# Patient Record
Sex: Female | Born: 1975
Health system: Southern US, Community
[De-identification: ages and names within clinical notes are randomized; demographics above are authoritative.]

## PROBLEM LIST (undated history)

## (undated) DIAGNOSIS — K648 Other hemorrhoids: Secondary | ICD-10-CM

## (undated) DIAGNOSIS — E669 Obesity, unspecified: Secondary | ICD-10-CM

## (undated) DIAGNOSIS — D219 Benign neoplasm of connective and other soft tissue, unspecified: Secondary | ICD-10-CM

## (undated) DIAGNOSIS — E119 Type 2 diabetes mellitus without complications: Secondary | ICD-10-CM

## (undated) DIAGNOSIS — R519 Headache, unspecified: Secondary | ICD-10-CM

## (undated) DIAGNOSIS — I10 Essential (primary) hypertension: Secondary | ICD-10-CM

## (undated) DIAGNOSIS — D649 Anemia, unspecified: Secondary | ICD-10-CM

## (undated) DIAGNOSIS — M1990 Unspecified osteoarthritis, unspecified site: Secondary | ICD-10-CM

## (undated) DIAGNOSIS — E785 Hyperlipidemia, unspecified: Secondary | ICD-10-CM

## (undated) DIAGNOSIS — G4730 Sleep apnea, unspecified: Secondary | ICD-10-CM

## (undated) DIAGNOSIS — E539 Vitamin B deficiency, unspecified: Secondary | ICD-10-CM

## (undated) DIAGNOSIS — L509 Urticaria, unspecified: Secondary | ICD-10-CM

## (undated) DIAGNOSIS — K219 Gastro-esophageal reflux disease without esophagitis: Secondary | ICD-10-CM

## (undated) HISTORY — DX: Hyperlipidemia, unspecified: E78.5

## (undated) HISTORY — DX: Essential (primary) hypertension: I10

## (undated) HISTORY — DX: Obesity, unspecified: E66.9

## (undated) HISTORY — DX: Urticaria, unspecified: L50.9

## (undated) HISTORY — DX: Benign neoplasm of connective and other soft tissue, unspecified: D21.9

## (undated) HISTORY — DX: Anemia, unspecified: D64.9

## (undated) HISTORY — PX: OTHER SURGICAL HISTORY: SHX169

## (undated) HISTORY — PX: TUBAL LIGATION: SHX77

## (undated) HISTORY — DX: Sleep apnea, unspecified: G47.30

---

## 1998-12-20 HISTORY — PX: OTHER SURGICAL HISTORY: SHX169

## 2000-11-18 ENCOUNTER — Other Ambulatory Visit: Admission: RE | Admit: 2000-11-18 | Discharge: 2000-11-18 | Payer: Self-pay | Admitting: Obstetrics & Gynecology

## 2000-11-19 HISTORY — PX: DILATION AND CURETTAGE OF UTERUS: SHX78

## 2000-11-26 ENCOUNTER — Ambulatory Visit (HOSPITAL_COMMUNITY): Admission: RE | Admit: 2000-11-26 | Discharge: 2000-11-26 | Payer: Self-pay | Admitting: Obstetrics and Gynecology

## 2001-07-12 ENCOUNTER — Other Ambulatory Visit: Admission: RE | Admit: 2001-07-12 | Discharge: 2001-07-12 | Payer: Self-pay | Admitting: Obstetrics and Gynecology

## 2001-09-27 ENCOUNTER — Inpatient Hospital Stay (HOSPITAL_COMMUNITY): Admission: AD | Admit: 2001-09-27 | Discharge: 2001-09-27 | Payer: Self-pay | Admitting: Obstetrics and Gynecology

## 2002-02-03 ENCOUNTER — Inpatient Hospital Stay (HOSPITAL_COMMUNITY): Admission: AD | Admit: 2002-02-03 | Discharge: 2002-02-03 | Payer: Self-pay | Admitting: Obstetrics and Gynecology

## 2002-03-10 ENCOUNTER — Inpatient Hospital Stay (HOSPITAL_COMMUNITY): Admission: AD | Admit: 2002-03-10 | Discharge: 2002-03-12 | Payer: Self-pay | Admitting: Obstetrics and Gynecology

## 2002-12-20 HISTORY — PX: TUBAL LIGATION: SHX77

## 2003-05-30 ENCOUNTER — Other Ambulatory Visit: Admission: RE | Admit: 2003-05-30 | Discharge: 2003-05-30 | Payer: Self-pay | Admitting: Obstetrics and Gynecology

## 2003-12-07 ENCOUNTER — Inpatient Hospital Stay (HOSPITAL_COMMUNITY): Admission: AD | Admit: 2003-12-07 | Discharge: 2003-12-09 | Payer: Self-pay | Admitting: Obstetrics and Gynecology

## 2003-12-11 ENCOUNTER — Inpatient Hospital Stay (HOSPITAL_COMMUNITY): Admission: AD | Admit: 2003-12-11 | Discharge: 2003-12-11 | Payer: Self-pay | Admitting: Obstetrics and Gynecology

## 2004-01-18 ENCOUNTER — Ambulatory Visit (HOSPITAL_COMMUNITY): Admission: RE | Admit: 2004-01-18 | Discharge: 2004-01-18 | Payer: Self-pay | Admitting: Obstetrics and Gynecology

## 2006-02-16 ENCOUNTER — Other Ambulatory Visit: Admission: RE | Admit: 2006-02-16 | Discharge: 2006-02-16 | Payer: Self-pay | Admitting: Obstetrics and Gynecology

## 2012-03-29 ENCOUNTER — Encounter (INDEPENDENT_AMBULATORY_CARE_PROVIDER_SITE_OTHER): Payer: Self-pay | Admitting: Surgery

## 2012-04-19 ENCOUNTER — Encounter (INDEPENDENT_AMBULATORY_CARE_PROVIDER_SITE_OTHER): Payer: Self-pay | Admitting: Surgery

## 2012-04-20 ENCOUNTER — Ambulatory Visit (INDEPENDENT_AMBULATORY_CARE_PROVIDER_SITE_OTHER): Payer: 59 | Admitting: Surgery

## 2012-04-20 ENCOUNTER — Encounter (INDEPENDENT_AMBULATORY_CARE_PROVIDER_SITE_OTHER): Payer: Self-pay | Admitting: Surgery

## 2012-04-20 VITALS — BP 123/82 | HR 74 | Temp 98.6°F | Resp 18 | Ht 64.0 in | Wt 187.2 lb

## 2012-04-20 DIAGNOSIS — K429 Umbilical hernia without obstruction or gangrene: Secondary | ICD-10-CM

## 2012-04-20 NOTE — Progress Notes (Signed)
Patient ID: Samantha Ball, female   DOB: Nov 02, 1976, 37 y.o.   MRN: [id number]  Chief Complaint  Patient presents with  . Umbilical Hernia    HPI Samantha Ball is a 36 y.o. female.   HPIThis is a very pleasant female referred by Dr. Parke Simmers for evaluation of a symptomatic umbilical hernia. She has had a hernia for many years. It is now getting larger and causing her to have increasing discomfort as well as nausea. She may have had an episode of vomiting as well. She does report that easily reduces. The pain is moderate in intensity. She has no complaints  Past Medical History  Diagnosis Date  . Hypertension   . Hyperlipidemia     Past Surgical History  Procedure Date  . Tubal ligation   . Wisdom teeth 2000    History reviewed. No pertinent family history.  Social History History  Substance Use Topics  . Smoking status: Never Smoker   . Smokeless tobacco: Not on file  . Alcohol Use: No    No Known Allergies  Current Outpatient Prescriptions  Medication Sig Dispense Refill  . Acetaminophen (TYLENOL 8 HOUR PO) Take by mouth.      Marland Kitchen aspirin-acetaminophen-caffeine (EXCEDRIN MIGRAINE) 250-250-65 MG per tablet Take 1 tablet by mouth every 6 (six) hours as needed.      . cholecalciferol (VITAMIN D) 400 UNITS TABS Take 5,000 Units by mouth.      . EXFORGE HCT 5-160-12.5 MG TABS       . Ibuprofen (MOTRIN PO) Take by mouth.      . valsartan-hydrochlorothiazide (DIOVAN-HCT) 80-12.5 MG per tablet Take 1 tablet by mouth daily. 1/2 daily      . Vitamin D, Ergocalciferol, (DRISDOL) 50000 UNITS CAPS         Review of Systems Review of Systems  Constitutional: Negative for fever, chills and unexpected weight change.  HENT: Negative for hearing loss, congestion, sore throat, trouble swallowing and voice change.   Eyes: Negative for visual disturbance.  Respiratory: Negative for cough and wheezing.   Cardiovascular: Negative for chest pain, palpitations and leg swelling.    Gastrointestinal: Positive for nausea, abdominal pain and constipation. Negative for vomiting, diarrhea, blood in stool, abdominal distention and anal bleeding.  Genitourinary: Negative for hematuria, vaginal bleeding and difficulty urinating.  Musculoskeletal: Negative for arthralgias.  Skin: Negative for rash and wound.  Neurological: Negative for seizures, syncope and headaches.  Hematological: Negative for adenopathy. Does not bruise/bleed easily.  Psychiatric/Behavioral: Negative for confusion.    Blood pressure 123/82, pulse 74, temperature 98.6 F (37 C), temperature source Temporal, resp. rate 18, height 5\' 4"  (1.626 m), weight 187 lb 3.2 oz (84.913 kg).  Physical Exam Physical Exam  Constitutional: She is oriented to person, place, and time. She appears well-developed and well-nourished. No distress.  HENT:  Head: Normocephalic and atraumatic.  Right Ear: External ear normal.  Left Ear: External ear normal.  Nose: Nose normal.  Mouth/Throat: Oropharynx is clear and moist. No oropharyngeal exudate.  Eyes: Conjunctivae are normal. Pupils are equal, round, and reactive to light. Right eye exhibits no discharge. Left eye exhibits no discharge. No scleral icterus.  Neck: Normal range of motion. Neck supple. No tracheal deviation present. No thyromegaly present.  Cardiovascular: Normal rate, regular rhythm, normal heart sounds and intact distal pulses.   No murmur heard. Pulmonary/Chest: Effort normal and breath sounds normal. No respiratory distress. She has no wheezes.  Abdominal: Soft. Bowel sounds are normal. She exhibits no  distension. There is no tenderness. There is no rebound.       There is a well-healed incision just below the umbilicus. There is a reducible hernia at the umbilicus.  Musculoskeletal: Normal range of motion. She exhibits no edema and no tenderness.  Lymphadenopathy:    She has no cervical adenopathy.  Neurological: She is alert and oriented to person,  place, and time.  Skin: Skin is warm and dry. No rash noted. She is not diaphoretic. No erythema.  Psychiatric: Her behavior is normal. Judgment normal.    Data Reviewed   Assessment    Umbilical hernia    Plan    Repair with mesh was recommended. I discussed this with her in detail. I discussed the risk of surgery which includes but is not limited to bleeding, infection, recurrence, need for further surgery, chronic pain, et Karie Soda. She understands and wishes to proceed. Likelihood of success is good.       Consuelo Thayne A 04/20/2012, 1:41 PM

## 2012-04-26 ENCOUNTER — Encounter (INDEPENDENT_AMBULATORY_CARE_PROVIDER_SITE_OTHER): Payer: Self-pay

## 2012-06-16 ENCOUNTER — Encounter (HOSPITAL_COMMUNITY): Admission: RE | Payer: Self-pay | Source: Ambulatory Visit

## 2012-06-16 ENCOUNTER — Ambulatory Visit (HOSPITAL_COMMUNITY): Admission: RE | Admit: 2012-06-16 | Payer: 59 | Source: Ambulatory Visit | Admitting: Surgery

## 2012-06-16 SURGERY — REPAIR, HERNIA, UMBILICAL, ADULT
Anesthesia: General

## 2012-07-11 ENCOUNTER — Encounter (INDEPENDENT_AMBULATORY_CARE_PROVIDER_SITE_OTHER): Payer: 59 | Admitting: Surgery

## 2012-07-25 ENCOUNTER — Encounter (INDEPENDENT_AMBULATORY_CARE_PROVIDER_SITE_OTHER): Payer: 59 | Admitting: Surgery

## 2012-11-02 LAB — HM PAP SMEAR: HM Pap smear: NORMAL

## 2012-11-20 ENCOUNTER — Other Ambulatory Visit: Payer: Self-pay | Admitting: Family Medicine

## 2012-11-20 ENCOUNTER — Ambulatory Visit
Admission: RE | Admit: 2012-11-20 | Discharge: 2012-11-20 | Disposition: A | Discharge status: Home / Self Care | Payer: 59 | Source: Ambulatory Visit | Attending: Family Medicine | Admitting: Family Medicine

## 2012-11-20 DIAGNOSIS — M1990 Unspecified osteoarthritis, unspecified site: Secondary | ICD-10-CM

## 2012-12-04 ENCOUNTER — Ambulatory Visit: Payer: Self-pay | Admitting: Obstetrics and Gynecology

## 2013-01-23 ENCOUNTER — Encounter: Payer: Self-pay | Admitting: Obstetrics and Gynecology

## 2013-01-23 ENCOUNTER — Ambulatory Visit: Payer: 59 | Admitting: Obstetrics and Gynecology

## 2013-01-23 VITALS — BP 110/58 | Ht 63.75 in | Wt 190.0 lb

## 2013-01-23 DIAGNOSIS — Z124 Encounter for screening for malignant neoplasm of cervix: Secondary | ICD-10-CM

## 2013-01-23 DIAGNOSIS — R351 Nocturia: Secondary | ICD-10-CM

## 2013-01-23 DIAGNOSIS — Z01419 Encounter for gynecological examination (general) (routine) without abnormal findings: Secondary | ICD-10-CM

## 2013-01-23 LAB — POCT URINALYSIS DIPSTICK
Bilirubin, UA: NEGATIVE
Blood, UA: NEGATIVE
Glucose, UA: NEGATIVE
Ketones, UA: NEGATIVE
Leukocytes, UA: NEGATIVE
Nitrite, UA: NEGATIVE
Protein, UA: NEGATIVE
Spec Grav, UA: <=1.005
Urobilinogen, UA: NEGATIVE
pH, UA: 8

## 2013-01-23 MED ORDER — SULFAMETHOXAZOLE-TRIMETHOPRIM 800-160 MG PO TABS: 1.0000 | ORAL_TABLET | Freq: Two times a day (BID) | ORAL | Status: AC

## 2013-01-23 NOTE — Progress Notes (Signed)
ANNUAL GYNECOLOGIC EXAMINATION   Samantha Ball is a 37 y.o. female, G3P2, who presents for an annual exam. The patient has not been seen since 2010 and therefore this was considered a new patient exam.  She is status post tubal ligation.  She says she has nocturia.    History   Social History  . Marital Status: Married    Spouse Name: N/A    Number of Children: N/A  . Years of Education: N/A   Social History Main Topics  . Smoking status: Never Smoker   . Smokeless tobacco: Never Used  . Alcohol Use: No  . Drug Use: No  . Sexually Active: Yes    Birth Control/ Protection: Surgical     Comment: BTL   Other Topics Concern  . None   Social History Narrative  . None    Menstrual cycle:   LMP: Patient's last menstrual period was 01/04/2013.             The following portions of the patient's history were reviewed and updated as appropriate: allergies, current medications, past family history, past medical history, past social history, past surgical history and problem list.  Review of Systems Pertinent items are noted in HPI. Breast:Negative for breast lump,nipple discharge or nipple retraction Gastrointestinal: Negative for abdominal pain, change in bowel habits or rectal bleeding Urinary: nocturia   Objective:    BP 110/58  Ht 5' 3.75" (1.619 m)  Wt 190 lb (86.183 kg)  BMI 32.87 kg/m2  LMP 01/04/2013    Weight:  Wt Readings from Last 1 Encounters:  01/23/13 190 lb (86.183 kg)          BMI: Body mass index is 32.87 kg/(m^2).  General Appearance: Alert, appropriate appearance for age. No acute distress HEENT: Grossly normal Neck / Thyroid: Supple, no masses, nodes or enlargement Lungs: clear to auscultation bilaterally Back: No CVA tenderness Breast Exam: No masses or nodes.No dimpling, nipple retraction or discharge. Cardiovascular: Regular rate and rhythm. S1, S2, no murmur Gastrointestinal: Soft, non-tender, no masses or  organomegaly  ++++++++++++++++++++++++++++++++++++++++++++++++++++++++  Pelvic Exam: External genitalia: normal general appearance Vaginal: normal without tenderness, induration or masses. Relaxation: Yes Cervix: normal appearance Adnexa: normal bimanual exam Uterus: normal size, shape, and consistency Rectovaginal: normal rectal, no masses  ++++++++++++++++++++++++++++++++++++++++++++++++++++++++  Lymphatic Exam: Non-palpable nodes in neck, clavicular, axillary, or inguinal regions Neurologic: Normal speech, no tremor  Psychiatric: Alert and oriented, appropriate affect.   Urinalysis:  Assessment:    Normal gyn exam   Overweight or obese: Yes   Pelvic relaxation: Yes  nocturia   Plan:    pap smear return annually or prn Contraception:bilateral tubal ligation    Medications prescribed: Septra DS 1 tablet twice a day for 3 days  Urine culture sent  STD screen request: No   The updated Pap smear screening guidelines were discussed with the patient. The patient requested that I obtain a Pap smear: Yes.  Kegel exercises discussed: Yes.  Proper diet and regular exercise were reviewed.  Annual mammograms recommended starting at age 88. Proper breast care was discussed.  Screening colonoscopy is recommended beginning at age 12.  Regular health maintenance was reviewed.  Sleep hygiene was discussed.  Adequate calcium and vitamin D intake was emphasized.  Leonard Schwartz M.D.    Regular Periods: yes Mammogram: no  Monthly Breast Ex.: no Exercise: yes  Tetanus < 10 years: no Seatbelts: yes  NI. Bladder Functn.: yes Abuse at home: no  Daily BM's: yes Stressful  Work: no  Healthy Diet: no Sigmoid-Colonoscopy: per pt 2008 Normal  Calcium: no Medical problems this year: none    LAST PAP:10/15/2009 Contraception: BTL   Mammogram:  n/a  PCP: Dr Renaye Rakers   PMH: none   FMH: none   Last Bone Scan: none

## 2013-01-24 LAB — PAP IG W/ RFLX HPV ASCU

## 2013-01-25 LAB — URINE CULTURE
Colony Count: NO GROWTH
Organism ID, Bacteria: NO GROWTH

## 2013-07-11 ENCOUNTER — Ambulatory Visit (INDEPENDENT_AMBULATORY_CARE_PROVIDER_SITE_OTHER): Payer: 59

## 2013-07-11 ENCOUNTER — Encounter: Payer: Self-pay | Admitting: Internal Medicine

## 2013-07-11 ENCOUNTER — Ambulatory Visit (INDEPENDENT_AMBULATORY_CARE_PROVIDER_SITE_OTHER): Payer: 59 | Admitting: Internal Medicine

## 2013-07-11 VITALS — BP 140/96 | HR 73 | Temp 98.2°F | Resp 16 | Ht 64.0 in | Wt 193.0 lb

## 2013-07-11 DIAGNOSIS — M2550 Pain in unspecified joint: Secondary | ICD-10-CM

## 2013-07-11 DIAGNOSIS — E669 Obesity, unspecified: Secondary | ICD-10-CM | POA: Insufficient documentation

## 2013-07-11 DIAGNOSIS — Z0000 Encounter for general adult medical examination without abnormal findings: Secondary | ICD-10-CM

## 2013-07-11 DIAGNOSIS — IMO0001 Reserved for inherently not codable concepts without codable children: Secondary | ICD-10-CM | POA: Insufficient documentation

## 2013-07-11 DIAGNOSIS — I10 Essential (primary) hypertension: Secondary | ICD-10-CM | POA: Insufficient documentation

## 2013-07-11 DIAGNOSIS — Z1231 Encounter for screening mammogram for malignant neoplasm of breast: Secondary | ICD-10-CM | POA: Insufficient documentation

## 2013-07-11 LAB — COMPREHENSIVE METABOLIC PANEL
ALT: 18 U/L (ref 0–35)
AST: 18 U/L (ref 0–37)
Albumin: 4.1 g/dL (ref 3.5–5.2)
Alkaline Phosphatase: 41 U/L (ref 39–117)
BUN: 9 mg/dL (ref 6–23)
CO2: 24 mEq/L (ref 19–32)
Calcium: 9.9 mg/dL (ref 8.4–10.5)
Chloride: 106 mEq/L (ref 96–112)
Creatinine, Ser: 0.8 mg/dL (ref 0.4–1.2)
GFR: 103.45 mL/min (ref >60.00)
Glucose, Bld: 85 mg/dL (ref 70–99)
Potassium: 4 mEq/L (ref 3.5–5.1)
Sodium: 137 mEq/L (ref 135–145)
Total Bilirubin: 0.6 mg/dL (ref 0.3–1.2)
Total Protein: 7.9 g/dL (ref 6.0–8.3)

## 2013-07-11 LAB — LIPID PANEL
Cholesterol: 180 mg/dL (ref 0–200)
HDL: 51.8 mg/dL (ref >39.00)
LDL Cholesterol: 115 mg/dL — ABNORMAL HIGH (ref 0–99)
Total CHOL/HDL Ratio: 3
Triglycerides: 67 mg/dL (ref 0.0–149.0)
VLDL: 13.4 mg/dL (ref 0.0–40.0)

## 2013-07-11 LAB — CBC WITH DIFFERENTIAL/PLATELET
Basophils Absolute: 0 10*3/uL (ref 0.0–0.1)
Basophils Relative: 0.5 % (ref 0.0–3.0)
Eosinophils Absolute: 0.1 10*3/uL (ref 0.0–0.7)
Eosinophils Relative: 1.5 % (ref 0.0–5.0)
HCT: 36 % (ref 36.0–46.0)
Hemoglobin: 12 g/dL (ref 12.0–15.0)
Lymphocytes Relative: 38.5 % (ref 12.0–46.0)
Lymphs Abs: 2.2 10*3/uL (ref 0.7–4.0)
MCHC: 33.4 g/dL (ref 30.0–36.0)
MCV: 82 fl (ref 78.0–100.0)
Monocytes Absolute: 0.4 10*3/uL (ref 0.1–1.0)
Monocytes Relative: 7.4 % (ref 3.0–12.0)
Neutro Abs: 3 10*3/uL (ref 1.4–7.7)
Neutrophils Relative %: 52.1 % (ref 43.0–77.0)
Platelets: 302 10*3/uL (ref 150.0–400.0)
RBC: 4.39 Mil/uL (ref 3.87–5.11)
RDW: 17 % — ABNORMAL HIGH (ref 11.5–14.6)
WBC: 5.8 10*3/uL (ref 4.5–10.5)

## 2013-07-11 LAB — C-REACTIVE PROTEIN: CRP: <0.5 mg/dL (ref 0.5–20.0)

## 2013-07-11 LAB — TSH: TSH: 1.01 u[IU]/mL (ref 0.35–5.50)

## 2013-07-11 LAB — RHEUMATOID FACTOR: Rhuematoid fact SerPl-aCnc: <10 IU/mL (ref <=14)

## 2013-07-11 LAB — SEDIMENTATION RATE: Sed Rate: 16 mm/hr (ref 0–22)

## 2013-07-11 MED ORDER — HYDROCHLOROTHIAZIDE 12.5 MG PO CAPS: 12.5000 mg | ORAL_CAPSULE | Freq: Every day | ORAL | Status: DC

## 2013-07-11 NOTE — Assessment & Plan Note (Signed)
Exam done Vaccines were reviewed Labs ordered Pt ed material was given 

## 2013-07-11 NOTE — Assessment & Plan Note (Signed)
She is working on her lifestyle modifications to lose weight. 

## 2013-07-11 NOTE — Progress Notes (Signed)
  Subjective:    Patient ID: Samantha Ball, female    DOB: [birth year], 37 y.o.   MRN: [id number]  HPI Comments: New to me, transfer [facility] - no records are available today.  Arthritis Presents for follow-up visit. She complains of pain. She reports no stiffness, joint swelling or joint warmth. The symptoms have been stable. Affected locations include the right wrist, left wrist, left MCP, right MCP, right knee, left knee, left ankle and right ankle. Her pain is at a severity of 2/10. Pertinent negatives include no diarrhea, dry eyes, dry mouth, dysuria, fatigue, fever, pain at night, pain while resting, rash, Raynaud's syndrome, uveitis or weight loss.      Review of Systems  Constitutional: Negative.  Negative for fever, chills, weight loss, diaphoresis, activity change, appetite change, fatigue and unexpected weight change.  HENT: Negative.   Eyes: Negative.   Respiratory: Negative.  Negative for cough, chest tightness, shortness of breath, wheezing and stridor.   Cardiovascular: Negative.  Negative for chest pain, palpitations and leg swelling.  Gastrointestinal: Positive for constipation. Negative for nausea, vomiting, abdominal pain and diarrhea.  Endocrine: Negative.   Genitourinary: Negative.  Negative for dysuria.  Musculoskeletal: Positive for arthritis. Negative for myalgias, back pain, joint swelling, gait problem and stiffness.  Skin: Negative.  Negative for rash.  Allergic/Immunologic: Negative.   Neurological: Negative.   Hematological: Negative.  Negative for adenopathy. Does not bruise/bleed easily.  Psychiatric/Behavioral: Negative.        Objective:   Physical Exam  Vitals reviewed. Constitutional: She is oriented to person, place, and time. She appears well-developed and well-nourished. No distress.  HENT:  Head: Normocephalic and atraumatic.  Mouth/Throat: Oropharynx is clear and moist. No oropharyngeal exudate.  Eyes: Conjunctivae are normal. Right  eye exhibits no discharge. Left eye exhibits no discharge. No scleral icterus.  Neck: Normal range of motion. Neck supple. No JVD present. No tracheal deviation present. No thyromegaly present.  Cardiovascular: Normal rate, regular rhythm, normal heart sounds and intact distal pulses.  Exam reveals no gallop and no friction rub.   No murmur heard. Pulmonary/Chest: Effort normal and breath sounds normal. No stridor. No respiratory distress. She has no wheezes. She has no rales. She exhibits no tenderness.  Abdominal: Soft. Bowel sounds are normal. She exhibits no distension and no mass. There is no tenderness. There is no rebound and no guarding.  Musculoskeletal: Normal range of motion. She exhibits no edema and no tenderness.  There is mild warmth over both wrists but no effusions or swelling. All other joints are cool with FROM and no swelling, crepitance, warmth.  Lymphadenopathy:    She has no cervical adenopathy.  Neurological: She is oriented to person, place, and time.  Skin: Skin is warm and dry. No rash noted. She is not diaphoretic. No erythema. No pallor.  Psychiatric: She has a normal mood and affect. Her behavior is normal. Judgment and thought content normal.    No results found for this basename: WBC, HGB, HCT, PLT, GLUCOSE, CHOL, TRIG, HDL, LDLDIRECT, LDLCALC, ALT, AST, NA, K, CL, CREATININE, BUN, CO2, TSH, PSA, INR, GLUF, HGBA1C, MICROALBUR        Assessment & Plan:

## 2013-07-11 NOTE — Patient Instructions (Signed)
Preventive Care for Adults, Female A healthy lifestyle and preventive care can promote health and wellness. Preventive health guidelines for women include the following key practices.  A routine yearly physical is a good way to check with your caregiver about your health and preventive screening. It is a chance to share any concerns and updates on your health, and to receive a thorough exam.  Visit your dentist for a routine exam and preventive care every 6 months. Brush your teeth twice a day and floss once a day. Good oral hygiene prevents tooth decay and gum disease.  The frequency of eye exams is based on your age, health, family medical history, use of contact lenses, and other factors. Follow your caregiver's recommendations for frequency of eye exams.  Eat a healthy diet. Foods like vegetables, fruits, whole grains, low-fat dairy products, and lean protein foods contain the nutrients you need without too many calories. Decrease your intake of foods high in solid fats, added sugars, and salt. Eat the right amount of calories for you.Get information about a proper diet from your caregiver, if necessary.  Regular physical exercise is one of the most important things you can do for your health. Most adults should get at least 150 minutes of moderate-intensity exercise (any activity that increases your heart rate and causes you to sweat) each week. In addition, most adults need muscle-strengthening exercises on 2 or more days a week.  Maintain a healthy weight. The body mass index (BMI) is a screening tool to identify possible weight problems. It provides an estimate of body fat based on height and weight. Your caregiver can help determine your BMI, and can help you achieve or maintain a healthy weight.For adults 20 years and older:  A BMI below 18.5 is considered underweight.  A BMI of 18.5 to 24.9 is normal.  A BMI of 25 to 29.9 is considered overweight.  A BMI of 30 and above is  considered obese.  Maintain normal blood lipids and cholesterol levels by exercising and minimizing your intake of saturated fat. Eat a balanced diet with plenty of fruit and vegetables. Blood tests for lipids and cholesterol should begin at age 20 and be repeated every 5 years. If your lipid or cholesterol levels are high, you are over 50, or you are at high risk for heart disease, you may need your cholesterol levels checked more frequently.Ongoing high lipid and cholesterol levels should be treated with medicines if diet and exercise are not effective.  If you smoke, find out from your caregiver how to quit. If you do not use tobacco, do not start.  If you are pregnant, do not drink alcohol. If you are breastfeeding, be very cautious about drinking alcohol. If you are not pregnant and choose to drink alcohol, do not exceed 1 drink per day. One drink is considered to be 12 ounces (355 mL) of beer, 5 ounces (148 mL) of wine, or 1.5 ounces (44 mL) of liquor.  Avoid use of street drugs. Do not share needles with anyone. Ask for help if you need support or instructions about stopping the use of drugs.  High blood pressure causes heart disease and increases the risk of stroke. Your blood pressure should be checked at least every 1 to 2 years. Ongoing high blood pressure should be treated with medicines if weight loss and exercise are not effective.  If you are 55 to 38 years old, ask your caregiver if you should take aspirin to prevent strokes.  Diabetes   screening involves taking a blood sample to check your fasting blood sugar level. This should be done once every 3 years, after age 45, if you are within normal weight and without risk factors for diabetes. Testing should be considered at a younger age or be carried out more frequently if you are overweight and have at least 1 risk factor for diabetes.  Breast cancer screening is essential preventive care for women. You should practice "breast  self-awareness." This means understanding the normal appearance and feel of your breasts and may include breast self-examination. Any changes detected, no matter how small, should be reported to a caregiver. Women in their 20s and 30s should have a clinical breast exam (CBE) by a caregiver as part of a regular health exam every 1 to 3 years. After age 40, women should have a CBE every year. Starting at age 40, women should consider having a mammography (breast X-ray test) every year. Women who have a family history of breast cancer should talk to their caregiver about genetic screening. Women at a high risk of breast cancer should talk to their caregivers about having magnetic resonance imaging (MRI) and a mammography every year.  The Pap test is a screening test for cervical cancer. A Pap test can show cell changes on the cervix that might become cervical cancer if left untreated. A Pap test is a procedure in which cells are obtained and examined from the lower end of the uterus (cervix).  Women should have a Pap test starting at age 21.  Between ages 21 and 29, Pap tests should be repeated every 2 years.  Beginning at age 30, you should have a Pap test every 3 years as long as the past 3 Pap tests have been normal.  Some women have medical problems that increase the chance of getting cervical cancer. Talk to your caregiver about these problems. It is especially important to talk to your caregiver if a new problem develops soon after your last Pap test. In these cases, your caregiver may recommend more frequent screening and Pap tests.  The above recommendations are the same for women who have or have not gotten the vaccine for human papillomavirus (HPV).  If you had a hysterectomy for a problem that was not cancer or a condition that could lead to cancer, then you no longer need Pap tests. Even if you no longer need a Pap test, a regular exam is a good idea to make sure no other problems are  starting.  If you are between ages 65 and 70, and you have had normal Pap tests going back 10 years, you no longer need Pap tests. Even if you no longer need a Pap test, a regular exam is a good idea to make sure no other problems are starting.  If you have had past treatment for cervical cancer or a condition that could lead to cancer, you need Pap tests and screening for cancer for at least 20 years after your treatment.  If Pap tests have been discontinued, risk factors (such as a new sexual partner) need to be reassessed to determine if screening should be resumed.  The HPV test is an additional test that may be used for cervical cancer screening. The HPV test looks for the virus that can cause the cell changes on the cervix. The cells collected during the Pap test can be tested for HPV. The HPV test could be used to screen women aged 30 years and older, and should   be used in women of any age who have unclear Pap test results. After the age of 30, women should have HPV testing at the same frequency as a Pap test.  Colorectal cancer can be detected and often prevented. Most routine colorectal cancer screening begins at the age of 50 and continues through age 75. However, your caregiver may recommend screening at an earlier age if you have risk factors for colon cancer. On a yearly basis, your caregiver may provide home test kits to check for hidden blood in the stool. Use of a small camera at the end of a tube, to directly examine the colon (sigmoidoscopy or colonoscopy), can detect the earliest forms of colorectal cancer. Talk to your caregiver about this at age 50, when routine screening begins. Direct examination of the colon should be repeated every 5 to 10 years through age 75, unless early forms of pre-cancerous polyps or small growths are found.  Hepatitis C blood testing is recommended for all people born from 1945 through 1965 and any individual with known risks for hepatitis C.  Practice  safe sex. Use condoms and avoid high-risk sexual practices to reduce the spread of sexually transmitted infections (STIs). STIs include gonorrhea, chlamydia, syphilis, trichomonas, herpes, HPV, and human immunodeficiency virus (HIV). Herpes, HIV, and HPV are viral illnesses that have no cure. They can result in disability, cancer, and death. Sexually active women aged 25 and younger should be checked for chlamydia. Older women with new or multiple partners should also be tested for chlamydia. Testing for other STIs is recommended if you are sexually active and at increased risk.  Osteoporosis is a disease in which the bones lose minerals and strength with aging. This can result in serious bone fractures. The risk of osteoporosis can be identified using a bone density scan. Women ages 65 and over and women at risk for fractures or osteoporosis should discuss screening with their caregivers. Ask your caregiver whether you should take a calcium supplement or vitamin D to reduce the rate of osteoporosis.  Menopause can be associated with physical symptoms and risks. Hormone replacement therapy is available to decrease symptoms and risks. You should talk to your caregiver about whether hormone replacement therapy is right for you.  Use sunscreen with sun protection factor (SPF) of 30 or more. Apply sunscreen liberally and repeatedly throughout the day. You should seek shade when your shadow is shorter than you. Protect yourself by wearing long sleeves, pants, a wide-brimmed hat, and sunglasses year round, whenever you are outdoors.  Once a month, do a whole body skin exam, using a mirror to look at the skin on your back. Notify your caregiver of new moles, moles that have irregular borders, moles that are larger than a pencil eraser, or moles that have changed in shape or color.  Stay current with required immunizations.  Influenza. You need a dose every fall (or winter). The composition of the flu vaccine  changes each year, so being vaccinated once is not enough.  Pneumococcal polysaccharide. You need 1 to 2 doses if you smoke cigarettes or if you have certain chronic medical conditions. You need 1 dose at age 65 (or older) if you have never been vaccinated.  Tetanus, diphtheria, pertussis (Tdap, Td). Get 1 dose of Tdap vaccine if you are younger than age 65, are over 65 and have contact with an infant, are a healthcare worker, are pregnant, or simply want to be protected from whooping cough. After that, you need a Td   booster dose every 10 years. Consult your caregiver if you have not had at least 3 tetanus and diphtheria-containing shots sometime in your life or have a deep or dirty wound.  HPV. You need this vaccine if you are a woman age 26 or younger. The vaccine is given in 3 doses over 6 months.  Measles, mumps, rubella (MMR). You need at least 1 dose of MMR if you were born in 1957 or later. You may also need a second dose.  Meningococcal. If you are age 19 to 21 and a first-year college student living in a residence hall, or have one of several medical conditions, you need to get vaccinated against meningococcal disease. You may also need additional booster doses.  Zoster (shingles). If you are age 60 or older, you should get this vaccine.  Varicella (chickenpox). If you have never had chickenpox or you were vaccinated but received only 1 dose, talk to your caregiver to find out if you need this vaccine.  Hepatitis A. You need this vaccine if you have a specific risk factor for hepatitis A virus infection or you simply wish to be protected from this disease. The vaccine is usually given as 2 doses, 6 to 18 months apart.  Hepatitis B. You need this vaccine if you have a specific risk factor for hepatitis B virus infection or you simply wish to be protected from this disease. The vaccine is given in 3 doses, usually over 6 months. Preventive Services / Frequency Ages 19 to 39  Blood  pressure check.** / Every 1 to 2 years.  Lipid and cholesterol check.** / Every 5 years beginning at age 20.  Clinical breast exam.** / Every 3 years for women in their 20s and 30s.  Pap test.** / Every 2 years from ages 21 through 29. Every 3 years starting at age 30 through age 65 or 70 with a history of 3 consecutive normal Pap tests.  HPV screening.** / Every 3 years from ages 30 through ages 65 to 70 with a history of 3 consecutive normal Pap tests.  Hepatitis C blood test.** / For any individual with known risks for hepatitis C.  Skin self-exam. / Monthly.  Influenza immunization.** / Every year.  Pneumococcal polysaccharide immunization.** / 1 to 2 doses if you smoke cigarettes or if you have certain chronic medical conditions.  Tetanus, diphtheria, pertussis (Tdap, Td) immunization. / A one-time dose of Tdap vaccine. After that, you need a Td booster dose every 10 years.  HPV immunization. / 3 doses over 6 months, if you are 26 and younger.  Measles, mumps, rubella (MMR) immunization. / You need at least 1 dose of MMR if you were born in 1957 or later. You may also need a second dose.  Meningococcal immunization. / 1 dose if you are age 19 to 21 and a first-year college student living in a residence hall, or have one of several medical conditions, you need to get vaccinated against meningococcal disease. You may also need additional booster doses.  Varicella immunization.** / Consult your caregiver.  Hepatitis A immunization.** / Consult your caregiver. 2 doses, 6 to 18 months apart.  Hepatitis B immunization.** / Consult your caregiver. 3 doses usually over 6 months. Ages 40 to 64  Blood pressure check.** / Every 1 to 2 years.  Lipid and cholesterol check.** / Every 5 years beginning at age 20.  Clinical breast exam.** / Every year after age 40.  Mammogram.** / Every year beginning at age 40   and continuing for as long as you are in good health. Consult with your  caregiver.  Pap test.** / Every 3 years starting at age 30 through age 65 or 70 with a history of 3 consecutive normal Pap tests.  HPV screening.** / Every 3 years from ages 30 through ages 65 to 70 with a history of 3 consecutive normal Pap tests.  Fecal occult blood test (FOBT) of stool. / Every year beginning at age 50 and continuing until age 75. You may not need to do this test if you get a colonoscopy every 10 years.  Flexible sigmoidoscopy or colonoscopy.** / Every 5 years for a flexible sigmoidoscopy or every 10 years for a colonoscopy beginning at age 50 and continuing until age 75.  Hepatitis C blood test.** / For all people born from 1945 through 1965 and any individual with known risks for hepatitis C.  Skin self-exam. / Monthly.  Influenza immunization.** / Every year.  Pneumococcal polysaccharide immunization.** / 1 to 2 doses if you smoke cigarettes or if you have certain chronic medical conditions.  Tetanus, diphtheria, pertussis (Tdap, Td) immunization.** / A one-time dose of Tdap vaccine. After that, you need a Td booster dose every 10 years.  Measles, mumps, rubella (MMR) immunization. / You need at least 1 dose of MMR if you were born in 1957 or later. You may also need a second dose.  Varicella immunization.** / Consult your caregiver.  Meningococcal immunization.** / Consult your caregiver.  Hepatitis A immunization.** / Consult your caregiver. 2 doses, 6 to 18 months apart.  Hepatitis B immunization.** / Consult your caregiver. 3 doses, usually over 6 months. Ages 65 and over  Blood pressure check.** / Every 1 to 2 years.  Lipid and cholesterol check.** / Every 5 years beginning at age 20.  Clinical breast exam.** / Every year after age 40.  Mammogram.** / Every year beginning at age 40 and continuing for as long as you are in good health. Consult with your caregiver.  Pap test.** / Every 3 years starting at age 30 through age 65 or 70 with a 3  consecutive normal Pap tests. Testing can be stopped between 65 and 70 with 3 consecutive normal Pap tests and no abnormal Pap or HPV tests in the past 10 years.  HPV screening.** / Every 3 years from ages 30 through ages 65 or 70 with a history of 3 consecutive normal Pap tests. Testing can be stopped between 65 and 70 with 3 consecutive normal Pap tests and no abnormal Pap or HPV tests in the past 10 years.  Fecal occult blood test (FOBT) of stool. / Every year beginning at age 50 and continuing until age 75. You may not need to do this test if you get a colonoscopy every 10 years.  Flexible sigmoidoscopy or colonoscopy.** / Every 5 years for a flexible sigmoidoscopy or every 10 years for a colonoscopy beginning at age 50 and continuing until age 75.  Hepatitis C blood test.** / For all people born from 1945 through 1965 and any individual with known risks for hepatitis C.  Osteoporosis screening.** / A one-time screening for women ages 65 and over and women at risk for fractures or osteoporosis.  Skin self-exam. / Monthly.  Influenza immunization.** / Every year.  Pneumococcal polysaccharide immunization.** / 1 dose at age 65 (or older) if you have never been vaccinated.  Tetanus, diphtheria, pertussis (Tdap, Td) immunization. / A one-time dose of Tdap vaccine if you are over   65 and have contact with an infant, are a healthcare worker, or simply want to be protected from whooping cough. After that, you need a Td booster dose every 10 years.  Varicella immunization.** / Consult your caregiver.  Meningococcal immunization.** / Consult your caregiver.  Hepatitis A immunization.** / Consult your caregiver. 2 doses, 6 to 18 months apart.  Hepatitis B immunization.** / Check with your caregiver. 3 doses, usually over 6 months. ** Family history and personal history of risk and conditions may change your caregiver's recommendations. Document Released: 02/01/2002 Document Revised: 02/28/2012  Document Reviewed: 05/03/2011 [employer] Patient Information 2014 [employer]. Hypertension As your heart beats, it forces blood through your arteries. This force is your blood pressure. If the pressure is too high, it is called hypertension (HTN) or high blood pressure. HTN is dangerous because you may have it and not know it. High blood pressure may mean that your heart has to work harder to pump blood. Your arteries may be narrow or stiff. The extra work puts you at risk for heart disease, stroke, and other problems.  Blood pressure consists of two numbers, a higher number over a lower, 110/72, for example. It is stated as "110 over 72." The ideal is below 120 for the top number (systolic) and under 80 for the bottom (diastolic). Write down your blood pressure today. You should pay close attention to your blood pressure if you have certain conditions such as:  Heart failure.  Prior heart attack.  Diabetes  Chronic kidney disease.  Prior stroke.  Multiple risk factors for heart disease. To see if you have HTN, your blood pressure should be measured while you are seated with your arm held at the level of the heart. It should be measured at least twice. A one-time elevated blood pressure reading (especially in the Emergency Department) does not mean that you need treatment. There may be conditions in which the blood pressure is different between your right and left arms. It is important to see your caregiver soon for a recheck. Most people have essential hypertension which means that there is not a specific cause. This type of high blood pressure may be lowered by changing lifestyle factors such as:  Stress.  Smoking.  Lack of exercise.  Excessive weight.  Drug/tobacco/alcohol use.  Eating less salt. Most people do not have symptoms from high blood pressure until it has caused damage to the body. Effective treatment can often prevent, delay or reduce that damage. TREATMENT  When a  cause has been identified, treatment for high blood pressure is directed at the cause. There are a large number of medications to treat HTN. These fall into several categories, and your caregiver will help you select the medicines that are best for you. Medications may have side effects. You should review side effects with your caregiver. If your blood pressure stays high after you have made lifestyle changes or started on medicines,   Your medication(s) may need to be changed.  Other problems may need to be addressed.  Be certain you understand your prescriptions, and know how and when to take your medicine.  Be sure to follow up with your caregiver within the time frame advised (usually within two weeks) to have your blood pressure rechecked and to review your medications.  If you are taking more than one medicine to lower your blood pressure, make sure you know how and at what times they should be taken. Taking two medicines at the same time can result   in blood pressure that is too low. SEEK IMMEDIATE MEDICAL CARE IF:  You develop a severe headache, blurred or changing vision, or confusion.  You have unusual weakness or numbness, or a faint feeling.  You have severe chest or abdominal pain, vomiting, or breathing problems. MAKE SURE YOU:   Understand these instructions.  Will watch your condition.  Will get help right away if you are not doing well or get worse. Document Released: 12/06/2005 Document Revised: 02/28/2012 Document Reviewed: 07/26/2008 [employer] Patient Information 2014 [employer].  

## 2013-07-11 NOTE — Assessment & Plan Note (Signed)
She had been on [id number] but she had constipation and felt like her BP was too low so she stopped that 2 weeks ago I will check her labs today to screen her for end organ damage and secondary causes of HTN Will treat the BP with HCTZ

## 2013-07-11 NOTE — Assessment & Plan Note (Signed)
She tells me that Dr. Parke Simmers did xrays and that they were normal, she does not think she has had an evaluation for connective tissue disease so today I will check her ANA, RF, ESR, and CRP

## 2013-07-12 ENCOUNTER — Encounter: Payer: Self-pay | Admitting: Internal Medicine

## 2013-07-12 LAB — ANTI-NUCLEAR AB-TITER (ANA TITER): ANA Titer 1: 1:80 {titer} — ABNORMAL HIGH

## 2013-07-12 LAB — ANA: Anti Nuclear Antibody(ANA): POSITIVE — AB

## 2013-07-16 ENCOUNTER — Telehealth: Payer: Self-pay | Admitting: *Deleted

## 2013-07-16 DIAGNOSIS — R768 Other specified abnormal immunological findings in serum: Secondary | ICD-10-CM

## 2013-07-16 DIAGNOSIS — M2550 Pain in unspecified joint: Secondary | ICD-10-CM

## 2013-07-16 NOTE — Telephone Encounter (Signed)
Pt called requesting lab results.  Results given to pt as per result note.  Pt states she would like referral to specialist.

## 2013-09-17 ENCOUNTER — Telehealth: Payer: Self-pay | Admitting: *Deleted

## 2013-09-17 NOTE — Telephone Encounter (Signed)
Left detailed message on pts VM advising of providers message.

## 2013-09-17 NOTE — Telephone Encounter (Signed)
Zantac, nexium or prilosec. They are all over the counter

## 2013-09-17 NOTE — Telephone Encounter (Signed)
Pt called requesting medication for heartburn that can e taken with BP medication.  Please advise

## 2013-10-25 ENCOUNTER — Other Ambulatory Visit: Payer: Self-pay

## 2014-01-09 ENCOUNTER — Encounter: Payer: Self-pay | Admitting: Internal Medicine

## 2014-01-09 ENCOUNTER — Ambulatory Visit (INDEPENDENT_AMBULATORY_CARE_PROVIDER_SITE_OTHER): Payer: 59 | Admitting: Internal Medicine

## 2014-01-09 ENCOUNTER — Ambulatory Visit (INDEPENDENT_AMBULATORY_CARE_PROVIDER_SITE_OTHER): Payer: 59

## 2014-01-09 VITALS — BP 137/80 | HR 76 | Temp 98.0°F | Resp 16 | Ht 64.0 in | Wt 199.5 lb

## 2014-01-09 DIAGNOSIS — G4700 Insomnia, unspecified: Secondary | ICD-10-CM

## 2014-01-09 DIAGNOSIS — R768 Other specified abnormal immunological findings in serum: Secondary | ICD-10-CM

## 2014-01-09 DIAGNOSIS — E876 Hypokalemia: Secondary | ICD-10-CM | POA: Insufficient documentation

## 2014-01-09 DIAGNOSIS — IMO0001 Reserved for inherently not codable concepts without codable children: Secondary | ICD-10-CM

## 2014-01-09 DIAGNOSIS — K5900 Constipation, unspecified: Secondary | ICD-10-CM

## 2014-01-09 DIAGNOSIS — T502X5A Adverse effect of carbonic-anhydrase inhibitors, benzothiadiazides and other diuretics, initial encounter: Secondary | ICD-10-CM

## 2014-01-09 DIAGNOSIS — R894 Abnormal immunological findings in specimens from other organs, systems and tissues: Secondary | ICD-10-CM

## 2014-01-09 DIAGNOSIS — K219 Gastro-esophageal reflux disease without esophagitis: Secondary | ICD-10-CM

## 2014-01-09 DIAGNOSIS — I10 Essential (primary) hypertension: Secondary | ICD-10-CM

## 2014-01-09 LAB — COMPREHENSIVE METABOLIC PANEL
ALT: 22 U/L (ref 0–35)
AST: 21 U/L (ref 0–37)
Albumin: 4 g/dL (ref 3.5–5.2)
Alkaline Phosphatase: 46 U/L (ref 39–117)
BUN: 8 mg/dL (ref 6–23)
CO2: 27 mEq/L (ref 19–32)
Calcium: 9.7 mg/dL (ref 8.4–10.5)
Chloride: 103 mEq/L (ref 96–112)
Creatinine, Ser: 0.9 mg/dL (ref 0.4–1.2)
GFR: 96.2 mL/min (ref >60.00)
Glucose, Bld: 98 mg/dL (ref 70–99)
Potassium: 3.3 mEq/L — ABNORMAL LOW (ref 3.5–5.1)
Sodium: 136 mEq/L (ref 135–145)
Total Bilirubin: 0.6 mg/dL (ref 0.3–1.2)
Total Protein: 8 g/dL (ref 6.0–8.3)

## 2014-01-09 LAB — TSH: TSH: 1.25 u[IU]/mL (ref 0.35–5.50)

## 2014-01-09 LAB — CBC WITH DIFFERENTIAL/PLATELET
Basophils Absolute: 0 10*3/uL (ref 0.0–0.1)
Basophils Relative: 0.7 % (ref 0.0–3.0)
Eosinophils Absolute: 0 10*3/uL (ref 0.0–0.7)
Eosinophils Relative: 0.9 % (ref 0.0–5.0)
HCT: 36.6 % (ref 36.0–46.0)
Hemoglobin: 12.3 g/dL (ref 12.0–15.0)
Lymphocytes Relative: 35.9 % (ref 12.0–46.0)
Lymphs Abs: 1.9 10*3/uL (ref 0.7–4.0)
MCHC: 33.7 g/dL (ref 30.0–36.0)
MCV: 77.7 fl — ABNORMAL LOW (ref 78.0–100.0)
Monocytes Absolute: 0.4 10*3/uL (ref 0.1–1.0)
Monocytes Relative: 7.7 % (ref 3.0–12.0)
Neutro Abs: 2.9 10*3/uL (ref 1.4–7.7)
Neutrophils Relative %: 54.8 % (ref 43.0–77.0)
Platelets: 293 10*3/uL (ref 150.0–400.0)
RBC: 4.71 Mil/uL (ref 3.87–5.11)
RDW: 18 % — ABNORMAL HIGH (ref 11.5–14.6)
WBC: 5.3 10*3/uL (ref 4.5–10.5)

## 2014-01-09 LAB — SEDIMENTATION RATE: Sed Rate: 16 mm/hr (ref 0–22)

## 2014-01-09 LAB — CK: Total CK: 117 U/L (ref 7–177)

## 2014-01-09 LAB — C-REACTIVE PROTEIN: CRP: <0.5 mg/dL (ref 0.5–20.0)

## 2014-01-09 MED ORDER — POTASSIUM CHLORIDE CRYS ER 20 MEQ PO TBCR: 20.0000 meq | EXTENDED_RELEASE_TABLET | Freq: Two times a day (BID) | ORAL | Status: DC

## 2014-01-09 MED ORDER — LINACLOTIDE 145 MCG PO CAPS: 145.0000 ug | ORAL_CAPSULE | Freq: Every day | ORAL | Status: DC

## 2014-01-09 MED ORDER — DOXEPIN HCL 10 MG PO CAPS: 10.0000 mg | ORAL_CAPSULE | Freq: Every day | ORAL | Status: DC

## 2014-01-09 MED ORDER — ESOMEPRAZOLE MAGNESIUM 40 MG PO CPDR: 40.0000 mg | DELAYED_RELEASE_CAPSULE | Freq: Every day | ORAL | Status: DC

## 2014-01-09 NOTE — Assessment & Plan Note (Signed)
Will treat this with nexium

## 2014-01-09 NOTE — Progress Notes (Signed)
Subjective:    Patient ID: Samantha Ball, female    DOB: [birth year], 38 y.o.   MRN: [id number]  Gastrophageal Reflux She complains of heartburn. She reports no abdominal pain, no belching, no chest pain, no choking, no coughing, no dysphagia, no early satiety, no globus sensation, no hoarse voice, no nausea, no sore throat, no stridor, no tooth decay, no water brash or no wheezing. This is a new problem. The current episode started more than 1 month ago. The problem occurs frequently. The problem has been unchanged. The heartburn duration is several minutes. The heartburn is located in the substernum. The heartburn is of mild intensity. The heartburn does not wake her from sleep. The heartburn does not limit her activity. The heartburn doesn't change with position. Nothing aggravates the symptoms. Associated symptoms include fatigue. Pertinent negatives include no anemia, melena, muscle weakness, orthopnea or weight loss. Risk factors include obesity. She has tried nothing for the symptoms. The treatment provided no relief.      Review of Systems  Constitutional: Positive for fatigue and unexpected weight change (wt gain). Negative for fever, chills, weight loss, diaphoresis and appetite change.  HENT: Negative.  Negative for hoarse voice and sore throat.   Eyes: Negative.   Respiratory: Negative.  Negative for apnea, cough, choking, chest tightness, shortness of breath, wheezing and stridor.   Cardiovascular: Negative.  Negative for chest pain, palpitations and leg swelling.  Gastrointestinal: Positive for heartburn and constipation. Negative for dysphagia, nausea, vomiting, abdominal pain, diarrhea, blood in stool, melena, abdominal distention, anal bleeding and rectal pain.  Endocrine: Negative.   Genitourinary: Negative.   Musculoskeletal: Positive for arthralgias and myalgias. Negative for back pain, gait problem, joint swelling, muscle weakness, neck pain and neck stiffness.  Skin:  Negative.   Allergic/Immunologic: Negative.   Neurological: Negative.   Hematological: Negative.  Negative for adenopathy. Does not bruise/bleed easily.  Psychiatric/Behavioral: Positive for sleep disturbance (DFA, FA, EMA). Negative for suicidal ideas, hallucinations, behavioral problems, confusion, self-injury, dysphoric mood, decreased concentration and agitation. The patient is not nervous/anxious and is not hyperactive.        Objective:   Physical Exam  Vitals reviewed. Constitutional: She is oriented to person, place, and time. She appears well-developed and well-nourished. No distress.  HENT:  Head: Normocephalic and atraumatic.  Mouth/Throat: Oropharynx is clear and moist. No oropharyngeal exudate.  Eyes: Conjunctivae are normal. Right eye exhibits no discharge. Left eye exhibits no discharge. No scleral icterus.  Neck: Normal range of motion. Neck supple. No JVD present. No tracheal deviation present. No thyromegaly present.  Cardiovascular: Normal rate, regular rhythm, normal heart sounds and intact distal pulses.  Exam reveals no gallop and no friction rub.   No murmur heard. Pulmonary/Chest: Effort normal and breath sounds normal. No stridor. No respiratory distress. She has no wheezes. She has no rales. She exhibits no tenderness.  Abdominal: Soft. Bowel sounds are normal. She exhibits no distension and no mass. There is no tenderness. There is no rebound and no guarding.  Musculoskeletal: Normal range of motion. She exhibits no edema and no tenderness.  Lymphadenopathy:    She has no cervical adenopathy.  Neurological: She is oriented to person, place, and time.  Skin: Skin is warm and dry. No rash noted. She is not diaphoretic. No erythema. No pallor.  Psychiatric: She has a normal mood and affect. Her speech is normal and behavior is normal. Judgment and thought content normal. Cognition and memory are normal. She expresses no homicidal and  no suicidal ideation. She  expresses no suicidal plans and no homicidal plans.     Lab Results  Component Value Date   WBC 5.8 07/11/2013   HGB 12.0 07/11/2013   HCT 36.0 07/11/2013   PLT 302.0 07/11/2013   GLUCOSE 85 07/11/2013   CHOL 180 07/11/2013   TRIG 67.0 07/11/2013   HDL 51.80 07/11/2013   LDLCALC 115* 07/11/2013   ALT 18 07/11/2013   AST 18 07/11/2013   NA 137 07/11/2013   K 4.0 07/11/2013   CL 106 07/11/2013   CREATININE 0.8 07/11/2013   BUN 9 07/11/2013   CO2 24 07/11/2013   TSH 1.01 07/11/2013       Assessment & Plan:

## 2014-01-09 NOTE — Progress Notes (Signed)
Pre visit review using our clinic review tool, if applicable. No additional management support is needed unless otherwise documented below in the visit note. 

## 2014-01-09 NOTE — Assessment & Plan Note (Signed)
She will start doxepin at [facility]

## 2014-01-09 NOTE — Patient Instructions (Signed)

## 2014-01-09 NOTE — Assessment & Plan Note (Signed)
He exam and ROS is benign I will check her labs today to look for secondary/metabolic causes of constipation She will try linzess for symptom relief

## 2014-01-09 NOTE — Assessment & Plan Note (Signed)
Today I will recheck her ANA and will look at other labs to screen for inflammation/connective tissue disease

## 2014-01-09 NOTE — Assessment & Plan Note (Signed)
Her BP is well controlled Today I will check her lytes and renal function 

## 2014-01-09 NOTE — Addendum Note (Signed)
Addended by: [name] on: 01/09/2014 02:29 PM   Modules accepted: Orders

## 2014-01-09 NOTE — Assessment & Plan Note (Signed)
I will recheck her labs to look for myopathy and inflammatory conditions I am concerned that she may have FMG so I gave her pt info about that I will treat her insomnia as well

## 2014-01-10 ENCOUNTER — Encounter: Payer: Self-pay | Admitting: Internal Medicine

## 2014-01-10 LAB — ANTI-NUCLEAR AB-TITER (ANA TITER): ANA Titer 1: 1:40 {titer} — ABNORMAL HIGH

## 2014-01-10 LAB — VITAMIN D 25 HYDROXY (VIT D DEFICIENCY, FRACTURES): Vit D, 25-Hydroxy: 40 ng/mL (ref 30–89)

## 2014-01-10 LAB — ANA: Anti Nuclear Antibody(ANA): POSITIVE — AB

## 2014-01-24 ENCOUNTER — Encounter: Payer: Self-pay | Admitting: Internal Medicine

## 2014-01-28 ENCOUNTER — Ambulatory Visit: Payer: 59 | Admitting: Internal Medicine

## 2014-01-30 ENCOUNTER — Encounter: Payer: Self-pay | Admitting: Internal Medicine

## 2014-01-30 ENCOUNTER — Ambulatory Visit (INDEPENDENT_AMBULATORY_CARE_PROVIDER_SITE_OTHER): Payer: 59 | Admitting: Internal Medicine

## 2014-01-30 VITALS — BP 122/86 | HR 88 | Temp 98.6°F | Resp 16 | Ht 64.0 in | Wt 207.0 lb

## 2014-01-30 DIAGNOSIS — E669 Obesity, unspecified: Secondary | ICD-10-CM

## 2014-01-30 MED ORDER — LORCASERIN HCL 10 MG PO TABS: 1.0000 | ORAL_TABLET | Freq: Two times a day (BID) | ORAL | Status: DC

## 2014-01-30 NOTE — Patient Instructions (Signed)
Obesity Obesity is defined as having too much total body fat and a body mass index (BMI) of 30 or more. BMI is an estimate of body fat and is calculated from your height and weight. Obesity happens when you consume more calories than you can burn by exercising or performing daily physical tasks. Prolonged obesity can cause major illnesses or emergencies, such as:   A stroke.  Heart disease.  Diabetes.  Cancer.  Arthritis.  High blood pressure (hypertension).  High cholesterol.  Sleep apnea.  Erectile dysfunction.  Infertility problems. CAUSES   Regularly eating unhealthy foods.  Physical inactivity.  Certain disorders, such as an underactive thyroid (hypothyroidism), Cushing's syndrome, and polycystic ovarian syndrome.  Certain medicines, such as steroids, some depression medicines, and antipsychotics.  Genetics.  Lack of sleep. DIAGNOSIS  A caregiver can diagnose obesity after calculating your BMI. Obesity will be diagnosed if your BMI is 30 or higher.  There are other methods of measuring obesity levels. Some other methods include measuring your skin fold thickness, your waist circumference, and comparing your hip circumference to your waist circumference. TREATMENT  A healthy treatment program includes some or all of the following:  Long-term dietary changes.  Exercise and physical activity.  Behavioral and lifestyle changes.  Medicine only under the supervision of your caregiver. Medicines may help, but only if they are used with diet and exercise programs. An unhealthy treatment program includes:  Fasting.  Fad diets.  Supplements and drugs. These choices do not succeed in long-term weight control.  HOME CARE INSTRUCTIONS   Exercise and perform physical activity as directed by your caregiver. To increase physical activity, try the following:  Use stairs instead of elevators.  Park farther away from store entrances.  Garden, bike, or walk instead of  watching television or using the computer.  Eat healthy, low-calorie foods and drinks on a regular basis. Eat more fruits and vegetables. Use low-calorie cookbooks or take healthy cooking classes.  Limit fast food, sweets, and processed snack foods.  Eat smaller portions.  Keep a daily journal of everything you eat. There are many free websites to help you with this. It may be helpful to measure your foods so you can determine if you are eating the correct portion sizes.  Avoid drinking alcohol. Drink more water and drinks without calories.  Take vitamins and supplements only as recommended by your caregiver.  Weight-loss support groups, Registered Dieticians, counselors, and stress reduction education can also be very helpful. SEEK IMMEDIATE MEDICAL CARE IF:  You have chest pain or tightness.  You have trouble breathing or feel short of breath.  You have weakness or leg numbness.  You feel confused or have trouble talking.  You have sudden changes in your vision. MAKE SURE YOU:  Understand these instructions.  Will watch your condition.  Will get help right away if you are not doing well or get worse. Document Released: 01/13/2005 Document Revised: 06/06/2012 Document Reviewed: 01/12/2012 [employer] Patient Information 2014 [employer].  

## 2014-01-30 NOTE — Progress Notes (Signed)
   Subjective:    Patient ID: Samantha Ball, female    DOB: March 16, 1976, 38 y.o.   MRN: [id number]  HPI Comments: She wants to try belviq to help her lose weight     Review of Systems  All other systems reviewed and are negative.       Objective:   Physical Exam  Vitals reviewed. Constitutional: She is oriented to person, place, and time. She appears well-developed and well-nourished. No distress.  HENT:  Head: Normocephalic and atraumatic.  Mouth/Throat: Oropharynx is clear and moist. No oropharyngeal exudate.  Eyes: Conjunctivae are normal. Right eye exhibits no discharge. Left eye exhibits no discharge. No scleral icterus.  Neck: Normal range of motion. Neck supple. No JVD present. No tracheal deviation present. No thyromegaly present.  Cardiovascular: Normal rate, regular rhythm, normal heart sounds and intact distal pulses.  Exam reveals no gallop and no friction rub.   No murmur heard. Pulmonary/Chest: Effort normal and breath sounds normal. No stridor. No respiratory distress. She has no wheezes. She has no rales. She exhibits no tenderness.  Abdominal: Soft. Bowel sounds are normal. She exhibits no distension and no mass. There is no tenderness. There is no rebound and no guarding.  Musculoskeletal: Normal range of motion. She exhibits no edema and no tenderness.  Lymphadenopathy:    She has no cervical adenopathy.  Neurological: She is oriented to person, place, and time.  Skin: Skin is warm and dry. No rash noted. She is not diaphoretic. No erythema. No pallor.     Lab Results  Component Value Date   WBC 5.3 01/09/2014   HGB 12.3 01/09/2014   HCT 36.6 01/09/2014   PLT 293.0 Repeated and verified X2. 01/09/2014   GLUCOSE 98 01/09/2014   CHOL 180 07/11/2013   TRIG 67.0 07/11/2013   HDL 51.80 07/11/2013   LDLCALC 115* 07/11/2013   ALT 22 01/09/2014   AST 21 01/09/2014   NA 136 01/09/2014   K 3.3* 01/09/2014   CL 103 01/09/2014   CREATININE 0.9 01/09/2014   BUN 8 01/09/2014    CO2 27 01/09/2014   TSH 1.25 01/09/2014       Assessment & Plan:

## 2014-01-30 NOTE — Assessment & Plan Note (Signed)
She will cont her diet and exercise regimen and will start belviq

## 2014-01-30 NOTE — Progress Notes (Signed)
Pre visit review using our clinic review tool, if applicable. No additional management support is needed unless otherwise documented below in the visit note. 

## 2014-03-09 ENCOUNTER — Other Ambulatory Visit: Payer: Self-pay | Admitting: Internal Medicine

## 2014-03-18 ENCOUNTER — Encounter: Payer: Self-pay | Admitting: Internal Medicine

## 2014-03-19 MED ORDER — LORCASERIN HCL 10 MG PO TABS: ORAL_TABLET | ORAL | Status: DC

## 2014-03-19 NOTE — Telephone Encounter (Signed)
Corrected rx pending signature

## 2014-04-29 ENCOUNTER — Other Ambulatory Visit (INDEPENDENT_AMBULATORY_CARE_PROVIDER_SITE_OTHER): Payer: 59

## 2014-04-29 ENCOUNTER — Ambulatory Visit (INDEPENDENT_AMBULATORY_CARE_PROVIDER_SITE_OTHER): Payer: 59 | Admitting: Internal Medicine

## 2014-04-29 ENCOUNTER — Encounter: Payer: Self-pay | Admitting: Internal Medicine

## 2014-04-29 VITALS — BP 138/80 | HR 64 | Temp 97.8°F | Resp 16 | Ht 64.0 in | Wt 200.5 lb

## 2014-04-29 DIAGNOSIS — I10 Essential (primary) hypertension: Secondary | ICD-10-CM

## 2014-04-29 DIAGNOSIS — E876 Hypokalemia: Secondary | ICD-10-CM

## 2014-04-29 LAB — BASIC METABOLIC PANEL
BUN: 10 mg/dL (ref 6–23)
CO2: 27 mEq/L (ref 19–32)
Calcium: 10.1 mg/dL (ref 8.4–10.5)
Chloride: 101 mEq/L (ref 96–112)
Creatinine, Ser: 0.9 mg/dL (ref 0.4–1.2)
GFR: 91.08 mL/min (ref >60.00)
Glucose, Bld: 90 mg/dL (ref 70–99)
Potassium: 3.8 mEq/L (ref 3.5–5.1)
Sodium: 135 mEq/L (ref 135–145)

## 2014-04-29 LAB — MAGNESIUM: Magnesium: 1.8 mg/dL (ref 1.5–2.5)

## 2014-04-29 NOTE — Progress Notes (Signed)
Pre visit review using our clinic review tool, if applicable. No additional management support is needed unless otherwise documented below in the visit note. 

## 2014-04-29 NOTE — Progress Notes (Signed)
   Subjective:    Patient ID: Samantha Ball, female    DOB: March 19, 1976, 38 y.o.   MRN: [id number]  Hypertension This is a chronic problem. The current episode started more than 1 year ago. The problem has been gradually improving since onset. The problem is controlled. Pertinent negatives include no anxiety, blurred vision, chest pain, headaches, malaise/fatigue, neck pain, orthopnea, palpitations, peripheral edema, PND, shortness of breath or sweats. Agents associated with hypertension include anorectics. Past treatments include diuretics. The current treatment provides moderate improvement. Compliance problems include diet and exercise.       Review of Systems  Constitutional: Negative.  Negative for fever, chills, malaise/fatigue, diaphoresis, appetite change and fatigue.  HENT: Negative.   Eyes: Negative.  Negative for blurred vision.  Respiratory: Negative.  Negative for cough, choking, chest tightness, shortness of breath, wheezing and stridor.   Cardiovascular: Negative.  Negative for chest pain, palpitations, orthopnea, leg swelling and PND.  Gastrointestinal: Negative.  Negative for nausea, abdominal pain, diarrhea, constipation and blood in stool.  Endocrine: Negative.   Genitourinary: Negative.   Musculoskeletal: Negative.  Negative for arthralgias, back pain, myalgias, neck pain and neck stiffness.  Skin: Negative.   Allergic/Immunologic: Negative.   Neurological: Negative.  Negative for headaches.  Hematological: Negative.  Negative for adenopathy. Does not bruise/bleed easily.  Psychiatric/Behavioral: Negative.        Objective:   Physical Exam  Vitals reviewed. Constitutional: She is oriented to person, place, and time. She appears well-developed and well-nourished. No distress.  HENT:  Head: Normocephalic and atraumatic.  Mouth/Throat: Oropharynx is clear and moist. No oropharyngeal exudate.  Eyes: Conjunctivae are normal. Right eye exhibits no discharge. Left eye  exhibits no discharge. No scleral icterus.  Neck: Normal range of motion. Neck supple. No JVD present. No tracheal deviation present. No thyromegaly present.  Cardiovascular: Normal rate, regular rhythm, normal heart sounds and intact distal pulses.  Exam reveals no gallop and no friction rub.   No murmur heard. Pulmonary/Chest: Effort normal and breath sounds normal. No stridor. No respiratory distress. She has no wheezes. She has no rales. She exhibits no tenderness.  Abdominal: Soft. Bowel sounds are normal. She exhibits no distension and no mass. There is no tenderness. There is no rebound and no guarding.  Musculoskeletal: Normal range of motion. She exhibits no edema and no tenderness.  Lymphadenopathy:    She has no cervical adenopathy.  Neurological: She is oriented to person, place, and time.  Skin: Skin is warm and dry. No rash noted. She is not diaphoretic. No erythema. No pallor.  Psychiatric: She has a normal mood and affect. Her behavior is normal. Judgment and thought content normal.     Lab Results  Component Value Date   WBC 5.3 01/09/2014   HGB 12.3 01/09/2014   HCT 36.6 01/09/2014   PLT 293.0 Repeated and verified X2. 01/09/2014   GLUCOSE 98 01/09/2014   CHOL 180 07/11/2013   TRIG 67.0 07/11/2013   HDL 51.80 07/11/2013   LDLCALC 115* 07/11/2013   ALT 22 01/09/2014   AST 21 01/09/2014   NA 136 01/09/2014   K 3.3* 01/09/2014   CL 103 01/09/2014   CREATININE 0.9 01/09/2014   BUN 8 01/09/2014   CO2 27 01/09/2014   TSH 1.25 01/09/2014       Assessment & Plan:

## 2014-04-29 NOTE — Patient Instructions (Signed)

## 2014-04-30 ENCOUNTER — Encounter: Payer: Self-pay | Admitting: Internal Medicine

## 2014-04-30 ENCOUNTER — Telehealth: Payer: Self-pay | Admitting: Internal Medicine

## 2014-04-30 NOTE — Assessment & Plan Note (Signed)
Her BP is well controlled and her lytes and renal function are stable 

## 2014-04-30 NOTE — Telephone Encounter (Signed)
Relevant patient education assigned to patient using Emmi. ° °

## 2014-04-30 NOTE — Assessment & Plan Note (Signed)
Improvement noted 

## 2014-05-15 ENCOUNTER — Other Ambulatory Visit: Payer: Self-pay | Admitting: Internal Medicine

## 2014-05-15 DIAGNOSIS — K219 Gastro-esophageal reflux disease without esophagitis: Secondary | ICD-10-CM

## 2014-05-15 MED ORDER — ESOMEPRAZOLE MAGNESIUM 40 MG PO CPDR: 40.0000 mg | DELAYED_RELEASE_CAPSULE | Freq: Every day | ORAL | Status: DC

## 2014-07-17 ENCOUNTER — Other Ambulatory Visit: Payer: Self-pay | Admitting: Internal Medicine

## 2014-07-30 ENCOUNTER — Other Ambulatory Visit: Payer: Self-pay | Admitting: Internal Medicine

## 2014-10-10 ENCOUNTER — Other Ambulatory Visit: Payer: Self-pay | Admitting: Internal Medicine

## 2014-10-21 ENCOUNTER — Encounter: Payer: Self-pay | Admitting: Internal Medicine

## 2014-12-02 ENCOUNTER — Other Ambulatory Visit: Payer: Self-pay | Admitting: Internal Medicine

## 2014-12-16 ENCOUNTER — Ambulatory Visit (INDEPENDENT_AMBULATORY_CARE_PROVIDER_SITE_OTHER): Payer: 59 | Admitting: Internal Medicine

## 2014-12-16 ENCOUNTER — Other Ambulatory Visit (INDEPENDENT_AMBULATORY_CARE_PROVIDER_SITE_OTHER): Payer: 59

## 2014-12-16 ENCOUNTER — Encounter: Payer: Self-pay | Admitting: Internal Medicine

## 2014-12-16 VITALS — BP 132/94 | HR 54 | Temp 98.0°F | Resp 12 | Ht 64.0 in | Wt 201.0 lb

## 2014-12-16 DIAGNOSIS — E669 Obesity, unspecified: Secondary | ICD-10-CM

## 2014-12-16 DIAGNOSIS — E876 Hypokalemia: Secondary | ICD-10-CM

## 2014-12-16 DIAGNOSIS — I10 Essential (primary) hypertension: Secondary | ICD-10-CM

## 2014-12-16 LAB — CBC WITH DIFFERENTIAL/PLATELET
Basophils Absolute: 0 10*3/uL (ref 0.0–0.1)
Basophils Relative: 0.4 % (ref 0.0–3.0)
Eosinophils Absolute: 0.1 10*3/uL (ref 0.0–0.7)
Eosinophils Relative: 1.1 % (ref 0.0–5.0)
HCT: 37 % (ref 36.0–46.0)
Hemoglobin: 11.6 g/dL — ABNORMAL LOW (ref 12.0–15.0)
Lymphocytes Relative: 39.9 % (ref 12.0–46.0)
Lymphs Abs: 3.3 10*3/uL (ref 0.7–4.0)
MCHC: 31.3 g/dL (ref 30.0–36.0)
MCV: 74.1 fl — ABNORMAL LOW (ref 78.0–100.0)
Monocytes Absolute: 0.7 10*3/uL (ref 0.1–1.0)
Monocytes Relative: 8.3 % (ref 3.0–12.0)
Neutro Abs: 4.1 10*3/uL (ref 1.4–7.7)
Neutrophils Relative %: 50.3 % (ref 43.0–77.0)
Platelets: 287 10*3/uL (ref 150.0–400.0)
RBC: 4.99 Mil/uL (ref 3.87–5.11)
RDW: 18 % — ABNORMAL HIGH (ref 11.5–15.5)
WBC: 8.2 10*3/uL (ref 4.0–10.5)

## 2014-12-16 LAB — BASIC METABOLIC PANEL
BUN: 8 mg/dL (ref 6–23)
CO2: 25 mEq/L (ref 19–32)
Calcium: 9.7 mg/dL (ref 8.4–10.5)
Chloride: 105 mEq/L (ref 96–112)
Creatinine, Ser: 0.8 mg/dL (ref 0.4–1.2)
GFR: 97.04 mL/min (ref >60.00)
Glucose, Bld: 92 mg/dL (ref 70–99)
Potassium: 4 mEq/L (ref 3.5–5.1)
Sodium: 136 mEq/L (ref 135–145)

## 2014-12-16 LAB — MAGNESIUM: Magnesium: 1.9 mg/dL (ref 1.5–2.5)

## 2014-12-16 LAB — TSH: TSH: 1.46 u[IU]/mL (ref 0.35–4.50)

## 2014-12-16 MED ORDER — PHENTERMINE HCL 30 MG PO CAPS: 30.0000 mg | ORAL_CAPSULE | ORAL | Status: DC

## 2014-12-16 MED ORDER — HYDROCHLOROTHIAZIDE 12.5 MG PO CAPS: 12.5000 mg | ORAL_CAPSULE | Freq: Every day | ORAL | Status: DC

## 2014-12-16 NOTE — Assessment & Plan Note (Signed)
This is normal now Will cont the K+ replacement therapy

## 2014-12-16 NOTE — Patient Instructions (Signed)

## 2014-12-16 NOTE — Assessment & Plan Note (Signed)
She has not lost weight and she tells me that belviq is too expensive Will try phentermine

## 2014-12-16 NOTE — Progress Notes (Signed)
   Subjective:    Patient ID: Samantha Ball, female    DOB: 12/20/1976, 38 y.o.   MRN: [id number]  Hypertension This is a chronic problem. The current episode started more than 1 year ago. The problem has been gradually worsening since onset. The problem is uncontrolled. Pertinent negatives include no anxiety, blurred vision, chest pain, headaches, malaise/fatigue, neck pain, orthopnea, palpitations, peripheral edema, PND, shortness of breath or sweats. Risk factors for coronary artery disease include obesity. Past treatments include diuretics. The current treatment provides mild improvement. Compliance problems include diet, exercise and psychosocial issues.       Review of Systems  Constitutional: Negative.  Negative for fever, chills, malaise/fatigue, diaphoresis, appetite change and fatigue.  HENT: Negative.   Eyes: Negative.  Negative for blurred vision.  Respiratory: Negative.  Negative for cough, choking, chest tightness, shortness of breath and stridor.   Cardiovascular: Negative.  Negative for chest pain, palpitations, orthopnea, leg swelling and PND.  Gastrointestinal: Negative.  Negative for nausea, vomiting, abdominal pain, diarrhea, constipation and blood in stool.  Endocrine: Negative.   Genitourinary: Negative.  Negative for dysuria, hematuria, flank pain and difficulty urinating.  Musculoskeletal: Negative.  Negative for neck pain.  Skin: Negative.   Allergic/Immunologic: Negative.   Neurological: Negative.  Negative for dizziness, tremors, weakness, light-headedness and headaches.  Hematological: Negative.  Negative for adenopathy. Does not bruise/bleed easily.  Psychiatric/Behavioral: Negative.        Objective:   Physical Exam  Constitutional: She is oriented to person, place, and time. She appears well-developed and well-nourished. No distress.  HENT:  Head: Normocephalic and atraumatic.  Mouth/Throat: Oropharynx is clear and moist. No oropharyngeal exudate.    Eyes: Conjunctivae are normal. Right eye exhibits no discharge. Left eye exhibits no discharge. No scleral icterus.  Neck: Normal range of motion. Neck supple. No JVD present. No tracheal deviation present. No thyromegaly present.  Cardiovascular: Normal rate, regular rhythm, normal heart sounds and intact distal pulses.  Exam reveals no gallop and no friction rub.   No murmur heard. Pulmonary/Chest: Effort normal and breath sounds normal. No stridor. No respiratory distress. She has no wheezes. She has no rales. She exhibits no tenderness.  Abdominal: Soft. Bowel sounds are normal. She exhibits no distension and no mass. There is no tenderness. There is no rebound and no guarding.  Musculoskeletal: Normal range of motion. She exhibits no edema or tenderness.  Lymphadenopathy:    She has no cervical adenopathy.  Neurological: She is oriented to person, place, and time.  Skin: Skin is warm and dry. No rash noted. She is not diaphoretic. No erythema. No pallor.  Psychiatric: She has a normal mood and affect. Her behavior is normal. Judgment and thought content normal.  Vitals reviewed.   Lab Results  Component Value Date   WBC 8.2 12/16/2014   HGB 11.6* 12/16/2014   HCT 37.0 12/16/2014   PLT 287.0 12/16/2014   GLUCOSE 92 12/16/2014   CHOL 180 07/11/2013   TRIG 67.0 07/11/2013   HDL 51.80 07/11/2013   LDLCALC 115* 07/11/2013   ALT 22 01/09/2014   AST 21 01/09/2014   NA 136 12/16/2014   K 4.0 12/16/2014   CL 105 12/16/2014   CREATININE 0.8 12/16/2014   BUN 8 12/16/2014   CO2 25 12/16/2014   TSH 1.46 12/16/2014        Assessment & Plan:

## 2014-12-16 NOTE — Assessment & Plan Note (Signed)
She has been out of the HCTZ for several weeks and her BP is not well controlled Will restart the HCTZ, will check her labs today for secondary causes of HTN

## 2014-12-17 ENCOUNTER — Other Ambulatory Visit (INDEPENDENT_AMBULATORY_CARE_PROVIDER_SITE_OTHER): Payer: 59

## 2014-12-17 ENCOUNTER — Other Ambulatory Visit: Payer: Self-pay | Admitting: Internal Medicine

## 2014-12-17 DIAGNOSIS — D508 Other iron deficiency anemias: Secondary | ICD-10-CM | POA: Diagnosis not present

## 2014-12-17 LAB — CBC WITH DIFFERENTIAL/PLATELET
Basophils Absolute: 0.1 10*3/uL (ref 0.0–0.1)
Basophils Relative: 0.5 % (ref 0.0–3.0)
Eosinophils Absolute: 0.1 10*3/uL (ref 0.0–0.7)
Eosinophils Relative: 0.8 % (ref 0.0–5.0)
HCT: 35.3 % — ABNORMAL LOW (ref 36.0–46.0)
Hemoglobin: 11.3 g/dL — ABNORMAL LOW (ref 12.0–15.0)
Lymphocytes Relative: 29 % (ref 12.0–46.0)
Lymphs Abs: 2.8 10*3/uL (ref 0.7–4.0)
MCHC: 32.1 g/dL (ref 30.0–36.0)
MCV: 72.7 fl — ABNORMAL LOW (ref 78.0–100.0)
Monocytes Absolute: 0.6 10*3/uL (ref 0.1–1.0)
Monocytes Relative: 6.4 % (ref 3.0–12.0)
Neutro Abs: 6.2 10*3/uL (ref 1.4–7.7)
Neutrophils Relative %: 63.3 % (ref 43.0–77.0)
Platelets: 299 10*3/uL (ref 150.0–400.0)
RBC: 4.86 Mil/uL (ref 3.87–5.11)
RDW: 17.9 % — ABNORMAL HIGH (ref 11.5–15.5)
WBC: 9.8 10*3/uL (ref 4.0–10.5)

## 2014-12-18 LAB — IBC PANEL
Iron: 33 ug/dL — ABNORMAL LOW (ref 42–145)
Saturation Ratios: 6.7 % — ABNORMAL LOW (ref 20.0–50.0)
Transferrin: 350 mg/dL (ref 212.0–360.0)

## 2014-12-18 LAB — VITAMIN B12: Vitamin B-12: 486 pg/mL (ref 211–911)

## 2014-12-18 LAB — FOLATE: Folate: 19.2 ng/mL (ref >5.9)

## 2014-12-18 LAB — FERRITIN: Ferritin: 3.8 ng/mL — ABNORMAL LOW (ref 10.0–291.0)

## 2014-12-20 ENCOUNTER — Encounter: Payer: Self-pay | Admitting: Internal Medicine

## 2014-12-20 ENCOUNTER — Other Ambulatory Visit: Payer: Self-pay | Admitting: Internal Medicine

## 2014-12-20 MED ORDER — FERROUS SULFATE 325 (65 FE) MG PO TABS: 325.0000 mg | ORAL_TABLET | Freq: Two times a day (BID) | ORAL | Status: DC

## 2014-12-24 ENCOUNTER — Telehealth: Payer: Self-pay | Admitting: Internal Medicine

## 2014-12-24 MED ORDER — PROMETHAZINE-DM 6.25-15 MG/5ML PO SYRP
5.0000 mL | ORAL_SOLUTION | Freq: Four times a day (QID) | ORAL | Status: DC | PRN
Start: 2014-12-24 — End: 2014-12-27

## 2014-12-24 NOTE — Telephone Encounter (Signed)
Try phenergan-dm Rx was sent to pharmacy  T. Ronnald Ramp

## 2014-12-24 NOTE — Telephone Encounter (Signed)
Left patient a VM that RX sent to pharmacy

## 2014-12-24 NOTE — Telephone Encounter (Signed)
Patient has a cold and also HBP. Wants to know what options she has for OTC meds to address cold symptoms

## 2014-12-27 ENCOUNTER — Ambulatory Visit (INDEPENDENT_AMBULATORY_CARE_PROVIDER_SITE_OTHER): Payer: 59 | Admitting: Internal Medicine

## 2014-12-27 ENCOUNTER — Encounter: Payer: Self-pay | Admitting: Internal Medicine

## 2014-12-27 VITALS — BP 140/88 | HR 84 | Temp 98.1°F | Resp 16 | Ht 64.0 in | Wt 199.0 lb

## 2014-12-27 DIAGNOSIS — J069 Acute upper respiratory infection, unspecified: Secondary | ICD-10-CM

## 2014-12-27 DIAGNOSIS — B9789 Other viral agents as the cause of diseases classified elsewhere: Principal | ICD-10-CM

## 2014-12-27 MED ORDER — PSEUDOEPH-HYDROCODONE-GG 30-2.5-200 MG/5ML PO SOLN: 5.0000 mL | Freq: Four times a day (QID) | ORAL | Status: DC | PRN

## 2014-12-27 NOTE — Patient Instructions (Signed)
Cough, Adult  A cough is a reflex that helps clear your throat and airways. It can help heal the body or may be a reaction to an irritated airway. A cough may only last 2 or 3 weeks (acute) or may last more than 8 weeks (chronic).  CAUSES Acute cough:  Viral or bacterial infections. Chronic cough:  Infections.  Allergies.  Asthma.  Post-nasal drip.  Smoking.  Heartburn or acid reflux.  Some medicines.  Chronic lung problems (COPD).  Cancer. SYMPTOMS   Cough.  Fever.  Chest pain.  Increased breathing rate.  High-pitched whistling sound when breathing (wheezing).  Colored mucus that you cough up (sputum). TREATMENT   A bacterial cough may be treated with antibiotic medicine.  A viral cough must run its course and will not respond to antibiotics.  Your caregiver may recommend other treatments if you have a chronic cough. HOME CARE INSTRUCTIONS   Only take over-the-counter or prescription medicines for pain, discomfort, or fever as directed by your caregiver. Use cough suppressants only as directed by your caregiver.  Use a cold steam vaporizer or humidifier in your bedroom or home to help loosen secretions.  Sleep in a semi-upright position if your cough is worse at night.  Rest as needed.  Stop smoking if you smoke. SEEK IMMEDIATE MEDICAL CARE IF:   You have pus in your sputum.  Your cough starts to worsen.  You cannot control your cough with suppressants and are losing sleep.  You begin coughing up blood.  You have difficulty breathing.  You develop pain which is getting worse or is uncontrolled with medicine.  You have a fever. MAKE SURE YOU:   Understand these instructions.  Will watch your condition.  Will get help right away if you are not doing well or get worse. Document Released: 06/04/2011 Document Revised: 02/28/2012 Document Reviewed: 06/04/2011 [employer] Patient Information 2015 [employer]. This information is not intended  to replace advice given to you by your health care provider. Make sure you discuss any questions you have with your health care provider.  

## 2014-12-27 NOTE — Progress Notes (Signed)
Subjective:    Patient ID: Samantha Ball, female    DOB: [birth year], 39 y.o.   MRN: [id number]  Cough This is a new problem. The current episode started in the past 7 days. The problem has been gradually worsening. The problem occurs every few hours. The cough is non-productive. Associated symptoms include chills, nasal congestion and a sore throat. Pertinent negatives include no chest pain, ear congestion, ear pain, fever, headaches, heartburn, hemoptysis, myalgias, postnasal drip, rash, rhinorrhea, shortness of breath, sweats, weight loss or wheezing. She has tried OTC cough suppressant for the symptoms. The treatment provided no relief. There is no history of asthma, bronchiectasis, bronchitis, COPD, emphysema, environmental allergies or pneumonia.      Review of Systems  Constitutional: Positive for chills. Negative for fever, weight loss, diaphoresis, activity change, appetite change and fatigue.  HENT: Positive for congestion, sneezing and sore throat. Negative for dental problem, ear pain, postnasal drip, rhinorrhea, sinus pressure, tinnitus and voice change.   Eyes: Negative.   Respiratory: Positive for cough. Negative for apnea, hemoptysis, choking, chest tightness, shortness of breath, wheezing and stridor.   Cardiovascular: Negative.  Negative for chest pain, palpitations and leg swelling.  Gastrointestinal: Negative.  Negative for heartburn, nausea, vomiting, abdominal pain, diarrhea and constipation.  Endocrine: Negative.   Genitourinary: Negative.   Musculoskeletal: Negative.  Negative for myalgias.  Skin: Negative.  Negative for rash.  Allergic/Immunologic: Negative.  Negative for environmental allergies.  Neurological: Negative.  Negative for headaches.  Hematological: Negative.  Negative for adenopathy. Does not bruise/bleed easily.  Psychiatric/Behavioral: Negative.        Objective:   Physical Exam  Constitutional: She is oriented to person, place, and time. She  appears well-developed and well-nourished.  Non-toxic appearance. She does not have a sickly appearance. She does not appear ill. No distress.  HENT:  Head: Normocephalic and atraumatic.  Mouth/Throat: Oropharynx is clear and moist. No oropharyngeal exudate.  Eyes: Conjunctivae are normal. Right eye exhibits no discharge. Left eye exhibits no discharge. No scleral icterus.  Neck: Normal range of motion. Neck supple. No JVD present. No tracheal deviation present. No thyromegaly present.  Cardiovascular: Normal rate, regular rhythm, normal heart sounds and intact distal pulses.  Exam reveals no gallop and no friction rub.   No murmur heard. Pulmonary/Chest: Effort normal and breath sounds normal. No stridor. No respiratory distress. She has no wheezes. She has no rales. She exhibits no tenderness.  Abdominal: Soft. Bowel sounds are normal. She exhibits no distension and no mass. There is no tenderness. There is no rebound and no guarding.  Musculoskeletal: Normal range of motion. She exhibits no edema or tenderness.  Lymphadenopathy:    She has no cervical adenopathy.  Neurological: She is oriented to person, place, and time.  Skin: Skin is warm and dry. No rash noted. She is not diaphoretic. No erythema. No pallor.  Vitals reviewed.    Lab Results  Component Value Date   WBC 9.8 12/17/2014   HGB 11.3* 12/17/2014   HCT 35.3* 12/17/2014   PLT 299.0 12/17/2014   GLUCOSE 92 12/16/2014   CHOL 180 07/11/2013   TRIG 67.0 07/11/2013   HDL 51.80 07/11/2013   LDLCALC 115* 07/11/2013   ALT 22 01/09/2014   AST 21 01/09/2014   NA 136 12/16/2014   K 4.0 12/16/2014   CL 105 12/16/2014   CREATININE 0.8 12/16/2014   BUN 8 12/16/2014   CO2 25 12/16/2014   TSH 1.46 12/16/2014  Assessment & Plan:

## 2014-12-27 NOTE — Assessment & Plan Note (Signed)
S/s are c/w a viral cause Phenergan-dm did not help much Will try hycofenix for better symptom relief

## 2015-02-03 ENCOUNTER — Encounter: Payer: Self-pay | Admitting: Internal Medicine

## 2015-02-05 ENCOUNTER — Ambulatory Visit: Payer: 59

## 2015-02-05 ENCOUNTER — Telehealth: Payer: Self-pay

## 2015-02-05 VITALS — BP 142/110

## 2015-02-05 DIAGNOSIS — I10 Essential (primary) hypertension: Secondary | ICD-10-CM

## 2015-02-05 DIAGNOSIS — Z0130 Encounter for examination of blood pressure without abnormal findings: Secondary | ICD-10-CM

## 2015-02-05 MED ORDER — NEBIVOLOL HCL 10 MG PO TABS: 10.0000 mg | ORAL_TABLET | Freq: Every day | ORAL | Status: DC

## 2015-02-05 NOTE — Telephone Encounter (Signed)
Pt has 2 high bp readings today at nurse visit.   142/110 and 142/108  Pt stated that she has not taken the HCTZ since Saturday.

## 2015-02-05 NOTE — Telephone Encounter (Signed)
LVM for pt to call back.

## 2015-02-05 NOTE — Telephone Encounter (Signed)
Start bystolic Return to see me in 1-2 weeks for a BP check

## 2015-02-18 ENCOUNTER — Ambulatory Visit (INDEPENDENT_AMBULATORY_CARE_PROVIDER_SITE_OTHER): Payer: 59 | Admitting: Internal Medicine

## 2015-02-18 ENCOUNTER — Encounter: Payer: Self-pay | Admitting: Internal Medicine

## 2015-02-18 VITALS — BP 128/100 | HR 68 | Temp 98.7°F | Resp 16 | Ht 64.0 in | Wt 200.0 lb

## 2015-02-18 DIAGNOSIS — R220 Localized swelling, mass and lump, head: Secondary | ICD-10-CM

## 2015-02-18 DIAGNOSIS — L500 Allergic urticaria: Secondary | ICD-10-CM

## 2015-02-18 MED ORDER — HYDROXYZINE HCL 10 MG PO TABS: 10.0000 mg | ORAL_TABLET | Freq: Three times a day (TID) | ORAL | Status: DC | PRN

## 2015-02-18 NOTE — Patient Instructions (Addendum)
Avoid soaps and cosmetics which are not hypoallergenic. Restrict hyperallergenic foods at this time: Nuts, strawberries, seafood , chocolate, and tomatoes.    If you have any facial swelling; take the hydroxyzine & document any dietary , medicinal, chemical or environmental exposures in the previous 8-12 hours. Go to the emergency room if you have any swelling of the tongue.   Minimal Blood Pressure Goal= AVERAGE < 140/90;  Ideal is an AVERAGE < 135/85. This AVERAGE should be calculated from @ least 5-7 BP readings taken @ different times of day on different days of week. You should not respond to isolated BP readings , but rather the AVERAGE for that week .Please bring your  blood pressure cuff to office visits to verify that it is reliable.It  can also be checked against the blood pressure device at the pharmacy. Finger or wrist cuffs are not dependable; an arm cuff is.

## 2015-02-18 NOTE — Progress Notes (Signed)
   Subjective:    Patient ID: Samantha Ball, female    DOB: 03/24/1976, 39 y.o.   MRN: [id number]  HPI  She awoke this morning with swelling and itching of the lips. She took 2 allergy tablets with resolution of symptoms.  She had not taken any of her medications this morning. She has not been taking HCTZ since she started Bystolic. She did not realize she was be on both of them. She has not taken Lizess for at least 4-6 weeks. She is not on an ACE inhibitor or angiotensin receptor blocker.  She had [employer] chicken @ dinner last night; she denies any food triggers such as nuts, strawberries, chocolate, shellfish, or tomatoes.  Review of Systems   No associated itchy, watery eyes.  Swelling of the tongue or intraoral lesions denied.  Shortness of breath, wheezing, or cough absent.  No vesicles, pustules or urticaria noted.  Fever ,chills , or sweats denied.   Diarrhea not present.  No dysuria, pyuria or hematuria.    Objective:   Physical Exam General appearance:Adequately nourished; no acute distress or increased work of breathing is present.  No  lymphadenopathy about the head, neck, or axilla noted.   Eyes: No conjunctival inflammation or lid edema is present. There is no scleral icterus.  Ears:  External ear exam shows no significant lesions or deformities.  Hearing aid on R; L TM WNL.  Nose:  External nasal examination shows no deformity or inflammation. Nasal mucosa are pink and moist without lesions or exudates. No septal dislocation or deviation.No obstruction to airflow.   Oral exam: Dental hygiene is good; lips and gums are healthy appearing.There is no oropharyngeal erythema or exudate noted.   Neck:  No deformities, thyromegaly, masses, or tenderness noted.   Supple with full range of motion without pain.   Heart:  Normal rate and regular rhythm. S1 and S2 normal without gallop, murmur, click, rub or other extra sounds.   Lungs:Chest clear to auscultation; no  wheezes, rhonchi,rales ,or rubs present.  Extremities:  No cyanosis, edema, or clubbing  noted    Skin: Warm & dry w/o tenting.      Assessment & Plan:  #1 urticaria of the lips without associated tongue or oropharyngeal edema. No definite trigger.  Plan: See orders ,recommendations and after visit summary

## 2015-02-18 NOTE — Progress Notes (Signed)
Pre visit review using our clinic review tool, if applicable. No additional management support is needed unless otherwise documented below in the visit note. 

## 2015-03-14 ENCOUNTER — Emergency Department (INDEPENDENT_AMBULATORY_CARE_PROVIDER_SITE_OTHER)
Admission: EM | Admit: 2015-03-14 | Discharge: 2015-03-14 | Disposition: A | Discharge status: Home / Self Care | Payer: 59 | Source: Home / Self Care

## 2015-03-14 ENCOUNTER — Encounter (HOSPITAL_COMMUNITY): Payer: Self-pay | Admitting: Emergency Medicine

## 2015-03-14 DIAGNOSIS — T7840XA Allergy, unspecified, initial encounter: Secondary | ICD-10-CM | POA: Diagnosis not present

## 2015-03-14 DIAGNOSIS — I10 Essential (primary) hypertension: Secondary | ICD-10-CM | POA: Diagnosis not present

## 2015-03-14 LAB — POCT I-STAT, CHEM 8
BUN: 10 mg/dL (ref 6–23)
Calcium, Ion: 1.29 mmol/L — ABNORMAL HIGH (ref 1.12–1.23)
Chloride: 106 mmol/L (ref 96–112)
Creatinine, Ser: 0.8 mg/dL (ref 0.50–1.10)
Glucose, Bld: 98 mg/dL (ref 70–99)
HCT: 40 % (ref 36.0–46.0)
Hemoglobin: 13.6 g/dL (ref 12.0–15.0)
Potassium: 3.8 mmol/L (ref 3.5–5.1)
Sodium: 141 mmol/L (ref 135–145)
TCO2: 22 mmol/L (ref 0–100)

## 2015-03-14 MED ORDER — DEXAMETHASONE SODIUM PHOSPHATE 10 MG/ML IJ SOLN
INTRAMUSCULAR | Status: AC
  Filled 2015-03-14: qty 1

## 2015-03-14 MED ORDER — DEXAMETHASONE 4 MG PO TABS
10.0000 mg | ORAL_TABLET | Freq: Once | ORAL | Status: AC
  Administered 2015-03-14: 10 mg via ORAL

## 2015-03-14 MED ORDER — PREDNISONE 50 MG PO TABS: ORAL_TABLET | ORAL | Status: DC

## 2015-03-14 NOTE — ED Provider Notes (Signed)
CSN: [id number]     Arrival date & time 03/14/15  7035 History   None    Chief Complaint  Patient presents with  . Oral Swelling   (Consider location/radiation/quality/duration/timing/severity/associated sxs/prior Treatment) HPI   Oral swelling: Current episode started 2 days ago. Associated w/ oral itching and swelling. Benadryl and lip balm w./ improvement. Gettign better but continues to be swollena dn itchy. Similar episode 2-3 wks ago. This episode is described as being worse than the previous. Denies chest pain, shortness of breath, palpitations, dysphagia, decreased articulation or tongue swelling, headache, nausea, vomiting, fevers. Patient without any knowledge of food ingestion that she is allergic to or that is different than her typical. Denies diffuse itchy rash or neck stiffness. Started new BP medication 4 wks ago - [employer], newspaper.   Past Medical History  Diagnosis Date  . Hypertension   . Hyperlipidemia    Past Surgical History  Procedure Laterality Date  . Tubal ligation    . Wisdom teeth  2000   Family History  Problem Relation Age of Onset  . Heart disease Father   . Cancer Neg Hx   . Alcohol abuse Neg Hx   . COPD Neg Hx   . Diabetes Neg Hx   . Depression Neg Hx   . Drug abuse Neg Hx   . Early death Neg Hx   . Hearing loss Neg Hx   . Hyperlipidemia Neg Hx   . Hypertension Neg Hx   . Kidney disease Neg Hx   . Stroke Neg Hx    History  Substance Use Topics  . Smoking status: Never Smoker   . Smokeless tobacco: Never Used  . Alcohol Use: No   OB History    Gravida Para Term Preterm AB TAB SAB Ectopic Multiple Living   3 2             Review of Systems Per HPI with all other pertinent systems negative.   Allergies  Amlodipine  Home Medications   Prior to Admission medications   Medication Sig Start Date End Date Taking? Authorizing Provider  cholecalciferol (VITAMIN D) 400 UNITS TABS Take 5,000 Units by mouth.    Historical Provider, MD   esomeprazole (NEXIUM) 40 MG capsule Take 1 capsule (40 mg total) by mouth daily. 05/15/14   [name], MD  ferrous sulfate 325 (65 FE) MG tablet Take 1 tablet (325 mg total) by mouth 2 (two) times daily with a meal. 12/20/14   [name], MD  hydrochlorothiazide (MICROZIDE) 12.5 MG capsule Take 1 capsule (12.5 mg total) by mouth daily. 12/16/14   [name], MD  hydrOXYzine (ATARAX/VISTARIL) 10 MG tablet Take 1 tablet (10 mg total) by mouth 3 (three) times daily as needed. 02/18/15   Hendricks Limes, MD  KLOR-CON M20 20 MEQ tablet TAKE 1 TABLET (20 MEQ TOTAL) BY MOUTH 2 (TWO) TIMES DAILY. 07/17/14   [name], MD  Linaclotide [facility]) 145 MCG CAPS capsule Take 1 capsule (145 mcg total) by mouth daily. 01/09/14   [name], MD  nebivolol (BYSTOLIC) 10 MG tablet Take 1 tablet (10 mg total) by mouth daily. 02/05/15   [name], MD  Vitamin D, Ergocalciferol, (DRISDOL) 50000 UNITS CAPS capsule  01/17/14   Historical Provider, MD   LMP 02/14/2015 Physical Exam Physical Exam  Constitutional: oriented to person, place, and time. appears well-developed and well-nourished. No distress.  HENT:  Lips with minimal swelling and stiffness. Oropharynx clear. Cracks in  the corners of the mouth without ulcerations or herpetic type lesions. Airway patent. Head: Normocephalic and atraumatic.  Eyes: EOMI. PERRL.  Neck: Normal range of motion.  Cardiovascular: RRR, no m/r/g, 2+ distal pulses,  Pulmonary/Chest: Effort normal and breath sounds normal. No respiratory distress.  Abdominal: Soft. Bowel sounds are normal. NonTTP, no distension.  Musculoskeletal: Normal range of motion. Non ttp, no effusion.  Neurological: alert and oriented to person, place, and time.  Skin: Skin is warm. No rash noted. non diaphoretic.  Psychiatric: normal mood and affect. behavior is normal. Judgment and thought content normal.   ED Course  Procedures (including critical care time) Labs Review Labs  Reviewed - No data to display  Imaging Review No results found.   MDM   1. Allergic reaction, initial encounter   2. Essential hypertension    Patient did not take her bystolic, but did take HCTZ. Reports history of hypokalemia and takes potassium twice daily. Potassium normal and chem 8 Patient to double her hydrochlorothiazide dose and stop by systolic as this may be the cause of her allergic reaction. Patient to double her potassium dose and follow-up with her PCP in 1 week for labs and a blood pressure check. Decadron 10 mg given to improve condition Start 5 days of steroids Benadryl at home Precautions given and all course and answered  Linna Darner, MD Family Medicine 03/14/2015, 11:19 AM      Waldemar Dickens, MD 03/14/15 1120

## 2015-03-14 NOTE — ED Notes (Signed)
Patient c/o lip swelling x 2 days. Patient reports she has been using benadryl and hydroxyzine. Patient reports she has been taking a new Rx Bystolic for hypertension. She reports she had a similar reaction when she first starting taking RX. Patient is in NAD.

## 2015-03-14 NOTE — Discharge Instructions (Signed)
Please stop the Bystolic Please double your hydrochlorothiazide dose Please double your potassium dose Please follow-up with your primary care physician in one week for labs and a blood pressure recheck You were given a high-dose of steroids in our office to help with the swelling. Please continue your steroids every day for the next 5 days. Please use Benadryl for additional itching relief. Please go to the emergency room if you get worse.

## 2015-03-21 ENCOUNTER — Encounter: Payer: Self-pay | Admitting: Internal Medicine

## 2015-03-21 ENCOUNTER — Ambulatory Visit (INDEPENDENT_AMBULATORY_CARE_PROVIDER_SITE_OTHER): Payer: 59 | Admitting: Internal Medicine

## 2015-03-21 VITALS — BP 110/88 | HR 108 | Temp 98.5°F | Resp 16 | Wt 197.0 lb

## 2015-03-21 DIAGNOSIS — J209 Acute bronchitis, unspecified: Secondary | ICD-10-CM

## 2015-03-21 DIAGNOSIS — I10 Essential (primary) hypertension: Secondary | ICD-10-CM

## 2015-03-21 MED ORDER — HYDROCODONE-HOMATROPINE 5-1.5 MG/5ML PO SYRP: 5.0000 mL | ORAL_SOLUTION | Freq: Three times a day (TID) | ORAL | Status: DC | PRN

## 2015-03-21 MED ORDER — AZITHROMYCIN 500 MG PO TABS: 500.0000 mg | ORAL_TABLET | Freq: Every day | ORAL | Status: DC

## 2015-03-21 NOTE — Progress Notes (Signed)
Pre visit review using our clinic review tool, if applicable. No additional management support is needed unless otherwise documented below in the visit note. 

## 2015-03-21 NOTE — Patient Instructions (Signed)

## 2015-03-23 NOTE — Assessment & Plan Note (Signed)
Will treat the infection with zithromax and will control the cough with hycodan 

## 2015-03-23 NOTE — Progress Notes (Signed)
   Subjective:    Patient ID: Samantha Ball, female    DOB: [birth year], 39 y.o.   MRN: [id number]  Cough This is a new problem. The current episode started in the past 7 days (4 days ago). The problem has been unchanged. The problem occurs every few hours. The cough is productive of brown sputum. Associated symptoms include chills, rhinorrhea and sweats. Pertinent negatives include no chest pain, ear congestion, ear pain, fever, headaches, heartburn, hemoptysis, myalgias, nasal congestion, postnasal drip, rash, sore throat, shortness of breath, weight loss or wheezing. She has tried OTC cough suppressant for the symptoms. The treatment provided mild relief. There is no history of asthma, bronchiectasis, bronchitis, COPD, emphysema, environmental allergies or pneumonia.      Review of Systems  Constitutional: Positive for chills. Negative for fever, weight loss, diaphoresis, appetite change and fatigue.  HENT: Positive for rhinorrhea. Negative for congestion, ear pain, postnasal drip, sinus pressure, sore throat and trouble swallowing.   Eyes: Negative.   Respiratory: Positive for cough. Negative for apnea, hemoptysis, choking, chest tightness, shortness of breath, wheezing and stridor.   Cardiovascular: Negative.  Negative for chest pain, palpitations and leg swelling.  Gastrointestinal: Negative.  Negative for heartburn, nausea, vomiting, abdominal pain, diarrhea, constipation and blood in stool.  Endocrine: Negative.   Genitourinary: Negative.   Musculoskeletal: Negative.  Negative for myalgias, back pain, arthralgias and neck pain.  Skin: Negative.  Negative for rash.  Allergic/Immunologic: Negative.  Negative for environmental allergies.  Neurological: Negative.  Negative for headaches.  Hematological: Negative.  Negative for adenopathy. Does not bruise/bleed easily.  Psychiatric/Behavioral: Negative.        Objective:   Physical Exam  Constitutional: She is oriented to person,  place, and time. She appears well-developed and well-nourished. No distress.  HENT:  Head: Normocephalic and atraumatic.  Mouth/Throat: Oropharynx is clear and moist. No oropharyngeal exudate.  Eyes: Conjunctivae are normal. Right eye exhibits no discharge. Left eye exhibits no discharge. No scleral icterus.  Neck: Normal range of motion. Neck supple. No JVD present. No tracheal deviation present. No thyromegaly present.  Cardiovascular: Normal rate, regular rhythm, normal heart sounds and intact distal pulses.  Exam reveals no gallop and no friction rub.   No murmur heard. Pulmonary/Chest: Effort normal and breath sounds normal. No stridor. No respiratory distress. She has no wheezes. She has no rales. She exhibits no tenderness.  Abdominal: Soft. Bowel sounds are normal. She exhibits no distension and no mass. There is no tenderness. There is no rebound and no guarding.  Musculoskeletal: Normal range of motion. She exhibits no edema or tenderness.  Lymphadenopathy:    She has no cervical adenopathy.  Neurological: She is oriented to person, place, and time.  Skin: Skin is warm and dry. No rash noted. She is not diaphoretic. No erythema. No pallor.  Vitals reviewed.    Lab Results  Component Value Date   WBC 9.8 12/17/2014   HGB 13.6 03/14/2015   HCT 40.0 03/14/2015   PLT 299.0 12/17/2014   GLUCOSE 98 03/14/2015   CHOL 180 07/11/2013   TRIG 67.0 07/11/2013   HDL 51.80 07/11/2013   LDLCALC 115* 07/11/2013   ALT 22 01/09/2014   AST 21 01/09/2014   NA 141 03/14/2015   K 3.8 03/14/2015   CL 106 03/14/2015   CREATININE 0.80 03/14/2015   BUN 10 03/14/2015   CO2 25 12/16/2014   TSH 1.46 12/16/2014       Assessment & Plan:

## 2015-03-23 NOTE — Assessment & Plan Note (Signed)
Her BP is well controlled 

## 2015-05-21 LAB — HM PAP SMEAR

## 2015-06-13 ENCOUNTER — Other Ambulatory Visit: Payer: Self-pay

## 2015-06-13 DIAGNOSIS — K2100 Gastro-esophageal reflux disease with esophagitis, without bleeding: Secondary | ICD-10-CM

## 2015-06-13 MED ORDER — ESOMEPRAZOLE MAGNESIUM 40 MG PO CPDR: 40.0000 mg | DELAYED_RELEASE_CAPSULE | Freq: Every day | ORAL | Status: DC

## 2015-08-29 ENCOUNTER — Other Ambulatory Visit (INDEPENDENT_AMBULATORY_CARE_PROVIDER_SITE_OTHER): Payer: 59

## 2015-08-29 ENCOUNTER — Encounter: Payer: Self-pay | Admitting: Internal Medicine

## 2015-08-29 ENCOUNTER — Ambulatory Visit (INDEPENDENT_AMBULATORY_CARE_PROVIDER_SITE_OTHER): Payer: 59 | Admitting: Internal Medicine

## 2015-08-29 VITALS — BP 130/88 | HR 76 | Temp 98.2°F | Ht 64.0 in | Wt 202.0 lb

## 2015-08-29 DIAGNOSIS — I10 Essential (primary) hypertension: Secondary | ICD-10-CM | POA: Diagnosis not present

## 2015-08-29 DIAGNOSIS — R109 Unspecified abdominal pain: Secondary | ICD-10-CM | POA: Diagnosis not present

## 2015-08-29 DIAGNOSIS — R351 Nocturia: Secondary | ICD-10-CM

## 2015-08-29 LAB — URINALYSIS, ROUTINE W REFLEX MICROSCOPIC
Bilirubin Urine: NEGATIVE
Ketones, ur: NEGATIVE
Leukocytes, UA: NEGATIVE
Nitrite: NEGATIVE
Specific Gravity, Urine: 1.015 (ref 1.000–1.030)
Total Protein, Urine: NEGATIVE
Urine Glucose: NEGATIVE
Urobilinogen, UA: 0.2 (ref 0.0–1.0)
pH: 6 (ref 5.0–8.0)

## 2015-08-29 MED ORDER — HYDROCHLOROTHIAZIDE 12.5 MG PO CAPS: 12.5000 mg | ORAL_CAPSULE | Freq: Every day | ORAL | Status: DC

## 2015-08-29 NOTE — Patient Instructions (Signed)
Avoid ingestion of  excess salt/sodium.Cook with pepper & other spices . Use the salt substitute "No Salt" OR the Mrs Deliah Boston products to season food @ the table. Avoid foods which taste salty or "vinegary" as their sodium content will be high. Minimal Blood Pressure Goal= AVERAGE < 140/90;  Ideal is an AVERAGE < 135/85. This AVERAGE should be calculated from @ least 5-7 BP readings taken @ different times of day on different days of week. You should not respond to isolated BP readings , but rather the AVERAGE for that week .Please bring your  blood pressure cuff to office visits to verify that it is reliable.It  can also be checked against the blood pressure device at the pharmacy. Finger or wrist cuffs are not dependable; an arm cuff is.  Fill the  prescription for the BP medication if BP NOT @ goal based on  7 to 14 day average.

## 2015-08-29 NOTE — Progress Notes (Signed)
   Subjective:    Patient ID: Samantha Ball, female    DOB: [birth year], 39 y.o.   MRN: [id number]  HPI The patient is here to assess status of active health conditions.  PMH, FH, & Social History reviewed & updated.No change in Oliver as recorded.  She eats fried foods and salt. She had been working out 3 times a week for an hour; but she has curtailed this in the last 2 weeks due to other commitments. She was having no cardio pulmonary symptoms with exercise.  She stoppeds her HCTZ in August. She was concerned she was taking too many medicines.  She has been prescribed Yaz by her Gynecologist, Dr. Raphael Gibney & questions whether she could take the HCTZ with this. She's not been monitoring her blood pressure at home.  She does have headache on average once a week.  She has nocturia usually once a night;this but occur up to 6 times. In the last 3 days she's had bilateral flank pain. She is having her menses. She questioned whether she might be having kidney stones; she has no history of such.  Her renal function was normal in March of this year. Glucose was 98.  Review of Systems She denies dysuria or pyuria. She has no fever, chills, or sweats.  Chest pain, palpitations, tachycardia, exertional dyspnea, paroxysmal nocturnal dyspnea, claudication or edema are absent.      Objective:   Physical Exam Pertinent or positive findings include: She has minimal arteriolar narrowing on fundal exam. Second heart sound is increased. Abdomen is protuberant with striae. She has no renal artery bruits. There is mild crepitus of the knees and some varus changes of the knees.  General appearance :adequately nourished; in no distress.  Eyes: No conjunctival inflammation or scleral icterus is present.  Oral exam:  Lips and gums are healthy appearing.There is no oropharyngeal erythema or exudate noted. Dental hygiene is good.  Heart:  Normal rate and regular rhythm. S1 normal without gallop, murmur,  click, rub or other extra sounds    Lungs:Chest clear to auscultation; no wheezes, rhonchi,rales ,or rubs present.No increased work of breathing.   Abdomen: bowel sounds normal, soft and non-tender without masses, organomegaly or hernias noted.  No guarding or rebound. No flank tenderness to percussion.  Vascular : all pulses equal ; no bruits present.  Skin:Warm & dry.  Intact without suspicious lesions or rashes ; no tenting   Lymphatic: No lymphadenopathy is noted about the head, neck, axilla.   Neuro: Strength, tone & DTRs normal.     Assessment & Plan:  #1 hypertension; monitor is necessary to determine degree of control  #2 bilateral flank pain. Renal calculi is not suspect as she has no history of calculi and symptoms are bilateral.. Rule out urinary tract infection.  #3 nocturia  Plan: See orders and after visit summary.

## 2015-08-29 NOTE — Progress Notes (Signed)
Pre visit review using our clinic review tool, if applicable. No additional management support is needed unless otherwise documented below in the visit note. 

## 2015-08-30 LAB — CULTURE, URINE COMPREHENSIVE
Colony Count: NO GROWTH
Organism ID, Bacteria: NO GROWTH

## 2015-09-01 ENCOUNTER — Telehealth: Payer: Self-pay | Admitting: Emergency Medicine

## 2015-09-01 MED ORDER — FLUCONAZOLE 150 MG PO TABS: 150.0000 mg | ORAL_TABLET | Freq: Once | ORAL | Status: DC

## 2015-09-01 NOTE — Telephone Encounter (Signed)
Diflucan sent to Pharm.

## 2016-02-04 ENCOUNTER — Ambulatory Visit (INDEPENDENT_AMBULATORY_CARE_PROVIDER_SITE_OTHER)
Admission: RE | Admit: 2016-02-04 | Discharge: 2016-02-04 | Disposition: A | Discharge status: Home / Self Care | Payer: 59 | Source: Ambulatory Visit | Attending: Internal Medicine | Admitting: Internal Medicine

## 2016-02-04 ENCOUNTER — Ambulatory Visit (INDEPENDENT_AMBULATORY_CARE_PROVIDER_SITE_OTHER): Payer: 59 | Admitting: Internal Medicine

## 2016-02-04 ENCOUNTER — Encounter: Payer: Self-pay | Admitting: Internal Medicine

## 2016-02-04 VITALS — BP 152/100 | HR 80 | Temp 98.3°F | Resp 16 | Ht 64.0 in | Wt 207.0 lb

## 2016-02-04 DIAGNOSIS — M659 Synovitis and tenosynovitis, unspecified: Secondary | ICD-10-CM

## 2016-02-04 DIAGNOSIS — M778 Other enthesopathies, not elsewhere classified: Secondary | ICD-10-CM | POA: Insufficient documentation

## 2016-02-04 DIAGNOSIS — Z23 Encounter for immunization: Secondary | ICD-10-CM

## 2016-02-04 DIAGNOSIS — J988 Other specified respiratory disorders: Secondary | ICD-10-CM | POA: Diagnosis not present

## 2016-02-04 DIAGNOSIS — I10 Essential (primary) hypertension: Secondary | ICD-10-CM

## 2016-02-04 DIAGNOSIS — M25521 Pain in right elbow: Secondary | ICD-10-CM

## 2016-02-04 DIAGNOSIS — G8929 Other chronic pain: Secondary | ICD-10-CM | POA: Diagnosis not present

## 2016-02-04 DIAGNOSIS — M25529 Pain in unspecified elbow: Secondary | ICD-10-CM

## 2016-02-04 DIAGNOSIS — J22 Unspecified acute lower respiratory infection: Secondary | ICD-10-CM

## 2016-02-04 MED ORDER — NAPROXEN 375 MG PO TABS: 375.0000 mg | ORAL_TABLET | Freq: Two times a day (BID) | ORAL | Status: DC

## 2016-02-04 MED ORDER — PROMETHAZINE-DM 6.25-15 MG/5ML PO SYRP: 5.0000 mL | ORAL_SOLUTION | Freq: Four times a day (QID) | ORAL | Status: DC | PRN

## 2016-02-04 MED ORDER — HYDROCHLOROTHIAZIDE 12.5 MG PO CAPS: 12.5000 mg | ORAL_CAPSULE | Freq: Every day | ORAL | Status: DC

## 2016-02-04 MED ORDER — AZITHROMYCIN 500 MG PO TABS: 500.0000 mg | ORAL_TABLET | Freq: Every day | ORAL | Status: DC

## 2016-02-04 NOTE — Progress Notes (Signed)
Subjective:  Patient ID: Samantha Ball, female    DOB: 1976/08/06  Age: 40 y.o. MRN: [id number]  CC: Hypertension; Cough; and Elbow Pain   HPI TAIONNA MYLIN presents for a blood pressure check, concerns about a cough, as well as right elbow pain for 1 month.  Her blood pressure is not been well controlled since she does not take the hydrochlorothiazide daily. She has no secondary signs of hypertension.  She also complains of a one-week history of upper respiratory symptoms with scratchy throat, nasal congestion, cough productive of thick yellow phlegm, chills and runny nose. She has not taken anything to control the symptoms.  She woke up about a month ago with right anterior elbow pain. She doesn't recall a trauma or injury. She has tried try Tylenol to control the pain without much relief.  Outpatient Prescriptions Prior to Visit  Medication Sig Dispense Refill  . esomeprazole (NEXIUM) 40 MG capsule Take 1 capsule (40 mg total) by mouth daily. (Patient taking differently: Take 40 mg by mouth daily as needed. ) 90 capsule 3  . LORYNA 3-0.02 MG tablet     . hydrochlorothiazide (MICROZIDE) 12.5 MG capsule Take 1 capsule (12.5 mg total) by mouth daily. (Patient taking differently: Take 12.5 mg by mouth daily as needed. ) 30 capsule 5  . Linaclotide (LINZESS) 145 MCG CAPS capsule Take 1 capsule (145 mcg total) by mouth daily. (Patient taking differently: Take 145 mcg by mouth daily as needed. ) 70 capsule 0  . fluconazole (DIFLUCAN) 150 MG tablet Take 1 tablet (150 mg total) by mouth once. 1 tablet 0   No facility-administered medications prior to visit.    ROS Review of Systems  Constitutional: Positive for chills. Negative for fever and fatigue.  HENT: Positive for congestion, postnasal drip, rhinorrhea and sore throat. Negative for sinus pressure, trouble swallowing and voice change.   Eyes: Negative.  Negative for photophobia and visual disturbance.  Respiratory: Positive for  cough. Negative for choking, chest tightness, shortness of breath and stridor.   Cardiovascular: Negative.  Negative for chest pain, palpitations and leg swelling.  Gastrointestinal: Negative.  Negative for nausea, vomiting, abdominal pain, diarrhea, constipation and blood in stool.  Endocrine: Negative.   Genitourinary: Negative.  Negative for dysuria and difficulty urinating.  Musculoskeletal: Positive for arthralgias. Negative for myalgias, back pain, gait problem and neck pain.  Skin: Negative.  Negative for color change and rash.  Allergic/Immunologic: Negative.   Neurological: Negative.  Negative for dizziness.  Hematological: Negative.  Does not bruise/bleed easily.  Psychiatric/Behavioral: Negative.     Objective:  BP 152/100 mmHg  Pulse 80  Temp(Src) 98.3 F (36.8 C) (Oral)  Resp 16  Ht 5\' 4"  (1.626 m)  Wt 207 lb (93.895 kg)  BMI 35.51 kg/m2  SpO2 97%  LMP 01/16/2016  BP Readings from Last 3 Encounters:  02/04/16 152/100  08/29/15 130/88  03/21/15 110/88    Wt Readings from Last 3 Encounters:  02/04/16 207 lb (93.895 kg)  08/29/15 202 lb (91.627 kg)  03/21/15 197 lb (89.359 kg)    Physical Exam  Constitutional: She is oriented to person, place, and time. She appears well-nourished. No distress.  HENT:  Mouth/Throat: Oropharynx is clear and moist and mucous membranes are normal. Mucous membranes are not pale and not cyanotic. No oral lesions. No trismus in the jaw. No oropharyngeal exudate or posterior oropharyngeal erythema.  Eyes: Conjunctivae are normal. Right eye exhibits no discharge. Left eye exhibits no discharge. No scleral  icterus.  Neck: Normal range of motion. Neck supple. No JVD present. No tracheal deviation present. No thyromegaly present.  Cardiovascular: Normal rate, regular rhythm, normal heart sounds and intact distal pulses.  Exam reveals no gallop and no friction rub.   No murmur heard. Pulmonary/Chest: Effort normal and breath sounds normal.  No stridor. No respiratory distress. She has no wheezes. She has no rales. She exhibits no tenderness.  Abdominal: Soft. Bowel sounds are normal. She exhibits no distension and no mass. There is no tenderness. There is no rebound and no guarding.  Musculoskeletal: Normal range of motion. She exhibits no edema.       Right elbow: She exhibits swelling. She exhibits normal range of motion, no effusion, no deformity and no laceration. Tenderness found.  Over the right elbow there is tenderness to palpation anteriorly over the insertion of the biceps tendon at the elbow.  Lymphadenopathy:    She has no cervical adenopathy.  Neurological: She is oriented to person, place, and time.  Skin: Skin is warm and dry. No rash noted. She is not diaphoretic. No erythema. No pallor.  Vitals reviewed.   Lab Results  Component Value Date   WBC 9.8 12/17/2014   HGB 13.6 03/14/2015   HCT 40.0 03/14/2015   PLT 299.0 12/17/2014   GLUCOSE 98 03/14/2015   CHOL 180 07/11/2013   TRIG 67.0 07/11/2013   HDL 51.80 07/11/2013   LDLCALC 115* 07/11/2013   ALT 22 01/09/2014   AST 21 01/09/2014   NA 141 03/14/2015   K 3.8 03/14/2015   CL 106 03/14/2015   CREATININE 0.80 03/14/2015   BUN 10 03/14/2015   CO2 25 12/16/2014   TSH 1.46 12/16/2014    No results found.  Assessment & Plan:   Correne was seen today for hypertension, cough and elbow pain.  Diagnoses and all orders for this visit:  Lower resp. tract infection- I will treat the infection with Zithromax and use Phenergan DM to control the cough -     promethazine-dextromethorphan (PROMETHAZINE-DM) 6.25-15 MG/5ML syrup; Take 5 mLs by mouth 4 (four) times daily as needed for cough. -     azithromycin (ZITHROMAX) 500 MG tablet; Take 1 tablet (500 mg total) by mouth daily.  Encounter for immunization -     Flu Vaccine QUAD 36+ mos IM  Essential hypertension, benign- her blood pressure is not well controlled due to noncompliance, she agrees to take the  hydrochlorothiazide daily. I will recheck her blood pressure in 2 months -     hydrochlorothiazide (MICROZIDE) 12.5 MG capsule; Take 1 capsule (12.5 mg total) by mouth daily.  Elbow pain, chronic, right- she has some degree of tendinitis but the plain x-ray also shows an effusion, will start Naprosyn for symptom relief and have asked her to see orthopedics for further evaluation. -     DG Elbow Complete Right; Future -     naproxen (NAPROSYN) 375 MG tablet; Take 1 tablet (375 mg total) by mouth 2 (two) times daily with a meal. -     Ambulatory referral to Orthopedic Surgery  Tendonitis of elbow, right- she will rest and ice her right elbow, will start naproxen, will also see orthopedics for further evaluation. -     naproxen (NAPROSYN) 375 MG tablet; Take 1 tablet (375 mg total) by mouth 2 (two) times daily with a meal. -     Ambulatory referral to Orthopedic Surgery   I have discontinued Ms. Schanz's Linaclotide and fluconazole. I am also  having her start on promethazine-dextromethorphan, azithromycin, and naproxen. Additionally, I am having her maintain her esomeprazole, LORYNA, and hydrochlorothiazide.  Meds ordered this encounter  Medications  . hydrochlorothiazide (MICROZIDE) 12.5 MG capsule    Sig: Take 1 capsule (12.5 mg total) by mouth daily.    Dispense:  30 capsule    Refill:  5  . promethazine-dextromethorphan (PROMETHAZINE-DM) 6.25-15 MG/5ML syrup    Sig: Take 5 mLs by mouth 4 (four) times daily as needed for cough.    Dispense:  118 mL    Refill:  0  . azithromycin (ZITHROMAX) 500 MG tablet    Sig: Take 1 tablet (500 mg total) by mouth daily.    Dispense:  3 tablet    Refill:  0  . naproxen (NAPROSYN) 375 MG tablet    Sig: Take 1 tablet (375 mg total) by mouth 2 (two) times daily with a meal.    Dispense:  60 tablet    Refill:  1     Follow-up: Return in about 2 months (around 04/03/2016).  Scarlette Calico, MD

## 2016-02-04 NOTE — Patient Instructions (Signed)
Hypertension Hypertension, commonly called high blood pressure, is when the force of blood pumping through your arteries is too strong. Your arteries are the blood vessels that carry blood from your heart throughout your body. A blood pressure reading consists of a higher number over a lower number, such as 110/72. The higher number (systolic) is the pressure inside your arteries when your heart pumps. The lower number (diastolic) is the pressure inside your arteries when your heart relaxes. Ideally you want your blood pressure below 120/80. Hypertension forces your heart to work harder to pump blood. Your arteries may become narrow or stiff. Having untreated or uncontrolled hypertension can cause heart attack, stroke, kidney disease, and other problems. RISK FACTORS Some risk factors for high blood pressure are controllable. Others are not.  Risk factors you cannot control include:   Race. You may be at higher risk if you are African American.  Age. Risk increases with age.  Gender. Men are at higher risk than women before age 45 years. After age 65, women are at higher risk than men. Risk factors you can control include:  Not getting enough exercise or physical activity.  Being overweight.  Getting too much fat, sugar, calories, or salt in your diet.  Drinking too much alcohol. SIGNS AND SYMPTOMS Hypertension does not usually cause signs or symptoms. Extremely high blood pressure (hypertensive crisis) may cause headache, anxiety, shortness of breath, and nosebleed. DIAGNOSIS To check if you have hypertension, your health care provider will measure your blood pressure while you are seated, with your arm held at the level of your heart. It should be measured at least twice using the same arm. Certain conditions can cause a difference in blood pressure between your right and left arms. A blood pressure reading that is higher than normal on one occasion does not mean that you need treatment. If  it is not clear whether you have high blood pressure, you may be asked to return on a different day to have your blood pressure checked again. Or, you may be asked to monitor your blood pressure at home for 1 or more weeks. TREATMENT Treating high blood pressure includes making lifestyle changes and possibly taking medicine. Living a healthy lifestyle can help lower high blood pressure. You may need to change some of your habits. Lifestyle changes may include:  Following the DASH diet. This diet is high in fruits, vegetables, and whole grains. It is low in salt, red meat, and added sugars.  Keep your sodium intake below 2,300 mg per day.  Getting at least 30-45 minutes of aerobic exercise at least 4 times per week.  Losing weight if necessary.  Not smoking.  Limiting alcoholic beverages.  Learning ways to reduce stress. Your health care provider may prescribe medicine if lifestyle changes are not enough to get your blood pressure under control, and if one of the following is true:  You are 18-59 years of age and your systolic blood pressure is above 140.  You are 60 years of age or older, and your systolic blood pressure is above 150.  Your diastolic blood pressure is above 90.  You have diabetes, and your systolic blood pressure is over 140 or your diastolic blood pressure is over 90.  You have kidney disease and your blood pressure is above 140/90.  You have heart disease and your blood pressure is above 140/90. Your personal target blood pressure may vary depending on your medical conditions, your age, and other factors. HOME CARE INSTRUCTIONS    Have your blood pressure rechecked as directed by your health care provider.   Take medicines only as directed by your health care provider. Follow the directions carefully. Blood pressure medicines must be taken as prescribed. The medicine does not work as well when you skip doses. Skipping doses also puts you at risk for  problems.  Do not smoke.   Monitor your blood pressure at home as directed by your health care provider. SEEK MEDICAL CARE IF:   You think you are having a reaction to medicines taken.  You have recurrent headaches or feel dizzy.  You have swelling in your ankles.  You have trouble with your vision. SEEK IMMEDIATE MEDICAL CARE IF:  You develop a severe headache or confusion.  You have unusual weakness, numbness, or feel faint.  You have severe chest or abdominal pain.  You vomit repeatedly.  You have trouble breathing. MAKE SURE YOU:   Understand these instructions.  Will watch your condition.  Will get help right away if you are not doing well or get worse.   This information is not intended to replace advice given to you by your health care provider. Make sure you discuss any questions you have with your health care provider.   Document Released: 12/06/2005 Document Revised: 04/22/2015 Document Reviewed: 09/28/2013 Elsevier Interactive Patient Education 2016 [employer]  

## 2016-02-04 NOTE — Progress Notes (Signed)
Pre visit review using our clinic review tool, if applicable. No additional management support is needed unless otherwise documented below in the visit note. 

## 2016-02-19 ENCOUNTER — Encounter: Payer: Self-pay | Admitting: Family

## 2016-02-19 ENCOUNTER — Ambulatory Visit (INDEPENDENT_AMBULATORY_CARE_PROVIDER_SITE_OTHER): Payer: 59 | Admitting: Family

## 2016-02-19 VITALS — BP 160/100 | HR 92 | Temp 97.6°F | Resp 16 | Ht 64.0 in | Wt 199.0 lb

## 2016-02-19 DIAGNOSIS — M778 Other enthesopathies, not elsewhere classified: Secondary | ICD-10-CM | POA: Insufficient documentation

## 2016-02-19 DIAGNOSIS — M779 Enthesopathy, unspecified: Principal | ICD-10-CM

## 2016-02-19 MED ORDER — DICLOFENAC SODIUM 2 % TD SOLN: 1.0000 "application " | Freq: Two times a day (BID) | TRANSDERMAL | Status: DC | PRN

## 2016-02-19 NOTE — Assessment & Plan Note (Signed)
Symptoms and exam consistent with triceps tendinitis or bursitis which appears to be improving with conservative treatment. Continue current dosage of naproxen. Start Pennsiad. Continue with ice and home exercise therapy. Follow up in 2-3 weeks if symptoms worsened or fail to improve.

## 2016-02-19 NOTE — Patient Instructions (Signed)
Thank you for choosing [facility].  Summary/Instructions:  Ice 2-3 times per day. Continue to take the naproxen.  Pennsaid - Pinkie sized dose twice daily. Stretching and exercises daily. Follow up in 2 weeks.   Your prescription(s) have been submitted to your pharmacy or been printed and provided for you. Please take as directed and contact our office if you believe you are having problem(s) with the medication(s) or have any questions.  If your symptoms worsen or fail to improve, please contact our office for further instruction, or in case of emergency go directly to the emergency room at the closest medical facility.   Triceps Tendinitis with Rehab Triceps tendinitis usually results in a ligament sprain (tear). The triceps tendon attaches the elbow to the triceps muscle on the back of the arm. It prevents the elbow from bending too far outward. Sprains are classified into three categories. Grade 1 sprains cause pain, but the tendon is not lengthened. Grade 2 sprains include a lengthened ligament due to the ligament being stretched or partially ruptured. With grade 2 sprains there is still function, although the function may be diminished. Grade 3 sprains are characterized by a complete tear of the tendon or muscle and function is usually impaired. SYMPTOMS   Pain, tenderness, swelling, and/or bruising over the site of injury (contusion).  Pain that worsens with elbow movement, such as push-ups.  A "pop" or tear felt or heard at the time of injury.  Decreased elbow function and/or grip strength. CAUSES   Overuse of triceps muscles and tendons.  Injury, laceration or direct blow to the triceps tendon. RISK INCREASES WITH:  Activities in which falling is likely.  Weightlifting and push-ups.  Poor strength and flexibility.  Steroid use. PREVENTION  Warm up and stretch properly before activity.  Maintain physical fitness:  Strength, flexibility, and  endurance.  Cardiovascular fitness.  Learn and use proper technique. When possible, have coach correct improper technique.  Functional braces may be effective in preventing injury, especially re-injury, in contact sports. PROGNOSIS  Triceps sprains usually heal in 6 weeks with proper treatment and rest. RELATED COMPLICATIONS  Tendon rupture requiring surgery.  Loss of motion in the elbow  Prolonged healing time, if improperly treated or re-injured. TREATMENT  Treatment initially involves resting from any activities that aggravate the symptoms, and the use of ice and medications to help reduce pain and inflammation. Referral to a therapist for further evaluation and treatment may be enough for a full recovery. If rehabilitation alone is insufficient for resolving the injury, then surgery may be necessary to use other tissue to recreate (reconstruct) the torn tendon. After surgery, immobilization of the elbow is necessary to allow for healing. After immobilization it is important to perform strengthening and stretching exercises to help regain strength and a full range of motion. These exercises may be completed at home or with a therapist. Your therapist will decide when you may return to sports. MEDICATION   If pain medication is necessary, then nonsteroidal anti-inflammatory medications, such as aspirin and ibuprofen, or other minor pain relievers, such as acetaminophen, are often recommended.  Do not take pain medication for 7 days before surgery.  Prescription pain relievers may be given if deemed necessary by your caregiver. Use only as directed and only as much as you need. HEAT AND COLD  Cold treatment (icing) relieves pain and reduces inflammation. Cold treatment should be applied for 10 to 15 minutes every 2 to 3 hours for inflammation and pain and immediately  after any activity that aggravates your symptoms. Use ice packs or massage the area with a piece of ice (ice  massage).  Heat treatment may be used prior to performing the stretching and strengthening activities prescribed by your caregiver, physical therapist, or athletic trainer. Use a heat pack or soak your injury in warm water. SEEK MEDICAL CARE IF:  Treatment seems to offer no benefit, or the condition worsens.  Any medications produce adverse side effects.  Any complications from surgery occur:  Pain, numbness, or coldness in the extremity operated upon.  Discoloration of the nail beds (they become blue or gray) of the extremity operated upon.  Signs of infections (fever, pain, inflammation, redness, or persistent bleeding). EXERCISES RANGE OF MOTION (ROM) AND STRETCHING EXERCISES - Triceps Tendinitis These exercises may help you when beginning to rehabilitate your injury. Your symptoms may resolve with or without further involvement from your physician, physical therapist or athletic trainer. While completing these exercises, remember:   Restoring tissue flexibility helps normal motion to return to the joints. This allows healthier, less painful movement and activity.  An effective stretch should be held for at least 30 seconds.  A stretch should never be painful. You should only feel a gentle lengthening or release in the stretched tissue. RANGE OF MOTION - Flexion  Hold your right / left arm at your side and bend your elbow as far as you can using your right / left arm muscles.  Bend the right / left elbow farther by gently pushing up on your forearm until you feel a gentle stretch on the outside of your elbow. Hold this position for __________ seconds.  Slowly return to the starting position. Repeat __________ times. Complete this exercise __________ times per day.  RANGE OF MOTION - Elbow Flexion, Supine   Lie on your back. Extend your right / left arm into the air, bracing it with your opposite hand. Allow your right / left arm to relax.  Let your elbow bend, allowing your  hand fall slowly toward your chest.  You should feel a gentle stretch along the back of your upper arm and/or elbow. Your physician, physical therapist or athletic trainer may ask you to hold a __________ hand weight to increase the intensity of this stretch.  Hold for __________ seconds. Slowly return your right / left arm to the upright position. Repeat __________ times. Complete this exercise __________ times per day. STRETCH - Elbow Flexors   Lie on a firm bed or countertop on your back. Be sure that you are in a comfortable position which will allow you to relax your arm muscles.  Place a folded towel under your upper arm so that your elbow and shoulder are at the same height. Extend your arm; your elbow should not rest on the bed or towel  Allow the weight of your hand to straighten your elbow. Keep your arm and chest muscles relaxed. Your caretaker may ask you to increase the intensity of your stretch by adding a small wrist or hand weight.  Hold for __________ seconds. You should feel a stretch on the inside of your elbow. Slowly return to the starting position. Repeat __________ times. Complete this exercise __________ times per day. STRENGTHENING EXERCISES - Triceps Tendinitis These exercises will help you regain your strength. These exercises may resolve your symptoms with or without further involvement from your physician, physical therapist or athletic trainer. While completing these exercises, remember:   Muscles can gain both the endurance and the strength  needed for everyday activities through controlled exercises.  Complete these exercises as instructed by your physician, physical therapist or athletic trainer. Progress with the resistance and repetition exercises only as your caregiver advises.  You may experience muscle soreness or fatigue, but the pain or discomfort you are trying to eliminate should never worsen during these exercises. If this pain does worsen, stop and  make certain you are following the directions exactly. If the pain is still present after adjustments, discontinue the exercise until you can discuss the trouble with your clinician. STRENGTH - Elbow Extensors, Isometric  Stand or sit upright on a firm surface. Place your right / left arm so that your palm faces your abdomen and it is at the height of your waist.  Place your opposite hand on the underside of your forearm. Gently push up as your right / left arm resists. Push as hard as you can with both arms without causing any pain or movement at your right / left elbow. Hold this stationary position for __________ seconds.  Gradually release the tension in both arms. Allow your muscles to relax completely before repeating. Repeat __________ times. Complete this exercise __________ times per day. STRENGTH - Elbow Flexors, Supinated  With good posture, stand or sit on a firm chair without armrests. Allow your right / left arm to rest at your side with your palm facing forward.  Holding a __________ weight or gripping a rubber exercise band/tubing, bring your hand toward your shoulder.  Allow your muscles to control the resistance as your hand returns to your side. Repeat __________ times. Complete this exercise __________ times per day.  STRENGTH - Elbow Flexors, Neutral  With good posture, stand or sit on a firm chair without armrests. Allow your right / left arm to rest at your side with your thumb facing forward.  Holding a __________ weight or gripping a rubber exercise band/tubing, bring your hand toward your shoulder.  Allow your muscles to control the resistance as your hand returns to your side. Repeat __________ times. Complete this exercise __________ times per day.  STRENGTH - Elbow Extensors  Lie on your back. Extend your right / left elbow into the air, pointing it toward the ceiling. Brace your arm with your opposite hand.*  Holding a __________ weight in your hand, slowly  straighten your right / left elbow.  Allow your muscles to control the weight as your hand returns to its starting position. Repeat __________ times. Complete this exercise __________ times per day. *You may also stand with your elbow overhead and pointed toward the ceiling and supported by your opposite hand. STRENGTH - Elbow Extensors, Dynamic  With good posture, stand or sit on a firm chair without armrests. Keeping your upper arms at your side, bring both hands up to your right / left shoulder while gripping a rubber exercise band/tubing. Your right / left hand should be just below the other hand.  Straighten your right / left elbow. Hold for __________ seconds.  Allow your muscles to control the rubber exercise band/tubing as your hand returns to your shoulder. Repeat __________ times. Complete this exercise __________ times per day.   This information is not intended to replace advice given to you by your health care provider. Make sure you discuss any questions you have with your health care provider.   Document Released: 12/06/2005 Document Revised: 02/28/2012 Document Reviewed: 03/20/2009 Elsevier Interactive Patient Education [employer].

## 2016-02-19 NOTE — Progress Notes (Signed)
Subjective:    Patient ID: Samantha Ball, female    DOB: [birth year], 40 y.o.   MRN: [id number]  Chief Complaint  Patient presents with  . Elbow Pain    Right elbow pain x6 weeks, it hurts to extand arm all the way, making certain movements hurt    HPI:  Samantha Ball is a 40 y.o. female who  has a past medical history of Hypertension and Hyperlipidemia. and presents today for a follow up office visit.   Previously seen in the office for right sided anterior elbow pain that started approximately 1 month ago upon awakening with no trauma noted. Physical exam with tenderness to palpation over the insertion of the biceps tendon. X-rays showed an elbow effusion with no evidence of fracture or acute abnormality. She was prescribed naproxen and referred to orthopedics. Continues to experience the associated symptom of pain located in the anterior aspect of her elbow. Pain is described as an achy pain with a severity that is moderate. Modifying factors include naproxen which has helped with her pain. Denies numbness or tingling. Does have some pain when opening doors. Does have some shoulder pain. She is right hand dominant.    Allergies  Allergen Reactions  . Amlodipine     constipation     Current Outpatient Prescriptions on File Prior to Visit  Medication Sig Dispense Refill  . esomeprazole (NEXIUM) 40 MG capsule Take 1 capsule (40 mg total) by mouth daily. (Patient taking differently: Take 40 mg by mouth daily as needed. ) 90 capsule 3  . hydrochlorothiazide (MICROZIDE) 12.5 MG capsule Take 1 capsule (12.5 mg total) by mouth daily. 30 capsule 5  . naproxen (NAPROSYN) 375 MG tablet Take 1 tablet (375 mg total) by mouth 2 (two) times daily with a meal. 60 tablet 1   No current facility-administered medications on file prior to visit.    Review of Systems  Musculoskeletal:       Positive for right elbow pain.  Neurological: Negative for weakness and numbness.      Objective:    BP 160/100 mmHg  Pulse 92  Temp(Src) 97.6 F (36.4 C) (Oral)  Resp 16  Ht 5\' 4"  (1.626 m)  Wt 199 lb (90.266 kg)  BMI 34.14 kg/m2  SpO2 99%  LMP 01/16/2016 Nursing note and vital signs reviewed.   Physical Exam  Constitutional: She is oriented to person, place, and time. She appears well-developed and well-nourished. No distress.  Cardiovascular: Normal rate, regular rhythm, normal heart sounds and intact distal pulses.   Pulmonary/Chest: Effort normal and breath sounds normal.  Musculoskeletal:  Right elbow - no obvious deformity or discoloration. There is mild edema of posterior elbow and tenderness posterior to superior to olecranon process mild amount of edema felt. No crepitus or deformity. Negative valgus, varus. Distal pulses and sensation are intact and appropriate.   Neurological: She is alert and oriented to person, place, and time.  Skin: Skin is warm and dry.  Psychiatric: She has a normal mood and affect. Her behavior is normal. Judgment and thought content normal.       Assessment & Plan:   Problem List Items Addressed This Visit      Musculoskeletal and Integument   Triceps tendonitis - Primary    Symptoms and exam consistent with triceps tendinitis or bursitis which appears to be improving with conservative treatment. Continue current dosage of naproxen. Start Pennsiad. Continue with ice and home exercise therapy. Follow up in 2-3 weeks if  symptoms worsened or fail to improve.       Relevant Medications   Diclofenac Sodium (PENNSAID) 2 % SOLN

## 2016-02-20 ENCOUNTER — Ambulatory Visit: Payer: 59 | Admitting: Family Medicine

## 2016-03-09 ENCOUNTER — Other Ambulatory Visit: Payer: Self-pay | Admitting: Internal Medicine

## 2016-03-10 NOTE — Telephone Encounter (Signed)
Patient's last OV for reflux was jan/2015---are you ok with refilling?---please advise, thanks

## 2016-04-17 ENCOUNTER — Other Ambulatory Visit: Payer: Self-pay | Admitting: Internal Medicine

## 2016-06-01 ENCOUNTER — Other Ambulatory Visit (INDEPENDENT_AMBULATORY_CARE_PROVIDER_SITE_OTHER): Payer: 59

## 2016-06-01 ENCOUNTER — Encounter: Payer: Self-pay | Admitting: Internal Medicine

## 2016-06-01 ENCOUNTER — Ambulatory Visit (INDEPENDENT_AMBULATORY_CARE_PROVIDER_SITE_OTHER): Payer: 59 | Admitting: Internal Medicine

## 2016-06-01 VITALS — BP 130/90 | HR 82 | Temp 98.4°F | Resp 16 | Ht 64.0 in | Wt 207.0 lb

## 2016-06-01 DIAGNOSIS — R768 Other specified abnormal immunological findings in serum: Secondary | ICD-10-CM

## 2016-06-01 DIAGNOSIS — IMO0001 Reserved for inherently not codable concepts without codable children: Secondary | ICD-10-CM

## 2016-06-01 DIAGNOSIS — G4700 Insomnia, unspecified: Secondary | ICD-10-CM

## 2016-06-01 DIAGNOSIS — M791 Myalgia: Secondary | ICD-10-CM | POA: Diagnosis not present

## 2016-06-01 DIAGNOSIS — M609 Myositis, unspecified: Secondary | ICD-10-CM | POA: Diagnosis not present

## 2016-06-01 DIAGNOSIS — D508 Other iron deficiency anemias: Secondary | ICD-10-CM | POA: Diagnosis not present

## 2016-06-01 DIAGNOSIS — K5900 Constipation, unspecified: Secondary | ICD-10-CM | POA: Diagnosis not present

## 2016-06-01 LAB — CBC WITH DIFFERENTIAL/PLATELET
Basophils Absolute: 0 10*3/uL (ref 0.0–0.1)
Basophils Relative: 0.5 % (ref 0.0–3.0)
Eosinophils Absolute: 0.1 10*3/uL (ref 0.0–0.7)
Eosinophils Relative: 1.5 % (ref 0.0–5.0)
HCT: 37.1 % (ref 36.0–46.0)
Hemoglobin: 12.3 g/dL (ref 12.0–15.0)
Lymphocytes Relative: 29.8 % (ref 12.0–46.0)
Lymphs Abs: 2.6 10*3/uL (ref 0.7–4.0)
MCHC: 33 g/dL (ref 30.0–36.0)
MCV: 76.3 fl — ABNORMAL LOW (ref 78.0–100.0)
Monocytes Absolute: 0.6 10*3/uL (ref 0.1–1.0)
Monocytes Relative: 6.5 % (ref 3.0–12.0)
Neutro Abs: 5.4 10*3/uL (ref 1.4–7.7)
Neutrophils Relative %: 61.7 % (ref 43.0–77.0)
Platelets: 347 10*3/uL (ref 150.0–400.0)
RBC: 4.87 Mil/uL (ref 3.87–5.11)
RDW: 17.4 % — ABNORMAL HIGH (ref 11.5–15.5)
WBC: 8.8 10*3/uL (ref 4.0–10.5)

## 2016-06-01 LAB — COMPREHENSIVE METABOLIC PANEL
ALT: 8 U/L (ref 0–35)
AST: 11 U/L (ref 0–37)
Albumin: 4.2 g/dL (ref 3.5–5.2)
Alkaline Phosphatase: 52 U/L (ref 39–117)
BUN: 9 mg/dL (ref 6–23)
CO2: 24 mEq/L (ref 19–32)
Calcium: 9.8 mg/dL (ref 8.4–10.5)
Chloride: 103 mEq/L (ref 96–112)
Creatinine, Ser: 0.89 mg/dL (ref 0.40–1.20)
GFR: 90.11 mL/min (ref >60.00)
Glucose, Bld: 90 mg/dL (ref 70–99)
Potassium: 3.8 mEq/L (ref 3.5–5.1)
Sodium: 134 mEq/L — ABNORMAL LOW (ref 135–145)
Total Bilirubin: 0.2 mg/dL (ref 0.2–1.2)
Total Protein: 8.1 g/dL (ref 6.0–8.3)

## 2016-06-01 LAB — C-REACTIVE PROTEIN: CRP: 0.6 mg/dL (ref 0.5–20.0)

## 2016-06-01 LAB — CK: Total CK: 88 U/L (ref 7–177)

## 2016-06-01 LAB — MAGNESIUM: Magnesium: 1.9 mg/dL (ref 1.5–2.5)

## 2016-06-01 LAB — SEDIMENTATION RATE: Sed Rate: 13 mm/hr (ref 0–20)

## 2016-06-01 MED ORDER — SUVOREXANT 15 MG PO TABS: 1.0000 | ORAL_TABLET | Freq: Every evening | ORAL | Status: DC | PRN

## 2016-06-01 MED ORDER — PREGABALIN 50 MG PO CAPS: 50.0000 mg | ORAL_CAPSULE | Freq: Three times a day (TID) | ORAL | Status: DC

## 2016-06-01 NOTE — Patient Instructions (Signed)
Myofascial Pain Syndrome and Fibromyalgia  Myofascial pain syndrome and fibromyalgia are both pain disorders. This pain may be felt mainly in your muscles.   · Myofascial pain syndrome:    Always has trigger points or tender points in the muscle that will cause pain when pressed. The pain may come and go.    Usually affects your neck, upper back, and shoulder areas. The pain often radiates into your arms and hands.  · Fibromyalgia:    Has muscle pains and tenderness that come and go.    Is often associated with fatigue and sleep disturbances.    Has trigger points.    Tends to be long-lasting (chronic), but is not life-threatening.  Fibromyalgia and myofascial pain are not the same. However, they often occur together. If you have both conditions, each can make the other worse. Both are common and can cause enough pain and fatigue to make day-to-day activities difficult.   CAUSES   The exact causes of fibromyalgia and myofascial pain are not known. People with certain gene types may be more likely to develop fibromyalgia. Some factors can be triggers for both conditions, such as:   · Spine disorders.  · Arthritis.  · Severe injury (trauma) and other physical stressors.  · Being under a lot of stress.  · A medical illness.  SIGNS AND SYMPTOMS   Fibromyalgia  The main symptom of fibromyalgia is widespread pain and tenderness in your muscles. This can vary over time. Pain is sometimes described as stabbing, shooting, or burning. You may have tingling or numbness, too. You may also have sleep problems and fatigue. You may wake up feeling tired and groggy (fibro fog). Other symptoms may include:   · Bowel and bladder problems.  · Headaches.  · Visual problems.  · Problems with odors and noises.  · Depression or mood changes.  · Painful menstrual periods (dysmenorrhea).  · Dry skin or eyes.  Myofascial pain syndrome  Symptoms of myofascial pain syndrome include:   · Tight, ropy bands of muscle.    · Uncomfortable  sensations in muscular areas, such as:    Aching.    Cramping.    Burning.    Numbness.    Tingling.      Muscle weakness.  · Trouble moving certain muscles freely (range of motion).  DIAGNOSIS   There are no specific tests to diagnose fibromyalgia or myofascial pain syndrome. Both can be hard to diagnose because their symptoms are common in many other conditions. Your health care provider may suspect one or both of these conditions based on your symptoms and medical history. Your health care provider will also do a physical exam.   The key to diagnosing fibromyalgia is having pain, fatigue, and other symptoms for more than three months that cannot be explained by another condition.   The key to diagnosing myofascial pain syndrome is finding trigger points in muscles that are tender and cause pain elsewhere in your body (referred pain).  TREATMENT   Treating fibromyalgia and myofascial pain often requires a team of health care providers. This usually starts with your primary provider and a physical therapist. You may also find it helpful to work with alternative health care providers, such as massage therapists or acupuncturists.  Treatment for fibromyalgia may include medicines. This may include nonsteroidal anti-inflammatory drugs (NSAIDs), along with other medicines.   Treatment for myofascial pain may also include:  · NSAIDs.  · Cooling and stretching of muscles.  · Trigger point injections.  ·   Sound wave (ultrasound) treatments to stimulate muscles.  HOME CARE INSTRUCTIONS   · Take medicines only as directed by your health care provider.  · Exercise as directed by your health care provider or physical therapist.  · Try to avoid stressful situations.  · Practice relaxation techniques to control your stress. You may want to try:    Biofeedback.    Visual imagery.    Hypnosis.    Muscle relaxation.    Yoga.    Meditation.  · Talk to your health care provider about alternative treatments, such as acupuncture or  massage treatment.  · Maintain a healthy lifestyle. This includes eating a healthy diet and getting enough sleep.  · Consider joining a support group.  · Do not do activities that stress or strain your muscles. That includes repetitive motions and heavy lifting.  SEEK MEDICAL CARE IF:   · You have new symptoms.  · Your symptoms get worse.  · You have side effects from your medicines.  · You have trouble sleeping.  · Your condition is causing depression or anxiety.  FOR MORE INFORMATION   · National Fibromyalgia Association: http://www.fmaware.orgwww.fmaware.org  · Arthritis Foundation: http://www.arthritis.orgwww.arthritis.org  · American Chronic Pain Association: http://www.theacpa.org/condition/myofascial-painwww.theacpa.org/condition/myofascial-pain     This information is not intended to replace advice given to you by your health care provider. Make sure you discuss any questions you have with your health care provider.     Document Released: 12/06/2005 Document Revised: 12/27/2014 Document Reviewed: 09/11/2014  Elsevier Interactive Patient Education ©2016 [employer]

## 2016-06-01 NOTE — Progress Notes (Signed)
Pre visit review using our clinic review tool, if applicable. No additional management support is needed unless otherwise documented below in the visit note. 

## 2016-06-01 NOTE — Progress Notes (Signed)
Subjective:  Patient ID: Samantha Ball, female    DOB: Jun 12, 1976  Age: 40 y.o. MRN: [id number]  CC: Hypertension   HPI Samantha Ball presents for a blood pressure check. She tells me that her blood pressure has been well controlled with hydrochlorothiazide. She's had no episodes of headache or blurred vision. She does complain of fatigue but denies dyspnea on exertion, palpitations, edema, chest pain, shortness of breath, or syncope. For the last week or 2 she complains of worsening pain stating that "all my joints and muscles hurt." She has a history of fibromyalgia. None of her joints are red or swollen. She also complains of insomnia.  Outpatient Prescriptions Prior to Visit  Medication Sig Dispense Refill  . esomeprazole (NEXIUM) 40 MG capsule TAKE 1 CAPSULE (40 MG TOTAL) BY MOUTH DAILY. 30 capsule 9  . hydrochlorothiazide (MICROZIDE) 12.5 MG capsule Take 1 capsule (12.5 mg total) by mouth daily. 30 capsule 5  . naproxen (NAPROSYN) 375 MG tablet TAKE 1 TABLET BY MOUTH TWICE A DAY WITH A MEAL 60 tablet 1  . Diclofenac Sodium (PENNSAID) 2 % SOLN Place 1 application onto the skin 2 (two) times daily as needed. 112 g 1  . esomeprazole (NEXIUM) 40 MG capsule Take 1 capsule (40 mg total) by mouth daily. (Patient taking differently: Take 40 mg by mouth daily as needed. ) 90 capsule 3   No facility-administered medications prior to visit.    ROS Review of Systems  Constitutional: Positive for fatigue. Negative for chills, diaphoresis, appetite change and unexpected weight change.  HENT: Negative for congestion, facial swelling, sinus pressure, sore throat, trouble swallowing and voice change.   Eyes: Negative.  Negative for visual disturbance.  Respiratory: Negative.  Negative for cough, choking, chest tightness, shortness of breath and stridor.   Cardiovascular: Negative.  Negative for chest pain, palpitations and leg swelling.  Gastrointestinal: Positive for constipation. Negative for  nausea, vomiting, abdominal pain, diarrhea and blood in stool.  Endocrine: Negative.   Genitourinary: Negative.   Musculoskeletal: Positive for myalgias and arthralgias. Negative for back pain, joint swelling, gait problem, neck pain and neck stiffness.  Skin: Negative.  Negative for color change and rash.  Allergic/Immunologic: Negative.   Neurological: Negative.  Negative for dizziness, tremors, weakness, light-headedness, numbness and headaches.  Hematological: Negative.  Negative for adenopathy. Does not bruise/bleed easily.  Psychiatric/Behavioral: Positive for sleep disturbance (DFA and FA). Negative for dysphoric mood and decreased concentration. The patient is not nervous/anxious.     Objective:  BP 130/90 mmHg  Pulse 82  Temp(Src) 98.4 F (36.9 C) (Oral)  Resp 16  Ht 5\' 4"  (1.626 m)  Wt 207 lb (93.895 kg)  BMI 35.51 kg/m2  SpO2 99%  BP Readings from Last 3 Encounters:  06/01/16 130/90  02/19/16 160/100  02/04/16 152/100    Wt Readings from Last 3 Encounters:  06/01/16 207 lb (93.895 kg)  02/19/16 199 lb (90.266 kg)  02/04/16 207 lb (93.895 kg)    Physical Exam  Constitutional: She is oriented to person, place, and time. She appears well-developed and well-nourished. No distress.  HENT:  Head: Normocephalic and atraumatic.  Mouth/Throat: Oropharynx is clear and moist. No oropharyngeal exudate.  Eyes: Conjunctivae are normal. Right eye exhibits no discharge. Left eye exhibits no discharge. No scleral icterus.  Neck: Normal range of motion. Neck supple. No JVD present. No tracheal deviation present. No thyromegaly present.  Cardiovascular: Normal rate, regular rhythm, normal heart sounds and intact distal pulses.  Exam reveals  no gallop and no friction rub.   No murmur heard. Pulmonary/Chest: Effort normal and breath sounds normal. No stridor. No respiratory distress. She has no wheezes. She has no rales. She exhibits no tenderness.  Abdominal: Soft. Bowel sounds are  normal. She exhibits no distension and no mass. There is no tenderness. There is no rebound and no guarding.  Musculoskeletal: She exhibits no edema or tenderness.  Lymphadenopathy:    She has no cervical adenopathy.  Neurological: She is oriented to person, place, and time.  Skin: Skin is warm and dry. No rash noted. She is not diaphoretic. No erythema. No pallor.  Vitals reviewed.   Lab Results  Component Value Date   WBC 8.8 06/01/2016   HGB 12.3 06/01/2016   HCT 37.1 06/01/2016   PLT 347.0 06/01/2016   GLUCOSE 90 06/01/2016   CHOL 180 07/11/2013   TRIG 67.0 07/11/2013   HDL 51.80 07/11/2013   LDLCALC 115* 07/11/2013   ALT 8 06/01/2016   AST 11 06/01/2016   NA 134* 06/01/2016   K 3.8 06/01/2016   CL 103 06/01/2016   CREATININE 0.89 06/01/2016   BUN 9 06/01/2016   CO2 24 06/01/2016   TSH 1.77 06/01/2016    Dg Elbow Complete Right  02/04/2016  CLINICAL DATA:  Anterior right elbow pain for 1 month. Pain with extending arm. No known injury. EXAM: RIGHT ELBOW - COMPLETE 3+ VIEW COMPARISON:  None. FINDINGS: There is a right elbow joint effusion. No visible fracture. No subluxation or dislocation. Soft tissues are intact. IMPRESSION: Right elbow joint effusion. No visible fracture. If there is clinical concern for occult fracture, consider immobilization and repeat imaging. Electronically Signed   By: Rolm Baptise M.D.   On: 02/04/2016 12:23    Assessment & Plan:   Samantha Ball was seen today for hypertension.  Diagnoses and all orders for this visit:  Constipation, unspecified constipation type- Her med list does not include anything that would cause constipation, her labs are negative for any evidence of secondary or metabolic causes of constipation, she tells me the constipation is not bad enough for her to want me to treat it. She will let me know if it becomes severe enough for her to need a medication to treat it. -     Comprehensive metabolic panel; Future -     Thyroid Panel  With TSH; Future -     Magnesium; Future  Positive ANA (antinuclear antibody)- she has myalgias and arthritic arthralgias but no evidence of arthritis or synovitis today, her sedimentation rate and CRP are negative for any evidence of an inflammatory process -     Sedimentation rate; Future -     CK; Future -     C-reactive protein; Future  Other iron deficiency anemias- improvement noted -     CBC with Differential/Platelet; Future  Myalgia and myositis- I think she is having a recurrence of fibromyalgia that is exacerbated by insomnia, will treat the insomnia with Belsomra, I've also asked her to start taking Lyrica for symptom relief-will start at a low dose and increase as indicated. -     Comprehensive metabolic panel; Future -     Sedimentation rate; Future -     CK; Future -     C-reactive protein; Future -     Suvorexant (BELSOMRA) 15 MG TABS; Take 1 tablet by mouth at bedtime as needed. -     pregabalin (LYRICA) 50 MG capsule; Take 1 capsule (50 mg total) by mouth 3 (  three) times daily.  Insomnia -     Suvorexant (BELSOMRA) 15 MG TABS; Take 1 tablet by mouth at bedtime as needed.   I have discontinued Ms. Alper's Diclofenac Sodium. I am also having her start on Suvorexant and pregabalin. Additionally, I am having her maintain her hydrochlorothiazide, esomeprazole, naproxen, and levonorgestrel-ethinyl estradiol.  Meds ordered this encounter  Medications  . levonorgestrel-ethinyl estradiol (NORDETTE) 0.15-30 MG-MCG tablet    Sig: Take 1 tablet by mouth daily.    Dispense:  1 Package    Refill:  11  . Suvorexant (BELSOMRA) 15 MG TABS    Sig: Take 1 tablet by mouth at bedtime as needed.    Dispense:  30 tablet    Refill:  2  . pregabalin (LYRICA) 50 MG capsule    Sig: Take 1 capsule (50 mg total) by mouth 3 (three) times daily.    Dispense:  90 capsule    Refill:  2     Follow-up: Return in about 3 weeks (around 06/22/2016).  Scarlette Calico, MD

## 2016-06-02 ENCOUNTER — Encounter: Payer: Self-pay | Admitting: Internal Medicine

## 2016-06-02 LAB — THYROID PANEL WITH TSH
Free Thyroxine Index: 3.2 (ref 1.4–3.8)
T3 Uptake: 19 % — ABNORMAL LOW (ref 22–35)
T4, Total: 16.8 ug/dL — ABNORMAL HIGH (ref 4.5–12.0)
TSH: 1.77 mIU/L

## 2016-06-14 LAB — HM MAMMOGRAPHY: HM Mammogram: NORMAL (ref 0–4)

## 2016-06-15 ENCOUNTER — Telehealth: Payer: Self-pay

## 2016-06-15 NOTE — Telephone Encounter (Signed)
PA initiated and APPROVED via CoverMyMeds 06/14/2017 - 06/14/2017 key [id number]

## 2016-06-15 NOTE — Addendum Note (Signed)
Addended by: [name] on: 06/15/2016 01:22 PM   Modules accepted: Miquel Dunn

## 2016-06-21 ENCOUNTER — Other Ambulatory Visit: Payer: Self-pay | Admitting: Obstetrics and Gynecology

## 2016-06-21 DIAGNOSIS — R928 Other abnormal and inconclusive findings on diagnostic imaging of breast: Secondary | ICD-10-CM

## 2016-06-25 ENCOUNTER — Ambulatory Visit
Admission: RE | Admit: 2016-06-25 | Discharge: 2016-06-25 | Disposition: A | Discharge status: Home / Self Care | Payer: 59 | Source: Ambulatory Visit | Attending: Obstetrics and Gynecology | Admitting: Obstetrics and Gynecology

## 2016-06-25 DIAGNOSIS — R928 Other abnormal and inconclusive findings on diagnostic imaging of breast: Secondary | ICD-10-CM

## 2016-07-17 ENCOUNTER — Other Ambulatory Visit: Payer: Self-pay | Admitting: Internal Medicine

## 2016-07-23 ENCOUNTER — Telehealth: Payer: Self-pay | Admitting: *Deleted

## 2016-07-23 ENCOUNTER — Other Ambulatory Visit: Payer: Self-pay | Admitting: Internal Medicine

## 2016-07-23 DIAGNOSIS — I10 Essential (primary) hypertension: Secondary | ICD-10-CM

## 2016-07-23 DIAGNOSIS — IMO0001 Reserved for inherently not codable concepts without codable children: Secondary | ICD-10-CM

## 2016-07-23 MED ORDER — HYDROCHLOROTHIAZIDE 12.5 MG PO CAPS: 12.5000 mg | ORAL_CAPSULE | Freq: Every day | ORAL | 1 refills | Status: DC

## 2016-07-23 MED ORDER — LEVONORGESTREL-ETHINYL ESTRAD 0.15-30 MG-MCG PO TABS: 1.0000 | ORAL_TABLET | Freq: Every day | ORAL | 3 refills | Status: DC

## 2016-07-23 MED ORDER — NAPROXEN 375 MG PO TABS: ORAL_TABLET | ORAL | 1 refills | Status: DC

## 2016-07-23 MED ORDER — PREGABALIN 50 MG PO CAPS: 50.0000 mg | ORAL_CAPSULE | Freq: Three times a day (TID) | ORAL | 2 refills | Status: DC

## 2016-07-23 NOTE — Telephone Encounter (Signed)
Script faxed to [facility]...Johny Chess

## 2016-07-23 NOTE — Telephone Encounter (Signed)
rxs written.

## 2016-07-23 NOTE — Telephone Encounter (Signed)
Rec,d fax pt requesting refills for 90 day on her Lyrica, Naproxen, and Loryna...Johny Chess

## 2016-07-26 ENCOUNTER — Other Ambulatory Visit: Payer: Self-pay | Admitting: Internal Medicine

## 2016-07-26 DIAGNOSIS — K2100 Gastro-esophageal reflux disease with esophagitis, without bleeding: Secondary | ICD-10-CM

## 2016-07-26 MED ORDER — ESOMEPRAZOLE MAGNESIUM 40 MG PO CPDR: 40.0000 mg | DELAYED_RELEASE_CAPSULE | Freq: Every day | ORAL | 3 refills | Status: DC

## 2016-07-28 MED ORDER — PREGABALIN 50 MG PO CAPS: 50.0000 mg | ORAL_CAPSULE | Freq: Three times a day (TID) | ORAL | 2 refills | Status: DC

## 2016-07-28 NOTE — Progress Notes (Unsigned)
Received fax from [facility]. rx for lyrica that was faxed did not have a signature. Reprinted for md to sign.

## 2016-11-09 ENCOUNTER — Ambulatory Visit (INDEPENDENT_AMBULATORY_CARE_PROVIDER_SITE_OTHER): Payer: 59 | Admitting: Internal Medicine

## 2016-11-09 ENCOUNTER — Encounter: Payer: Self-pay | Admitting: Internal Medicine

## 2016-11-09 VITALS — BP 160/100 | HR 87 | Temp 98.3°F | Resp 16 | Ht 64.0 in | Wt 209.8 lb

## 2016-11-09 DIAGNOSIS — G4701 Insomnia due to medical condition: Secondary | ICD-10-CM

## 2016-11-09 DIAGNOSIS — R0683 Snoring: Secondary | ICD-10-CM | POA: Diagnosis not present

## 2016-11-09 DIAGNOSIS — I10 Essential (primary) hypertension: Secondary | ICD-10-CM

## 2016-11-09 DIAGNOSIS — Z23 Encounter for immunization: Secondary | ICD-10-CM | POA: Diagnosis not present

## 2016-11-09 MED ORDER — NEBIVOLOL HCL 10 MG PO TABS: 10.0000 mg | ORAL_TABLET | Freq: Every day | ORAL | 0 refills | Status: DC

## 2016-11-09 MED ORDER — ESZOPICLONE 2 MG PO TABS: 2.0000 mg | ORAL_TABLET | Freq: Every evening | ORAL | 1 refills | Status: DC | PRN

## 2016-11-09 NOTE — Progress Notes (Signed)
Subjective:  Patient ID: Samantha Ball, female    DOB: [birth year]  Age: 40 y.o. MRN: [id number]  CC: Hypertension   HPI Samantha Ball presents for a BP check. She tells me her blood pressure has not been well controlled with HCTZ but she denies any recent episodes of headache/blurred vision/chest pain/shortness of breath/palpitations/or edema. She does complains of chronic fatigue with insomnia. She took [place] but said it made her too sleepy and made her groggy the next day. She also complains of heavy snoring and thinks she may have sleep apnea.  Outpatient Medications Prior to Visit  Medication Sig Dispense Refill  . esomeprazole (NEXIUM) 40 MG capsule Take 1 capsule (40 mg total) by mouth daily at 12 noon. 90 capsule 3  . hydrochlorothiazide (MICROZIDE) 12.5 MG capsule Take 1 capsule (12.5 mg total) by mouth daily. 90 capsule 1  . naproxen (NAPROSYN) 375 MG tablet TAKE 1 TABLET BY MOUTH TWICE A DAY WITH A MEAL 180 tablet 1  . levonorgestrel-ethinyl estradiol (NORDETTE) 0.15-30 MG-MCG tablet Take 1 tablet by mouth daily. 3 Package 3  . pregabalin (LYRICA) 50 MG capsule Take 1 capsule (50 mg total) by mouth 3 (three) times daily. 90 capsule 2  . Suvorexant (BELSOMRA) 15 MG TABS Take 1 tablet by mouth at bedtime as needed. 30 tablet 2   No facility-administered medications prior to visit.     ROS Review of Systems  Constitutional: Positive for fatigue. Negative for activity change, appetite change, diaphoresis and unexpected weight change.  HENT: Negative.   Eyes: Negative for visual disturbance.  Respiratory: Positive for apnea. Negative for cough, shortness of breath and wheezing.   Cardiovascular: Negative.  Negative for chest pain, palpitations and leg swelling.  Gastrointestinal: Negative for abdominal pain, constipation, diarrhea, nausea and vomiting.  Endocrine: Negative.   Genitourinary: Negative.  Negative for decreased urine volume, difficulty urinating, dysuria and  hematuria.  Musculoskeletal: Negative.  Negative for back pain, myalgias and neck pain.  Skin: Negative.  Negative for color change and rash.  Allergic/Immunologic: Negative.   Neurological: Negative.  Negative for dizziness, weakness, numbness and headaches.  Hematological: Negative.  Negative for adenopathy. Does not bruise/bleed easily.  Psychiatric/Behavioral: Positive for sleep disturbance. Negative for behavioral problems, confusion, decreased concentration and dysphoric mood. The patient is not nervous/anxious.     Objective:  BP (!) 160/100 (BP Location: Left Arm, Patient Position: Sitting, Cuff Size: Large)   Pulse 87   Temp 98.3 F (36.8 C) (Oral)   Resp 16   Ht 5\' 4"  (1.626 m)   Wt 209 lb 12 oz (95.1 kg)   LMP 10/26/2016   SpO2 99%   BMI 36.00 kg/m   BP Readings from Last 3 Encounters:  11/09/16 (!) 160/100  06/01/16 130/90  02/19/16 (!) 160/100    Wt Readings from Last 3 Encounters:  11/09/16 209 lb 12 oz (95.1 kg)  06/01/16 207 lb (93.9 kg)  02/19/16 199 lb (90.3 kg)    Physical Exam  Constitutional: She is oriented to person, place, and time. She appears well-developed and well-nourished. No distress.  HENT:  Mouth/Throat: Oropharynx is clear and moist. No oropharyngeal exudate.  Eyes: Conjunctivae are normal. Right eye exhibits no discharge. Left eye exhibits no discharge. No scleral icterus.  Neck: Normal range of motion. Neck supple. No JVD present. No tracheal deviation present. No thyromegaly present.  Cardiovascular: Normal rate, regular rhythm, normal heart sounds and intact distal pulses.  Exam reveals no gallop and no friction rub.  No murmur heard. Pulmonary/Chest: Effort normal and breath sounds normal. No stridor. No respiratory distress. She has no wheezes. She has no rales. She exhibits no tenderness.  Abdominal: Soft. Bowel sounds are normal. She exhibits no distension and no mass. There is no tenderness. There is no rebound and no guarding.    Musculoskeletal: Normal range of motion. She exhibits no edema, tenderness or deformity.  Lymphadenopathy:    She has no cervical adenopathy.  Neurological: She is oriented to person, place, and time.  Skin: Skin is warm and dry. No rash noted. She is not diaphoretic. No erythema. No pallor.  Psychiatric: She has a normal mood and affect. Her behavior is normal. Judgment and thought content normal.  Vitals reviewed.   Lab Results  Component Value Date   WBC 8.8 06/01/2016   HGB 12.3 06/01/2016   HCT 37.1 06/01/2016   PLT 347.0 06/01/2016   GLUCOSE 90 06/01/2016   CHOL 180 07/11/2013   TRIG 67.0 07/11/2013   HDL 51.80 07/11/2013   LDLCALC 115 (H) 07/11/2013   ALT 8 06/01/2016   AST 11 06/01/2016   NA 134 (L) 06/01/2016   K 3.8 06/01/2016   CL 103 06/01/2016   CREATININE 0.89 06/01/2016   BUN 9 06/01/2016   CO2 24 06/01/2016   TSH 1.77 06/01/2016    Mm Diag Breast Tomo Uni Left  Result Date: 06/25/2016 CLINICAL DATA:  Screening recall for a possible asymmetry in the left breast. Screening exam was the patient's baseline study. EXAM: 2D DIGITAL DIAGNOSTIC UNILATERAL LEFT MAMMOGRAM WITH CAD AND ADJUNCT TOMO COMPARISON:  06/14/2016 ACR Breast Density Category b: There are scattered areas of fibroglandular density. FINDINGS: Area of possible asymmetry disperses on the 2D and 3D diagnostic images consistent with normal fibroglandular tissue. There is no discrete mass or distortion. There are no calcifications. Mammographic images were processed with CAD. IMPRESSION: Normal exam.  No evidence of malignancy. RECOMMENDATION: Screening mammogram in one year.(Code:[id number]-B-01Y) I have discussed the findings and recommendations with the patient. Results were also provided in writing at the conclusion of the visit. If applicable, a reminder letter will be sent to the patient regarding the next appointment. BI-RADS CATEGORY  1: Negative. Electronically Signed   By: Lajean Manes M.D.   On: 06/25/2016  15:16    Assessment & Plan:   Samantha Ball was seen today for hypertension.  Diagnoses and all orders for this visit:  Essential hypertension, benign- I've asked her to add nebivolol to the thiazide diuretic for better blood pressure control. -     nebivolol (BYSTOLIC) 10 MG tablet; Take 1 tablet (10 mg total) by mouth daily.  Insomnia due to medical condition- will try Lunesta for the insomnia. -     eszopiclone (LUNESTA) 2 MG TABS tablet; Take 1 tablet (2 mg total) by mouth at bedtime as needed for sleep. Take immediately before bedtime  Snoring- I'm concerned she has sleep apnea so I have referred her for a sleep evaluation. -     Ambulatory referral to Sleep Studies  Need for prophylactic vaccination and inoculation against influenza -     Flu Vaccine QUAD 36+ mos IM   I have discontinued Ms. Knock's Suvorexant, levonorgestrel-ethinyl estradiol, and pregabalin. I am also having her start on nebivolol and eszopiclone. Additionally, I am having her maintain her hydrochlorothiazide, naproxen, and esomeprazole.  Meds ordered this encounter  Medications  . nebivolol (BYSTOLIC) 10 MG tablet    Sig: Take 1 tablet (10 mg total) by mouth daily.  Dispense:  63 tablet    Refill:  0  . eszopiclone (LUNESTA) 2 MG TABS tablet    Sig: Take 1 tablet (2 mg total) by mouth at bedtime as needed for sleep. Take immediately before bedtime    Dispense:  90 tablet    Refill:  1     Follow-up: Return in about 6 weeks (around 12/21/2016).  Scarlette Calico, MD

## 2016-11-09 NOTE — Progress Notes (Signed)
Pre visit review using our clinic review tool, if applicable. No additional management support is needed unless otherwise documented below in the visit note. 

## 2016-11-09 NOTE — Patient Instructions (Signed)
Hypertension Hypertension, commonly called high blood pressure, is when the force of blood pumping through your arteries is too strong. Your arteries are the blood vessels that carry blood from your heart throughout your body. A blood pressure reading consists of a higher number over a lower number, such as 110/72. The higher number (systolic) is the pressure inside your arteries when your heart pumps. The lower number (diastolic) is the pressure inside your arteries when your heart relaxes. Ideally you want your blood pressure below 120/80. Hypertension forces your heart to work harder to pump blood. Your arteries may become narrow or stiff. Having untreated or uncontrolled hypertension can cause heart attack, stroke, kidney disease, and other problems. What increases the risk? Some risk factors for high blood pressure are controllable. Others are not. Risk factors you cannot control include:  Race. You may be at higher risk if you are African American.  Age. Risk increases with age.  Gender. Men are at higher risk than women before age 45 years. After age 65, women are at higher risk than men. Risk factors you can control include:  Not getting enough exercise or physical activity.  Being overweight.  Getting too much fat, sugar, calories, or salt in your diet.  Drinking too much alcohol. What are the signs or symptoms? Hypertension does not usually cause signs or symptoms. Extremely high blood pressure (hypertensive crisis) may cause headache, anxiety, shortness of breath, and nosebleed. How is this diagnosed? To check if you have hypertension, your health care provider will measure your blood pressure while you are seated, with your arm held at the level of your heart. It should be measured at least twice using the same arm. Certain conditions can cause a difference in blood pressure between your right and left arms. A blood pressure reading that is higher than normal on one occasion does  not mean that you need treatment. If it is not clear whether you have high blood pressure, you may be asked to return on a different day to have your blood pressure checked again. Or, you may be asked to monitor your blood pressure at home for 1 or more weeks. How is this treated? Treating high blood pressure includes making lifestyle changes and possibly taking medicine. Living a healthy lifestyle can help lower high blood pressure. You may need to change some of your habits. Lifestyle changes may include:  Following the DASH diet. This diet is high in fruits, vegetables, and whole grains. It is low in salt, red meat, and added sugars.  Keep your sodium intake below 2,300 mg per day.  Getting at least 30-45 minutes of aerobic exercise at least 4 times per week.  Losing weight if necessary.  Not smoking.  Limiting alcoholic beverages.  Learning ways to reduce stress. Your health care provider may prescribe medicine if lifestyle changes are not enough to get your blood pressure under control, and if one of the following is true:  You are 18-59 years of age and your systolic blood pressure is above 140.  You are 60 years of age or older, and your systolic blood pressure is above 150.  Your diastolic blood pressure is above 90.  You have diabetes, and your systolic blood pressure is over 140 or your diastolic blood pressure is over 90.  You have kidney disease and your blood pressure is above 140/90.  You have heart disease and your blood pressure is above 140/90. Your personal target blood pressure may vary depending on your medical   conditions, your age, and other factors. Follow these instructions at home:  Have your blood pressure rechecked as directed by your health care provider.  Take medicines only as directed by your health care provider. Follow the directions carefully. Blood pressure medicines must be taken as prescribed. The medicine does not work as well when you skip  doses. Skipping doses also puts you at risk for problems.  Do not smoke.  Monitor your blood pressure at home as directed by your health care provider. Contact a health care provider if:  You think you are having a reaction to medicines taken.  You have recurrent headaches or feel dizzy.  You have swelling in your ankles.  You have trouble with your vision. Get help right away if:  You develop a severe headache or confusion.  You have unusual weakness, numbness, or feel faint.  You have severe chest or abdominal pain.  You vomit repeatedly.  You have trouble breathing. This information is not intended to replace advice given to you by your health care provider. Make sure you discuss any questions you have with your health care provider. Document Released: 12/06/2005 Document Revised: 05/13/2016 Document Reviewed: 09/28/2013 Elsevier Interactive Patient Education  2017 [employer]vier Inc[employer]  

## 2016-12-07 ENCOUNTER — Encounter: Payer: Self-pay | Admitting: Neurology

## 2016-12-07 ENCOUNTER — Ambulatory Visit (INDEPENDENT_AMBULATORY_CARE_PROVIDER_SITE_OTHER): Payer: 59 | Admitting: Neurology

## 2016-12-07 VITALS — BP 126/82 | HR 65 | Resp 20 | Ht 63.0 in | Wt 213.0 lb

## 2016-12-07 DIAGNOSIS — R400 Somnolence: Secondary | ICD-10-CM | POA: Diagnosis not present

## 2016-12-07 DIAGNOSIS — R351 Nocturia: Secondary | ICD-10-CM

## 2016-12-07 DIAGNOSIS — G4761 Periodic limb movement disorder: Secondary | ICD-10-CM

## 2016-12-07 DIAGNOSIS — R51 Headache: Secondary | ICD-10-CM

## 2016-12-07 DIAGNOSIS — E669 Obesity, unspecified: Secondary | ICD-10-CM

## 2016-12-07 DIAGNOSIS — G2581 Restless legs syndrome: Secondary | ICD-10-CM

## 2016-12-07 DIAGNOSIS — R519 Headache, unspecified: Secondary | ICD-10-CM

## 2016-12-07 DIAGNOSIS — R0683 Snoring: Secondary | ICD-10-CM | POA: Diagnosis not present

## 2016-12-07 NOTE — Progress Notes (Signed)
Subjective:    Patient ID: Samantha Ball is a 40 y.o. female.  HPI     Star Age, MD, PhD [facility] [address], [address] [place], [place] [address]  Dear Dr. Ronnald Ramp,   I saw your patient, Loriann Wessler, upon your kind request in my neurologic clinic today for initial consultation of her sleep disorder, in particular, concern for underlying obstructive sleep apnea. The patient is unaccompanied today. As you know, Ms. Szarka is a 40 year old right-handed woman with an underlying medical history of hypertension, iron deficiency anemia, hearing loss R ear, joint pain, and obesity, who reports difficulty with her sleep including sleep maintenance issues, snoring, morning HAs and daytime somnolence. I reviewed your office note from 11/09/2016. She recently tried Belsomra for about a week, but felt too groggy the next day, was given a Rx for lunesta but did not fill it for fear of similar SEs.  She snores, which per family can be loud, has had morning headaches, has nocturia 1-2 per night. She is not aware of any FHx of OSA.  Her Epworth sleepiness score is 17 out of 24 today, her fatigue score is 47 out of 63. She lives at home with her husband and 2 children. She is a nonsmoker, does not drink alcohol, does not use illicit drugs, she drinks caffeine in the form of soda, typically [facility], 1 can per day and one glass of tea per day. Weight has increased in the last 6 months. She has had EDS for a few years and snoring, but worse in the last 6 months. She has occasional RLS symptoms and moves her legs at night.  She works an Marketing executive job, daytime hours, bedtime is between 10:30 and 11 and wake up time at 5:30.  Her Past Medical History Is Significant For: Past Medical History:  Diagnosis Date  . Hyperlipidemia   . Hypertension     Her Past Surgical History Is Significant For: Past Surgical History:  Procedure Laterality Date  . TUBAL LIGATION    .  wisdom teeth  2000    Her Family History Is Significant For: Family History  Problem Relation Age of Onset  . Heart disease Father   . Cancer Neg Hx   . Alcohol abuse Neg Hx   . COPD Neg Hx   . Diabetes Neg Hx   . Depression Neg Hx   . Drug abuse Neg Hx   . Early death Neg Hx   . Hearing loss Neg Hx   . Hyperlipidemia Neg Hx   . Hypertension Neg Hx   . Kidney disease Neg Hx   . Stroke Neg Hx     Her Social History Is Significant For: Social History   Social History  . Marital status: Married    Spouse name: N/A  . Number of children: N/A  . Years of education: N/A   Social History Main Topics  . Smoking status: Never Smoker  . Smokeless tobacco: Never Used  . Alcohol use No  . Drug use: No  . Sexual activity: Yes    Birth control/ protection: Surgical     Comment: BTL   Other Topics Concern  . None   Social History Narrative  . None    Her Allergies Are:  Allergies  Allergen Reactions  . Amlodipine     constipation  :   Her Current Medications Are:  Outpatient Encounter Prescriptions as of 12/07/2016  Medication Sig  . esomeprazole (  NEXIUM) 40 MG capsule Take 1 capsule (40 mg total) by mouth daily at 12 noon.  . hydrochlorothiazide (MICROZIDE) 12.5 MG capsule Take 1 capsule (12.5 mg total) by mouth daily.  . naproxen (NAPROSYN) 375 MG tablet TAKE 1 TABLET BY MOUTH TWICE A DAY WITH A MEAL  . nebivolol (BYSTOLIC) 10 MG tablet Take 1 tablet (10 mg total) by mouth daily.  . [DISCONTINUED] eszopiclone (LUNESTA) 2 MG TABS tablet Take 1 tablet (2 mg total) by mouth at bedtime as needed for sleep. Take immediately before bedtime   No facility-administered encounter medications on file as of 12/07/2016.   :  Review of Systems:  Out of a complete 14 point review of systems, all are reviewed and negative with the exception of these symptoms as listed below: Review of Systems  Constitutional: Positive for unexpected weight change.  HENT: Positive for hearing  loss and tinnitus.   Gastrointestinal: Positive for constipation.  Musculoskeletal: Positive for myalgias.  Neurological: Positive for weakness and headaches.       Pt presents today to discuss her sleep. Pt endorses snoring but has never had a sleep study.  Epworth Sleepiness Scale 0= would never doze 1= slight chance of dozing 2= moderate chance of dozing 3= high chance of dozing  Sitting and reading: 3 Watching TV: 3 Sitting inactive in a public place (ex. Theater or meeting): 1 As a passenger in a car for an hour without a break: 3 Lying down to rest in the afternoon: 3 Sitting and talking to someone: 1 Sitting quietly after lunch (no alcohol): 2 In a car, while stopped in traffic: 0 Total:17   Psychiatric/Behavioral: Positive for sleep disturbance.    Objective:  Neurologic Exam  Physical Exam Physical Examination:   Vitals:   12/07/16 1339  BP: 126/82  Pulse: 65  Resp: 20    General Examination: The patient is a very pleasant 40 y.o. female in no acute distress. She appears well-developed and well-nourished and well groomed.   HEENT: Normocephalic, atraumatic, pupils are equal, round and reactive to light and accommodation. Extraocular tracking is good without limitation to gaze excursion or nystagmus noted. Normal smooth pursuit is noted. Hearing is grossly intact. Hearing aid on R. Face is symmetric with normal facial animation and normal facial sensation. Speech is clear with no dysarthria noted. There is no hypophonia. There is no lip, neck/head, jaw or voice tremor. Neck is supple with full range of passive and active motion. There are no carotid bruits on auscultation. Oropharynx exam reveals: mild mouth dryness, adequate dental hygiene and moderate airway crowding, due to smaller airway entry and redundant soft palate, thicker uvula, which ends in a wisp. Mallampati is class II. Tongue protrudes centrally and palate elevates symmetrically. Tonsils are 1+ in size.  Neck size is 15.75 inches. She has a Mild to moderate overbite. Nasal inspection reveals no significant nasal mucosal bogginess or redness and no septal deviation, but appears to have larger inf turbinates.   Chest: Clear to auscultation without wheezing, rhonchi or crackles noted.  Heart: S1+S2+0, regular and normal without murmurs, rubs or gallops noted.   Abdomen: Soft, non-tender and non-distended with normal bowel sounds appreciated on auscultation.  Extremities: There is no pitting edema in the distal lower extremities bilaterally. Pedal pulses are intact.  Skin: Warm and dry without trophic changes noted.  Musculoskeletal: exam reveals no obvious joint deformities, tenderness or joint swelling or erythema.   Neurologically:  Mental status: The patient is awake, alert and oriented  in all 4 spheres. Her immediate and remote memory, attention, language skills and fund of knowledge are appropriate. There is no evidence of aphasia, agnosia, apraxia or anomia. Speech is clear with normal prosody and enunciation. Thought process is linear. Mood is normal and affect is normal.  Cranial nerves II - XII are as described above under HEENT exam. In addition: shoulder shrug is normal with equal shoulder height noted. Motor exam: Normal bulk, strength and tone is noted. There is no drift, tremor or rebound. Romberg is negative. Reflexes are 2+ throughout. Fine motor skills and coordination: intact with normal finger taps, normal hand movements, normal rapid alternating patting, normal foot taps and normal foot agility.  Cerebellar testing: No dysmetria or intention tremor on finger to nose testing. Heel to shin is unremarkable bilaterally. There is no truncal or gait ataxia.  Sensory exam: intact to light touch, pinprick, vibration, temperature sense in the upper and lower extremities.  Gait, station and balance: She stands easily. No veering to one side is noted. No leaning to one side is noted.  Posture is age-appropriate and stance is narrow based. Gait shows normal stride length and normal pace. No problems turning are noted. Tandem walk is unremarkable.    Assessment and Plan:  In summary, MARNEISHA MILLIEN is a very pleasant 40 y.o.-year old female with an underlying medical history of hypertension, iron deficiency anemia, hearing loss R ear, joint pain, and obesity, whose history and physical exam are concerning for obstructive sleep apnea (OSA).Given her history of sleep maintenance issues, recurrent morning headaches, restless leg syndrome and leg movements at night, as well as nocturia and significant daytime somnolence, an in lab sleep study is justified.  I had a long chat with the patient about my findings and the diagnosis of OSA, its prognosis and treatment options. We talked about medical treatments, surgical interventions and non-pharmacological approaches. I explained in particular the risks and ramifications of untreated moderate to severe OSA, especially with respect to developing cardiovascular disease down the Road, including congestive heart failure, difficult to treat hypertension, cardiac arrhythmias, or stroke. Even type 2 diabetes has, in part, been linked to untreated OSA. Symptoms of untreated OSA include daytime sleepiness, memory problems, mood irritability and mood disorder such as depression and anxiety, lack of energy, as well as recurrent headaches, especially morning headaches. We talked about trying to maintain a healthy lifestyle in general, as well as the importance of weight control. I encouraged the patient to eat healthy, exercise daily and keep well hydrated, to keep a scheduled bedtime and wake time routine, to not skip any meals and eat healthy snacks in between meals. I advised the patient not to drive when feeling sleepy. I recommended the following at this time: sleep study with potential positive airway pressure titration. (We will score hypopneas at 4% and  split the sleep study into diagnostic and treatment portion, if the estimated. 2 hour AHI is >20/h).   I explained the sleep test procedure to the patient and also outlined possible surgical and non-surgical treatment options of OSA, including the use of a custom-made dental device (which would require a referral to a specialist dentist or oral surgeon), upper airway surgical options, such as pillar implants, radiofrequency surgery, tongue base surgery, and UPPP (which would involve a referral to an ENT surgeon). Rarely, jaw surgery such as mandibular advancement may be considered.  I also explained the CPAP treatment option to the patient, who indicated that she would be willing to try  CPAP if the need arises. I explained the importance of being compliant with PAP treatment, not only for insurance purposes but primarily to improve Her symptoms, and for the patient's long term health benefit, including to reduce Her cardiovascular risks. I answered all her questions today and the patient was in agreement. I would like to see her back after the sleep study is completed and encouraged her to call with any interim questions, concerns, problems or updates.   Thank you very much for allowing me to participate in the care of this nice patient. If I can be of any further assistance to you please do not hesitate to call me at [phone].  Sincerely,   Star Age, MD, PhD

## 2016-12-07 NOTE — Patient Instructions (Signed)

## 2016-12-14 ENCOUNTER — Ambulatory Visit (INDEPENDENT_AMBULATORY_CARE_PROVIDER_SITE_OTHER): Payer: 59 | Admitting: Emergency Medicine

## 2016-12-14 VITALS — BP 124/84 | HR 103 | Temp 99.2°F | Resp 18 | Ht 63.0 in | Wt 204.0 lb

## 2016-12-14 DIAGNOSIS — J029 Acute pharyngitis, unspecified: Secondary | ICD-10-CM | POA: Diagnosis not present

## 2016-12-14 MED ORDER — AMOXICILLIN-POT CLAVULANATE 875-125 MG PO TABS: 1.0000 | ORAL_TABLET | Freq: Two times a day (BID) | ORAL | 0 refills | Status: DC

## 2016-12-14 NOTE — Patient Instructions (Addendum)
     IF you received an x-ray today, you will receive an invoice from [facility]. Please contact [facility] at [phone] with questions or concerns regarding your invoice.   IF you received labwork today, you will receive an invoice from [facility]. Please contact [facility] at [phone] with questions or concerns regarding your invoice.   Our billing staff will not be able to assist you with questions regarding bills from these companies.  You will be contacted with the lab results as soon as they are available. The fastest way to get your results is to activate your My Chart account. Instructions are located on the last page of this paperwork. If you have not heard from us regarding the results in 2 weeks, please contact this office.     Pharyngitis Pharyngitis is a sore throat (pharynx). There is redness, pain, and swelling of your throat. Follow these instructions at home:  Drink enough fluids to keep your pee (urine) clear or pale yellow.  Only take medicine as told by your doctor.  You may get sick again if you do not take medicine as told. Finish your medicines, even if you start to feel better.  Do not take aspirin.  Rest.  Rinse your mouth (gargle) with salt water ( tsp of salt per 1 qt of water) every 1-2 hours. This will help the pain.  If you are not at risk for choking, you can suck on hard candy or sore throat lozenges. Contact a doctor if:  You have large, tender lumps on your neck.  You have a rash.  You cough up green, yellow-brown, or bloody spit. Get help right away if:  You have a stiff neck.  You drool or cannot swallow liquids.  You throw up (vomit) or are not able to keep medicine or liquids down.  You have very bad pain that does not go away with medicine.  You have problems breathing (not from a stuffy nose). This information is not intended to replace advice given to you by your health care provider. Make sure you  discuss any questions you have with your health care provider. Document Released: 05/24/2008 Document Revised: 05/13/2016 Document Reviewed: 08/13/2013 Elsevier Interactive Patient Education  2017 Elsevier Inc[employer]  

## 2016-12-14 NOTE — Progress Notes (Signed)
Samantha Ball 40 y.o.   Chief Complaint  Patient presents with  . Sore Throat    Began Thursday   . Fever  . Fatigue  . Chills  . Headache    HISTORY OF PRESENT ILLNESS: This is a 40 y.o. female complaining of feeling sick since last Thursday.  Sore Throat   This is a new problem. The current episode started in the past 7 days (since last Thursday). The problem has been gradually worsening. The pain is worse on the right side. The maximum temperature recorded prior to her arrival was 101 - 101.9 F. The fever has been present for 1 to 2 days. The pain is at a severity of 3/10. The pain is moderate. Associated symptoms include congestion, coughing, headaches and swollen glands. Pertinent negatives include no abdominal pain, diarrhea, drooling, ear discharge, ear pain, neck pain, shortness of breath, stridor, trouble swallowing or vomiting. She has tried acetaminophen and cool liquids for the symptoms. The treatment provided mild relief.     Prior to Admission medications   Medication Sig Start Date End Date Taking? Authorizing Provider  esomeprazole (NEXIUM) 40 MG capsule Take 1 capsule (40 mg total) by mouth daily at 12 noon. 07/26/16  Yes [name], MD  hydrochlorothiazide (MICROZIDE) 12.5 MG capsule Take 1 capsule (12.5 mg total) by mouth daily. 07/23/16  Yes [name], MD  naproxen (NAPROSYN) 375 MG tablet TAKE 1 TABLET BY MOUTH TWICE A DAY WITH A MEAL 07/23/16  Yes [name], MD  nebivolol (BYSTOLIC) 10 MG tablet Take 1 tablet (10 mg total) by mouth daily. 11/09/16  Yes [name], MD    Allergies  Allergen Reactions  . Amlodipine     constipation    Patient Active Problem List   Diagnosis Date Noted  . Snoring 11/09/2016  . Other iron deficiency anemias 12/17/2014  . Insomnia 01/09/2014  . GERD (gastroesophageal reflux disease) 01/09/2014  . Constipation 01/09/2014  . Hypokalemia 01/09/2014  . Positive ANA (antinuclear antibody) 07/16/2013  . Essential  hypertension, benign 07/11/2013  . Routine general medical examination at a health care facility 07/11/2013  . Myalgia and myositis 07/11/2013  . Obesity (BMI 30.0-34.9) 07/11/2013    Past Medical History:  Diagnosis Date  . Hyperlipidemia   . Hypertension     Past Surgical History:  Procedure Laterality Date  . TUBAL LIGATION    . wisdom teeth  2000    Social History   Social History  . Marital status: Married    Spouse name: N/A  . Number of children: N/A  . Years of education: N/A   Occupational History  . Not on file.   Social History Main Topics  . Smoking status: Never Smoker  . Smokeless tobacco: Never Used  . Alcohol use No  . Drug use: No  . Sexual activity: Yes    Birth control/ protection: Surgical     Comment: BTL   Other Topics Concern  . Not on file   Social History Narrative  . No narrative on file    Family History  Problem Relation Age of Onset  . Heart disease Father   . Cancer Neg Hx   . Alcohol abuse Neg Hx   . COPD Neg Hx   . Diabetes Neg Hx   . Depression Neg Hx   . Drug abuse Neg Hx   . Early death Neg Hx   . Hearing loss Neg Hx   . Hyperlipidemia Neg Hx   .  Hypertension Neg Hx   . Kidney disease Neg Hx   . Stroke Neg Hx      Review of Systems  Constitutional: Positive for chills, fever and malaise/fatigue.  HENT: Positive for congestion. Negative for drooling, ear discharge, ear pain and trouble swallowing.   Eyes: Negative.   Respiratory: Positive for cough. Negative for shortness of breath and stridor.   Cardiovascular: Negative.   Gastrointestinal: Negative for abdominal pain, diarrhea and vomiting.  Genitourinary: Negative for dysuria, frequency and hematuria.  Musculoskeletal: Positive for back pain and myalgias. Negative for neck pain.  Skin: Negative.   Neurological: Positive for headaches.  Endo/Heme/Allergies: Negative.   Psychiatric/Behavioral: Negative.    Vitals:   12/14/16 1131  BP: 124/84  Pulse: (!)  103  Resp: 18  Temp: 99.2 F (37.3 C)     Physical Exam   ASSESSMENT & PLAN:Samantha Ball was seen today for sore throat, fever, fatigue, chills and headache.  Diagnoses and all orders for this visit:  Acute pharyngitis, unspecified etiology  Start Augmentin. Pharyngitis instructions given. Return if worse.     Agustina Caroli, MD [facility]

## 2016-12-21 ENCOUNTER — Other Ambulatory Visit (INDEPENDENT_AMBULATORY_CARE_PROVIDER_SITE_OTHER): Payer: 59

## 2016-12-21 ENCOUNTER — Encounter: Payer: Self-pay | Admitting: Internal Medicine

## 2016-12-21 ENCOUNTER — Ambulatory Visit (INDEPENDENT_AMBULATORY_CARE_PROVIDER_SITE_OTHER): Payer: 59 | Admitting: Internal Medicine

## 2016-12-21 VITALS — BP 140/88 | HR 70 | Temp 97.6°F | Resp 16 | Ht 63.0 in | Wt 212.0 lb

## 2016-12-21 DIAGNOSIS — E785 Hyperlipidemia, unspecified: Secondary | ICD-10-CM

## 2016-12-21 DIAGNOSIS — I10 Essential (primary) hypertension: Secondary | ICD-10-CM

## 2016-12-21 DIAGNOSIS — E876 Hypokalemia: Secondary | ICD-10-CM | POA: Diagnosis not present

## 2016-12-21 LAB — LIPID PANEL
Cholesterol: 155 mg/dL (ref 0–200)
HDL: 32.5 mg/dL — ABNORMAL LOW (ref >39.00)
NonHDL: 122.37
Total CHOL/HDL Ratio: 5
Triglycerides: 224 mg/dL — ABNORMAL HIGH (ref 0.0–149.0)
VLDL: 44.8 mg/dL — ABNORMAL HIGH (ref 0.0–40.0)

## 2016-12-21 LAB — BASIC METABOLIC PANEL
BUN: 10 mg/dL (ref 6–23)
CO2: 26 mEq/L (ref 19–32)
Calcium: 9.5 mg/dL (ref 8.4–10.5)
Chloride: 107 mEq/L (ref 96–112)
Creatinine, Ser: 0.84 mg/dL (ref 0.40–1.20)
GFR: 96.06 mL/min (ref >60.00)
Glucose, Bld: 110 mg/dL — ABNORMAL HIGH (ref 70–99)
Potassium: 3.8 mEq/L (ref 3.5–5.1)
Sodium: 140 mEq/L (ref 135–145)

## 2016-12-21 LAB — LDL CHOLESTEROL, DIRECT: Direct LDL: 99 mg/dL

## 2016-12-21 NOTE — Patient Instructions (Signed)
Hypertension Hypertension, commonly called high blood pressure, is when the force of blood pumping through your arteries is too strong. Your arteries are the blood vessels that carry blood from your heart throughout your body. A blood pressure reading consists of a higher number over a lower number, such as 110/72. The higher number (systolic) is the pressure inside your arteries when your heart pumps. The lower number (diastolic) is the pressure inside your arteries when your heart relaxes. Ideally you want your blood pressure below 120/80. Hypertension forces your heart to work harder to pump blood. Your arteries may become narrow or stiff. Having untreated or uncontrolled hypertension can cause heart attack, stroke, kidney disease, and other problems. What increases the risk? Some risk factors for high blood pressure are controllable. Others are not. Risk factors you cannot control include:  Race. You may be at higher risk if you are African American.  Age. Risk increases with age.  Gender. Men are at higher risk than women before age 45 years. After age 65, women are at higher risk than men. Risk factors you can control include:  Not getting enough exercise or physical activity.  Being overweight.  Getting too much fat, sugar, calories, or salt in your diet.  Drinking too much alcohol. What are the signs or symptoms? Hypertension does not usually cause signs or symptoms. Extremely high blood pressure (hypertensive crisis) may cause headache, anxiety, shortness of breath, and nosebleed. How is this diagnosed? To check if you have hypertension, your health care provider will measure your blood pressure while you are seated, with your arm held at the level of your heart. It should be measured at least twice using the same arm. Certain conditions can cause a difference in blood pressure between your right and left arms. A blood pressure reading that is higher than normal on one occasion does  not mean that you need treatment. If it is not clear whether you have high blood pressure, you may be asked to return on a different day to have your blood pressure checked again. Or, you may be asked to monitor your blood pressure at home for 1 or more weeks. How is this treated? Treating high blood pressure includes making lifestyle changes and possibly taking medicine. Living a healthy lifestyle can help lower high blood pressure. You may need to change some of your habits. Lifestyle changes may include:  Following the DASH diet. This diet is high in fruits, vegetables, and whole grains. It is low in salt, red meat, and added sugars.  Keep your sodium intake below 2,300 mg per day.  Getting at least 30-45 minutes of aerobic exercise at least 4 times per week.  Losing weight if necessary.  Not smoking.  Limiting alcoholic beverages.  Learning ways to reduce stress. Your health care provider may prescribe medicine if lifestyle changes are not enough to get your blood pressure under control, and if one of the following is true:  You are 18-59 years of age and your systolic blood pressure is above 140.  You are 60 years of age or older, and your systolic blood pressure is above 150.  Your diastolic blood pressure is above 90.  You have diabetes, and your systolic blood pressure is over 140 or your diastolic blood pressure is over 90.  You have kidney disease and your blood pressure is above 140/90.  You have heart disease and your blood pressure is above 140/90. Your personal target blood pressure may vary depending on your medical   conditions, your age, and other factors. Follow these instructions at home:  Have your blood pressure rechecked as directed by your health care provider.  Take medicines only as directed by your health care provider. Follow the directions carefully. Blood pressure medicines must be taken as prescribed. The medicine does not work as well when you skip  doses. Skipping doses also puts you at risk for problems.  Do not smoke.  Monitor your blood pressure at home as directed by your health care provider. Contact a health care provider if:  You think you are having a reaction to medicines taken.  You have recurrent headaches or feel dizzy.  You have swelling in your ankles.  You have trouble with your vision. Get help right away if:  You develop a severe headache or confusion.  You have unusual weakness, numbness, or feel faint.  You have severe chest or abdominal pain.  You vomit repeatedly.  You have trouble breathing. This information is not intended to replace advice given to you by your health care provider. Make sure you discuss any questions you have with your health care provider. Document Released: 12/06/2005 Document Revised: 05/13/2016 Document Reviewed: 09/28/2013 Elsevier Interactive Patient Education  2017 [employer]vier Inc[employer]  

## 2016-12-21 NOTE — Progress Notes (Signed)
Pre visit review using our clinic review tool, if applicable. No additional management support is needed unless otherwise documented below in the visit note. 

## 2016-12-21 NOTE — Progress Notes (Signed)
Subjective:  Patient ID: Samantha Ball, female    DOB: 1976/09/10  Age: 42 y.o. MRN: [id number]  CC: Hyperlipidemia and Hypertension   HPI Samantha Ball presents for a f/up on HTN - she tells me that her BP has been well controlled at home. She feels well and offers no complains. A sleep study is scheduled soon regarding the snoring.  Outpatient Medications Prior to Visit  Medication Sig Dispense Refill  . esomeprazole (NEXIUM) 40 MG capsule Take 1 capsule (40 mg total) by mouth daily at 12 noon. 90 capsule 3  . hydrochlorothiazide (MICROZIDE) 12.5 MG capsule Take 1 capsule (12.5 mg total) by mouth daily. 90 capsule 1  . naproxen (NAPROSYN) 375 MG tablet TAKE 1 TABLET BY MOUTH TWICE A DAY WITH A MEAL 180 tablet 1  . nebivolol (BYSTOLIC) 10 MG tablet Take 1 tablet (10 mg total) by mouth daily. 63 tablet 0  . amoxicillin-clavulanate (AUGMENTIN) 875-125 MG tablet Take 1 tablet by mouth 2 (two) times daily. 20 tablet 0   No facility-administered medications prior to visit.     ROS Review of Systems  Constitutional: Negative for activity change, appetite change, diaphoresis, fatigue and unexpected weight change.  HENT: Negative.   Eyes: Negative for visual disturbance.  Respiratory: Negative for cough, chest tightness, shortness of breath and wheezing.   Cardiovascular: Negative for chest pain, palpitations and leg swelling.  Gastrointestinal: Negative for abdominal pain, constipation, diarrhea and vomiting.  Genitourinary: Negative.  Negative for decreased urine volume, difficulty urinating, dysuria, flank pain, hematuria and urgency.  Musculoskeletal: Negative.  Negative for arthralgias.  Skin: Negative.   Allergic/Immunologic: Negative.   Neurological: Negative.  Negative for dizziness, weakness, light-headedness and headaches.  Hematological: Negative for adenopathy. Does not bruise/bleed easily.  Psychiatric/Behavioral: Negative.     Objective:  BP 140/88 (BP Location:  Left Arm, Patient Position: Sitting, Cuff Size: Normal)   Pulse 70   Temp 97.6 F (36.4 C) (Oral)   Resp 16   Ht 5\' 3"  (1.6 m)   Wt 212 lb (96.2 kg)   SpO2 98%   BMI 37.55 kg/m   BP Readings from Last 3 Encounters:  12/21/16 140/88  12/14/16 124/84  12/07/16 126/82    Wt Readings from Last 3 Encounters:  12/21/16 212 lb (96.2 kg)  12/14/16 204 lb (92.5 kg)  12/07/16 213 lb (96.6 kg)    Physical Exam  Constitutional: She is oriented to person, place, and time. No distress.  HENT:  Mouth/Throat: Oropharynx is clear and moist. No oropharyngeal exudate.  Eyes: Conjunctivae are normal. Right eye exhibits no discharge. Left eye exhibits no discharge. No scleral icterus.  Neck: Normal range of motion. Neck supple. No JVD present. No tracheal deviation present. No thyromegaly present.  Cardiovascular: Normal rate, regular rhythm, normal heart sounds and intact distal pulses.  Exam reveals no gallop and no friction rub.   No murmur heard. Pulmonary/Chest: Effort normal and breath sounds normal. No stridor. No respiratory distress. She has no wheezes. She has no rales. She exhibits no tenderness.  Abdominal: Soft. Bowel sounds are normal. She exhibits no distension and no mass. There is no tenderness. There is no rebound and no guarding.  Musculoskeletal: Normal range of motion. She exhibits no edema, tenderness or deformity.  Lymphadenopathy:    She has no cervical adenopathy.  Neurological: She is oriented to person, place, and time.  Skin: Skin is warm and dry. No rash noted. She is not diaphoretic. No erythema. No pallor.  Vitals reviewed.   Lab Results  Component Value Date   WBC 8.8 06/01/2016   HGB 12.3 06/01/2016   HCT 37.1 06/01/2016   PLT 347.0 06/01/2016   GLUCOSE 110 (H) 12/21/2016   CHOL 155 12/21/2016   TRIG 224.0 (H) 12/21/2016   HDL 32.50 (L) 12/21/2016   LDLDIRECT 99.0 12/21/2016   LDLCALC 115 (H) 07/11/2013   ALT 8 06/01/2016   AST 11 06/01/2016   NA 140  12/21/2016   K 3.8 12/21/2016   CL 107 12/21/2016   CREATININE 0.84 12/21/2016   BUN 10 12/21/2016   CO2 26 12/21/2016   TSH 1.77 06/01/2016    Mm Diag Breast Tomo Uni Left  Result Date: 06/25/2016 CLINICAL DATA:  Screening recall for a possible asymmetry in the left breast. Screening exam was the patient's baseline study. EXAM: 2D DIGITAL DIAGNOSTIC UNILATERAL LEFT MAMMOGRAM WITH CAD AND ADJUNCT TOMO COMPARISON:  06/14/2016 ACR Breast Density Category b: There are scattered areas of fibroglandular density. FINDINGS: Area of possible asymmetry disperses on the 2D and 3D diagnostic images consistent with normal fibroglandular tissue. There is no discrete mass or distortion. There are no calcifications. Mammographic images were processed with CAD. IMPRESSION: Normal exam.  No evidence of malignancy. RECOMMENDATION: Screening mammogram in one year.(Code:SM-B-01Y) I have discussed the findings and recommendations with the patient. Results were also provided in writing at the conclusion of the visit. If applicable, a reminder letter will be sent to the patient regarding the next appointment. BI-RADS CATEGORY  1: Negative. Electronically Signed   By: Lajean Manes M.D.   On: 06/25/2016 15:16    Assessment & Plan:   [name] was seen today for hyperlipidemia and hypertension.  Diagnoses and all orders for this visit:  Essential hypertension, benign- Her BP is well controlled, lytes and renal function are normal -     Basic metabolic panel; Future  Hypokalemia- her K+ level is normal now, will cont to monitor this in light of thiazide diuretic therapy -     Basic metabolic panel; Future  Hyperlipidemia LDL goal <130- [name] is less than 1% so I do not recommend that she take a statin. -     Lipid panel; Future   I have discontinued Samantha Ball's amoxicillin-clavulanate. I am also having her maintain her hydrochlorothiazide, naproxen, esomeprazole, and nebivolol.  No orders of  the defined types were placed in this encounter.    Follow-up: Return in about 6 months (around 06/20/2017).  Samantha Calico, MD

## 2017-01-07 ENCOUNTER — Telehealth: Payer: Self-pay | Admitting: Neurology

## 2017-01-07 DIAGNOSIS — R0683 Snoring: Secondary | ICD-10-CM

## 2017-01-07 DIAGNOSIS — E669 Obesity, unspecified: Secondary | ICD-10-CM

## 2017-01-07 DIAGNOSIS — G4761 Periodic limb movement disorder: Secondary | ICD-10-CM

## 2017-01-07 DIAGNOSIS — R51 Headache: Secondary | ICD-10-CM

## 2017-01-07 DIAGNOSIS — R519 Headache, unspecified: Secondary | ICD-10-CM

## 2017-01-07 DIAGNOSIS — R351 Nocturia: Secondary | ICD-10-CM

## 2017-01-07 DIAGNOSIS — G2581 Restless legs syndrome: Secondary | ICD-10-CM

## 2017-01-07 NOTE — Telephone Encounter (Signed)
[employer] denied split but suggest HST.  Can I get an order for HST?

## 2017-01-07 NOTE — Telephone Encounter (Signed)
Order has been placed.

## 2017-01-20 ENCOUNTER — Encounter: Payer: Self-pay | Admitting: Family Medicine

## 2017-01-20 ENCOUNTER — Ambulatory Visit (INDEPENDENT_AMBULATORY_CARE_PROVIDER_SITE_OTHER): Payer: 59 | Admitting: Family Medicine

## 2017-01-20 VITALS — BP 138/90 | HR 80 | Temp 98.7°F | Wt 208.2 lb

## 2017-01-20 DIAGNOSIS — J069 Acute upper respiratory infection, unspecified: Secondary | ICD-10-CM

## 2017-01-20 NOTE — Progress Notes (Signed)
Subjective:    Patient ID: Samantha Ball, female    DOB: 11/16/1976, 41 y.o.   MRN: [id number]  HPI  Mr. Ptacek is a 41 year old female who presents today with sinus pressure/pain that started 3 days ago.  Associated symptoms of nasal congestion with clear drainage, bilateral ear pain/pressure, post nasal drip, sneezing, and nonproductive cough. Denies fever, chills, sweats, N/V/D, neck stiffness. Associated tinnitus that has improved; patient wears a hearing aide and has chronic tinnitus and she reports that tinnitus has improved and is at baseline.  Reports that symptoms have improved each day. Influenza vaccine this year. Denies recent sick contact exposure.  Last antibiotic therapy in 12/17 for pharyngitis.  She is not a smoker and she denies history asthma/bronchitis.  Review of Systems  Constitutional: Negative for chills, fatigue and fever.  HENT: Positive for congestion, postnasal drip, rhinorrhea, sinus pain, sinus pressure and sneezing. Negative for sore throat.   Respiratory: Positive for cough. Negative for wheezing.   Cardiovascular: Negative for chest pain and palpitations.  Gastrointestinal: Negative for abdominal pain, diarrhea, nausea and vomiting.  Musculoskeletal: Negative for myalgias.  Neurological: Negative for dizziness and headaches.   Past Medical History:  Diagnosis Date  . Hyperlipidemia   . Hypertension      Social History   Social History  . Marital status: Married    Spouse name: N/A  . Number of children: N/A  . Years of education: N/A   Occupational History  . Not on file.   Social History Main Topics  . Smoking status: Never Smoker  . Smokeless tobacco: Never Used  . Alcohol use No  . Drug use: No  . Sexual activity: Yes    Birth control/ protection: Surgical     Comment: BTL   Other Topics Concern  . Not on file   Social History Narrative  . No narrative on file    Past Surgical History:  Procedure Laterality Date  . TUBAL  LIGATION    . wisdom teeth  2000    Family History  Problem Relation Age of Onset  . Heart disease Father   . Cancer Neg Hx   . Alcohol abuse Neg Hx   . COPD Neg Hx   . Diabetes Neg Hx   . Depression Neg Hx   . Drug abuse Neg Hx   . Early death Neg Hx   . Hearing loss Neg Hx   . Hyperlipidemia Neg Hx   . Hypertension Neg Hx   . Kidney disease Neg Hx   . Stroke Neg Hx     Allergies  Allergen Reactions  . Amlodipine     constipation    Current Outpatient Prescriptions on File Prior to Visit  Medication Sig Dispense Refill  . esomeprazole (NEXIUM) 40 MG capsule Take 1 capsule (40 mg total) by mouth daily at 12 noon. 90 capsule 3  . hydrochlorothiazide (MICROZIDE) 12.5 MG capsule Take 1 capsule (12.5 mg total) by mouth daily. 90 capsule 1  . naproxen (NAPROSYN) 375 MG tablet TAKE 1 TABLET BY MOUTH TWICE A DAY WITH A MEAL 180 tablet 1  . nebivolol (BYSTOLIC) 10 MG tablet Take 1 tablet (10 mg total) by mouth daily. 63 tablet 0   No current facility-administered medications on file prior to visit.     BP 138/90 (BP Location: Left Arm, Patient Position: Sitting, Cuff Size: Normal)   Pulse 80   Temp 98.7 F (37.1 C) (Oral)   Wt 208 lb 3.2  oz (94.4 kg)   SpO2 98%   BMI 36.88 kg/m       Objective:   Physical Exam  Constitutional: She is oriented to person, place, and time. She appears well-developed and well-nourished.  HENT:  Right Ear: Tympanic membrane normal.  Left Ear: Tympanic membrane normal.  Nose: No rhinorrhea. Right sinus exhibits no maxillary sinus tenderness and no frontal sinus tenderness. Left sinus exhibits no maxillary sinus tenderness and no frontal sinus tenderness.  Mouth/Throat: Mucous membranes are normal. No oropharyngeal exudate or posterior oropharyngeal erythema.  Eyes: Pupils are equal, round, and reactive to light. No scleral icterus.  Neck: Neck supple.  Cardiovascular: Normal rate and regular rhythm.   Pulmonary/Chest: Effort normal and  breath sounds normal. She has no wheezes. She has no rales.  Abdominal: Soft. Bowel sounds are normal. There is no tenderness.  Lymphadenopathy:    She has cervical adenopathy.  Neurological: She is alert and oriented to person, place, and time.  Skin: Skin is warm and dry. No rash noted.        Assessment & Plan:  1. Acute upper respiratory infection Advised patient on supportive measures:  Get rest, drink plenty of fluids, and use tylenol as needed for pain. Follow up if fever >101, if symptoms worsen or if symptoms are not improved in 3 to 4 days. Patient verbalizes understanding.   Allegra, Claritin, or Zyrtec can be used with symptoms of sneezing and post nasal drip. Mucinex DM for cough as needed.  Delano Metz, FNP-C

## 2017-01-20 NOTE — Patient Instructions (Signed)
Your symptoms today are most likely caused by viral illness. Please drink plenty of water enough for your urine to be pale yellow or clear. You may use Tylenol 325 mg every 6 hours as needed. Mucinex DM can be used for cough.  Allegra, Claritin, or Zyrtec can be used for symptoms of sneezing and post nasal drip.  Follow-up for evaluation if your symptoms do not improve in 3-4 days, worsen, or you develop a fever greater than 100.   Upper Respiratory Infection, Adult Most upper respiratory infections (URIs) are caused by a virus. A URI affects the nose, throat, and upper air passages. The most common type of URI is often called "the common cold." Follow these instructions at home:  Take medicines only as told by your doctor.  Gargle warm saltwater or take cough drops to comfort your throat as told by your doctor.  Use a warm mist humidifier or inhale steam from a shower to increase air moisture. This may make it easier to breathe.  Drink enough fluid to keep your pee (urine) clear or pale yellow.  Eat soups and other clear broths.  Have a healthy diet.  Rest as needed.  Go back to work when your fever is gone or your doctor says it is okay.  You may need to stay home longer to avoid giving your URI to others.  You can also wear a face mask and wash your hands often to prevent spread of the virus.  Use your inhaler more if you have asthma.  Do not use any tobacco products, including cigarettes, chewing tobacco, or electronic cigarettes. If you need help quitting, ask your doctor. Contact a doctor if:  You are getting worse, not better.  Your symptoms are not helped by medicine.  You have chills.  You are getting more short of breath.  You have brown or red mucus.  You have yellow or brown discharge from your nose.  You have pain in your face, especially when you bend forward.  You have a fever.  You have puffy (swollen) neck glands.  You have pain while  swallowing.  You have white areas in the back of your throat. Get help right away if:  You have very bad or constant:  Headache.  Ear pain.  Pain in your forehead, behind your eyes, and over your cheekbones (sinus pain).  Chest pain.  You have long-lasting (chronic) lung disease and any of the following:  Wheezing.  Long-lasting cough.  Coughing up blood.  A change in your usual mucus.  You have a stiff neck.  You have changes in your:  Vision.  Hearing.  Thinking.  Mood. This information is not intended to replace advice given to you by your health care provider. Make sure you discuss any questions you have with your health care provider. Document Released: 05/24/2008 Document Revised: 08/08/2016 Document Reviewed: 03/13/2014 Elsevier Interactive Patient Education  2017 [employer].

## 2017-01-20 NOTE — Progress Notes (Signed)
Pre visit review using our clinic review tool, if applicable. No additional management support is needed unless otherwise documented below in the visit note. 

## 2017-03-19 ENCOUNTER — Other Ambulatory Visit: Payer: Self-pay | Admitting: Internal Medicine

## 2017-04-04 ENCOUNTER — Encounter: Payer: Self-pay | Admitting: Internal Medicine

## 2017-04-04 ENCOUNTER — Ambulatory Visit (INDEPENDENT_AMBULATORY_CARE_PROVIDER_SITE_OTHER)
Admission: RE | Admit: 2017-04-04 | Discharge: 2017-04-04 | Disposition: A | Discharge status: Home / Self Care | Payer: 59 | Source: Ambulatory Visit | Attending: Internal Medicine | Admitting: Internal Medicine

## 2017-04-04 ENCOUNTER — Ambulatory Visit (INDEPENDENT_AMBULATORY_CARE_PROVIDER_SITE_OTHER): Payer: 59 | Admitting: Internal Medicine

## 2017-04-04 VITALS — BP 140/100 | HR 69 | Temp 98.8°F | Resp 16 | Ht 63.0 in | Wt 206.5 lb

## 2017-04-04 DIAGNOSIS — J988 Other specified respiratory disorders: Secondary | ICD-10-CM | POA: Diagnosis not present

## 2017-04-04 DIAGNOSIS — R059 Cough, unspecified: Secondary | ICD-10-CM

## 2017-04-04 DIAGNOSIS — I10 Essential (primary) hypertension: Secondary | ICD-10-CM | POA: Diagnosis not present

## 2017-04-04 DIAGNOSIS — R05 Cough: Secondary | ICD-10-CM

## 2017-04-04 MED ORDER — CHLORTHALIDONE 25 MG PO TABS: 25.0000 mg | ORAL_TABLET | Freq: Every day | ORAL | 1 refills | Status: DC

## 2017-04-04 MED ORDER — HYDROCODONE-HOMATROPINE 5-1.5 MG/5ML PO SYRP: 5.0000 mL | ORAL_SOLUTION | Freq: Three times a day (TID) | ORAL | 0 refills | Status: DC | PRN

## 2017-04-04 NOTE — Progress Notes (Signed)
Pre visit review using our clinic review tool, if applicable. No additional management support is needed unless otherwise documented below in the visit note. 

## 2017-04-04 NOTE — Progress Notes (Signed)
Subjective:  Patient ID: Samantha Ball, female    DOB: 1976/12/10  Age: 41 y.o. MRN: [id number]  CC: Hypertension and Cough   HPI AMILLION SCOBEE presents for a one-week history of cough that was initially productive of greenish bloody phlegm. Over the last few days the cough has improved and is now mostly nonproductive. She denies chest pain, hemoptysis, shortness of breath, wheezing, night sweats, fever, chills.  She is concerned that her blood pressure is not adequately well-controlled as she has had some minor headaches recently. She has been taking hydrochlorothiazide but stopped taking Bystolic because she didn't think it was helping to lower her blood pressure.  Outpatient Medications Prior to Visit  Medication Sig Dispense Refill  . esomeprazole (NEXIUM) 40 MG capsule Take 1 capsule (40 mg total) by mouth daily at 12 noon. 90 capsule 3  . hydrochlorothiazide (MICROZIDE) 12.5 MG capsule Take 1 capsule (12.5 mg total) by mouth daily. 90 capsule 1  . naproxen (NAPROSYN) 375 MG tablet TAKE 1 TABLET BY MOUTH TWICE A DAY WITH A MEAL 180 tablet 1  . esomeprazole (NEXIUM) 40 MG capsule TAKE ONE CAPSULE BY MOUTH EVERY DAY 30 capsule 4  . nebivolol (BYSTOLIC) 10 MG tablet Take 1 tablet (10 mg total) by mouth daily. (Patient not taking: Reported on 04/04/2017) 63 tablet 0   No facility-administered medications prior to visit.     ROS Review of Systems  Constitutional: Negative for appetite change, chills, diaphoresis, fatigue and fever.  HENT: Negative.  Negative for sore throat and trouble swallowing.   Eyes: Negative for visual disturbance.  Respiratory: Positive for cough. Negative for chest tightness, shortness of breath, wheezing and stridor.   Cardiovascular: Negative for chest pain, palpitations and leg swelling.  Gastrointestinal: Negative for abdominal pain, constipation, diarrhea, nausea and vomiting.  Endocrine: Negative.   Genitourinary: Negative.  Negative for difficulty  urinating.  Musculoskeletal: Negative.  Negative for back pain, myalgias and neck pain.  Skin: Negative.   Allergic/Immunologic: Negative.   Neurological: Positive for headaches. Negative for dizziness, weakness and numbness.  Hematological: Negative for adenopathy. Does not bruise/bleed easily.  Psychiatric/Behavioral: Negative.     Objective:  BP (!) 140/100 (BP Location: Left Arm, Patient Position: Sitting, Cuff Size: Large)   Pulse 69   Temp 98.8 F (37.1 C) (Oral)   Resp 16   Ht 5\' 3"  (1.6 m)   Wt 206 lb 8 oz (93.7 kg)   LMP 04/03/2017   SpO2 99%   BMI 36.58 kg/m   BP Readings from Last 3 Encounters:  04/04/17 (!) 140/100  01/20/17 138/90  12/21/16 140/88    Wt Readings from Last 3 Encounters:  04/04/17 206 lb 8 oz (93.7 kg)  01/20/17 208 lb 3.2 oz (94.4 kg)  12/21/16 212 lb (96.2 kg)    Physical Exam  Constitutional: She is oriented to person, place, and time.  Non-toxic appearance. She does not have a sickly appearance. She does not appear ill. No distress.  HENT:  Mouth/Throat: Oropharynx is clear and moist. No oropharyngeal exudate.  Eyes: Conjunctivae are normal. Right eye exhibits no discharge. Left eye exhibits no discharge. No scleral icterus.  Neck: Normal range of motion. Neck supple. No JVD present. No tracheal deviation present. No thyromegaly present.  Cardiovascular: Normal rate, regular rhythm, normal heart sounds and intact distal pulses.  Exam reveals no gallop and no friction rub.   No murmur heard. Pulmonary/Chest: Effort normal and breath sounds normal. No stridor. No respiratory distress. She  has no wheezes. She has no rales. She exhibits no tenderness.  Abdominal: Soft. Bowel sounds are normal. She exhibits no distension and no mass. There is no tenderness. There is no rebound and no guarding.  Musculoskeletal: Normal range of motion. She exhibits no edema, tenderness or deformity.  Lymphadenopathy:    She has no cervical adenopathy.    Neurological: She is oriented to person, place, and time.  Skin: Skin is warm and dry. No rash noted. She is not diaphoretic. No erythema. No pallor.  Vitals reviewed.   Lab Results  Component Value Date   WBC 8.8 06/01/2016   HGB 12.3 06/01/2016   HCT 37.1 06/01/2016   PLT 347.0 06/01/2016   GLUCOSE 110 (H) 12/21/2016   CHOL 155 12/21/2016   TRIG 224.0 (H) 12/21/2016   HDL 32.50 (L) 12/21/2016   LDLDIRECT 99.0 12/21/2016   LDLCALC 115 (H) 07/11/2013   ALT 8 06/01/2016   AST 11 06/01/2016   NA 140 12/21/2016   K 3.8 12/21/2016   CL 107 12/21/2016   CREATININE 0.84 12/21/2016   BUN 10 12/21/2016   CO2 26 12/21/2016   TSH 1.77 06/01/2016    Mm Diag Breast Tomo Uni Left  Result Date: 06/25/2016 CLINICAL DATA:  Screening recall for a possible asymmetry in the left breast. Screening exam was the patient's baseline study. EXAM: 2D DIGITAL DIAGNOSTIC UNILATERAL LEFT MAMMOGRAM WITH CAD AND ADJUNCT TOMO COMPARISON:  06/14/2016 ACR Breast Density Category b: There are scattered areas of fibroglandular density. FINDINGS: Area of possible asymmetry disperses on the 2D and 3D diagnostic images consistent with normal fibroglandular tissue. There is no discrete mass or distortion. There are no calcifications. Mammographic images were processed with CAD. IMPRESSION: Normal exam.  No evidence of malignancy. RECOMMENDATION: Screening mammogram in one year.(Code:[id number]-B-01Y) I have discussed the findings and recommendations with the patient. Results were also provided in writing at the conclusion of the visit. If applicable, a reminder letter will be sent to the patient regarding the next appointment. BI-RADS CATEGORY  1: Negative. Electronically Signed   By: Lajean Manes M.D.   On: 06/25/2016 15:16   Dg Chest 2 View  Result Date: 04/04/2017 CLINICAL DATA:  Cough, congestion and dyspnea for 1 week. EXAM: CHEST  2 VIEW COMPARISON:  None. FINDINGS: Cardiomediastinal silhouette is normal. No pleural  effusions or focal consolidations. Trachea projects midline and there is no pneumothorax. Soft tissue planes and included osseous structures are non-suspicious. IMPRESSION: Normal chest. Electronically Signed   By: Elon Alas M.D.   On: 04/04/2017 16:19     Assessment & Plan:   Anniya was seen today for hypertension and cough.  Diagnoses and all orders for this visit:  Essential hypertension, benign- Her blood pressure is not well controlled and she is experiencing headaches, she is not willing to restart Bystolic so I will upgrade her to a more potent thiazide diuretic to try to achieve better blood pressure control. -     chlorthalidone (HYGROTON) 25 MG tablet; Take 1 tablet (25 mg total) by mouth daily.  Cough- her chest x-ray is negative for mass, infiltrate, edema, or effusion. -     HYDROcodone-homatropine (HYCODAN) 5-1.5 MG/5ML syrup; Take 5 mLs by mouth every 8 (eight) hours as needed for cough. -     DG Chest 2 View; Future  RTI (respiratory tract infection)- her symptoms are consistent with a viral syndrome, antibiotics are not indicated, will offer symptom relief with Hycodan as needed. -  HYDROcodone-homatropine (HYCODAN) 5-1.5 MG/5ML syrup; Take 5 mLs by mouth every 8 (eight) hours as needed for cough.   I have discontinued Ms. Cawood's hydrochlorothiazide, naproxen, and nebivolol. I am also having her start on chlorthalidone and HYDROcodone-homatropine. Additionally, I am having her maintain her esomeprazole.  Meds ordered this encounter  Medications  . chlorthalidone (HYGROTON) 25 MG tablet    Sig: Take 1 tablet (25 mg total) by mouth daily.    Dispense:  90 tablet    Refill:  1  . HYDROcodone-homatropine (HYCODAN) 5-1.5 MG/5ML syrup    Sig: Take 5 mLs by mouth every 8 (eight) hours as needed for cough.    Dispense:  120 mL    Refill:  0     Follow-up: Return in about 4 weeks (around 05/02/2017).  Scarlette Calico, MD

## 2017-04-04 NOTE — Patient Instructions (Signed)

## 2017-04-06 ENCOUNTER — Ambulatory Visit (INDEPENDENT_AMBULATORY_CARE_PROVIDER_SITE_OTHER): Payer: 59 | Admitting: Neurology

## 2017-04-06 DIAGNOSIS — G4733 Obstructive sleep apnea (adult) (pediatric): Secondary | ICD-10-CM

## 2017-04-12 ENCOUNTER — Telehealth: Payer: Self-pay

## 2017-04-12 NOTE — Telephone Encounter (Signed)
-----   Message from Star Age, MD sent at 04/12/2017  7:54 AM EDT ----- Patient referred by Dr. Ronnald Ramp, seen by me on 12/07/16, diagnostic HST on 04/06/17.    Please call and notify the patient that the recent HST showed borderline obstructive sleep apnea. It may be treating to see if she feels better after treatment. To that end I recommend treatment for this in the form of autoPAP, which means, that we don't have to bring her back for a sleep study with CPAP, but will let her try an autoPAP machine at home, through a DME company (of her choice, or as per insurance requirement). The DME representative will educate her on how to use the machine, how to put the mask on, etc. I have placed an order in the chart. Please send referral, talk to patient, send report to PCP and referring MD. We will need a FU in sleep clinic for 10 weeks post-PAP set up, please arrange that as well. Thanks,   Star Age, MD, PhD [facility] ([place])  Marland Kitchen

## 2017-04-12 NOTE — Procedures (Signed)
[facility], [place], [place] [address]  NAME: Samantha Ball DOB: [birth year] MEDICAL RECORD [id number]  DOS: 04/06/17 REFERRING PHYSICIAN: [name], MD  Study Performed:  HST/Out of Center Sleep Test  HISTORY: 41 year old woman with a history of hypertension, iron deficiency anemia, hearing loss R ear, joint pain, and obesity, who reports difficulty with her sleep including sleep maintenance issues, snoring, morning HAs and daytime somnolence. Her Epworth sleepiness score is 17 out of 24, her fatigue score is 47 out of 63. BMI of 37.7.  STUDY RESULTS:  Total Recording Time:  7h 11m Total Apnea/Hypopnea Index (AHI):  5.9/hour Average Oxygen Saturation: 96% Lowest Oxygen Saturation: 89% Average Mean Heart Rate:   90 bpm  IMPRESSION:  Obstructive Sleep Apnea (OSA)    RECOMMENDATION: This home sleep test demonstrates overall borderline obstructive sleep apnea with a total AHI of 5.9/hour and O2 nadir of 89%. Given the patient's medical history and sleep related complaints, treatment with positive airway pressure (in the form of autoPAP) should be considered. This will be discussed with the patient. Weight loss may help. A full night CPAP titration study for proper treatment settings and mask fitting can be considered. The patient should be cautioned not to drive, work at heights, or operate dangerous or heavy equipment when tired or sleepy. Review and reiteration of good sleep hygiene measures should be pursued with any patient. The patient and his referring provider will be notified of the test results. The patient will be seen in follow up in sleep clinic at [facility].  I certify that I have reviewed the raw data recording prior to the issuance of this report in accordance with the standards of Accreditation of the American Academy of Sleep medicine (AASM).  Star Age, MD, PhD [facility]) Diplomat, ABPN  (neurology and sleep)

## 2017-04-12 NOTE — Addendum Note (Signed)
Addended by: Star Age on: 04/12/2017 07:54 AM   Modules accepted: Orders

## 2017-04-12 NOTE — Telephone Encounter (Signed)
I spoke to patient and she is aware of results and recommendations. She is willing to proceed with treatment. I will send orders to [facility]. I sent report to PCP. And patient made f/u appt in July.

## 2017-04-12 NOTE — Progress Notes (Signed)
Patient referred by Dr. Ronnald Ramp, seen by me on 12/07/16, diagnostic HST on 04/06/17.    Please call and notify the patient that the recent HST showed borderline obstructive sleep apnea. It may be treating to see if she feels better after treatment. To that end I recommend treatment for this in the form of autoPAP, which means, that we don't have to bring her back for a sleep study with CPAP, but will let her try an autoPAP machine at home, through a DME company (of her choice, or as per insurance requirement). The DME representative will educate her on how to use the machine, how to put the mask on, etc. I have placed an order in the chart. Please send referral, talk to patient, send report to PCP and referring MD. We will need a FU in sleep clinic for 10 weeks post-PAP set up, please arrange that as well. Thanks,   Star Age, MD, PhD [facility] ([place])  Marland Kitchen

## 2017-04-28 DIAGNOSIS — G4733 Obstructive sleep apnea (adult) (pediatric): Secondary | ICD-10-CM | POA: Diagnosis not present

## 2017-05-09 ENCOUNTER — Encounter: Payer: Self-pay | Admitting: Internal Medicine

## 2017-05-09 ENCOUNTER — Ambulatory Visit (INDEPENDENT_AMBULATORY_CARE_PROVIDER_SITE_OTHER): Payer: 59 | Admitting: Internal Medicine

## 2017-05-09 VITALS — BP 130/100 | HR 83 | Temp 98.2°F | Resp 16 | Ht 63.0 in | Wt 200.5 lb

## 2017-05-09 DIAGNOSIS — I10 Essential (primary) hypertension: Secondary | ICD-10-CM

## 2017-05-09 DIAGNOSIS — J301 Allergic rhinitis due to pollen: Secondary | ICD-10-CM | POA: Diagnosis not present

## 2017-05-09 MED ORDER — LEVOCETIRIZINE DIHYDROCHLORIDE 5 MG PO TABS: 5.0000 mg | ORAL_TABLET | Freq: Every evening | ORAL | 3 refills | Status: DC

## 2017-05-09 MED ORDER — NEBIVOLOL HCL 5 MG PO TABS: 5.0000 mg | ORAL_TABLET | Freq: Every day | ORAL | 0 refills | Status: DC

## 2017-05-09 MED ORDER — METHYLPREDNISOLONE 4 MG PO TBPK: ORAL_TABLET | ORAL | 0 refills | Status: DC

## 2017-05-09 NOTE — Progress Notes (Signed)
Subjective:  Patient ID: Samantha Ball, female    DOB: [birth year]  Age: 41 y.o. MRN: [id number]  CC: Hypertension   HPI Samantha Ball presents for a blood pressure check. She has not checked Samantha Ball blood pressure since I last saw Samantha Ball but she does complain of a few episodes of headache and blurred vision. She denies chest pain, shortness of breath, palpitations, edema, or fatigue. She also complains of runny nose, nasal congestion, and postnasal drip.  Outpatient Medications Prior to Visit  Medication Sig Dispense Refill  . chlorthalidone (HYGROTON) 25 MG tablet Take 1 tablet (25 mg total) by mouth daily. 90 tablet 1  . esomeprazole (NEXIUM) 40 MG capsule Take 1 capsule (40 mg total) by mouth daily at 12 noon. 90 capsule 3  . HYDROcodone-homatropine (HYCODAN) 5-1.5 MG/5ML syrup Take 5 mLs by mouth every 8 (eight) hours as needed for cough. 120 mL 0   No facility-administered medications prior to visit.     ROS Review of Systems  Constitutional: Negative for activity change, appetite change, diaphoresis, fatigue and unexpected weight change.  HENT: Positive for congestion, postnasal drip and rhinorrhea. Negative for facial swelling, sinus pain, sinus pressure, sore throat and trouble swallowing.   Eyes: Positive for visual disturbance. Negative for photophobia, pain and redness.  Respiratory: Negative.  Negative for cough, chest tightness, shortness of breath and wheezing.   Cardiovascular: Negative for chest pain, palpitations and leg swelling.  Gastrointestinal: Negative for abdominal pain, constipation, diarrhea, nausea and vomiting.  Endocrine: Negative.   Genitourinary: Negative.  Negative for difficulty urinating.  Musculoskeletal: Negative.  Negative for back pain and myalgias.  Allergic/Immunologic: Negative.   Neurological: Positive for headaches. Negative for dizziness, syncope, weakness, light-headedness and numbness.  Hematological: Negative for adenopathy. Does not  bruise/bleed easily.  Psychiatric/Behavioral: Negative.     Objective:  BP (!) 130/100 (BP Location: Left Arm, Patient Position: Sitting, Cuff Size: Normal)   Pulse 83   Temp 98.2 F (36.8 C) (Oral)   Ht 5\' 3"  (1.6 m)   Wt 200 lb 8 oz (90.9 kg)   SpO2 100%   BMI 35.52 kg/m   BP Readings from Last 3 Encounters:  05/09/17 (!) 130/100  04/04/17 (!) 140/100  01/20/17 138/90    Wt Readings from Last 3 Encounters:  05/09/17 200 lb 8 oz (90.9 kg)  04/04/17 206 lb 8 oz (93.7 kg)  01/20/17 208 lb 3.2 oz (94.4 kg)    Physical Exam  Constitutional: No distress.  HENT:  Nose: Mucosal edema present. No rhinorrhea or sinus tenderness. No epistaxis. Right sinus exhibits no maxillary sinus tenderness and no frontal sinus tenderness. Left sinus exhibits no maxillary sinus tenderness and no frontal sinus tenderness.  Mouth/Throat: Oropharynx is clear and moist. No oropharyngeal exudate.  Eyes: Conjunctivae are normal. Right eye exhibits no discharge. Left eye exhibits no discharge. No scleral icterus.  Neck: Normal range of motion. Neck supple. No JVD present. No tracheal deviation present. No thyromegaly present.  Cardiovascular: Normal rate, regular rhythm, normal heart sounds and intact distal pulses.  Exam reveals no gallop and no friction rub.   No murmur heard. Pulmonary/Chest: Effort normal and breath sounds normal. No stridor. No respiratory distress. She has no wheezes. She has no rales. She exhibits no tenderness.  Abdominal: Soft. Bowel sounds are normal. She exhibits no distension and no mass. There is no tenderness. There is no rebound and no guarding.  Musculoskeletal: Normal range of motion. She exhibits no edema or tenderness.  Lymphadenopathy:    She has no cervical adenopathy.  Skin: She is not diaphoretic.  Vitals reviewed.   Lab Results  Component Value Date   WBC 8.8 06/01/2016   HGB 12.3 06/01/2016   HCT 37.1 06/01/2016   PLT 347.0 06/01/2016   GLUCOSE 110 (H)  12/21/2016   CHOL 155 12/21/2016   TRIG 224.0 (H) 12/21/2016   HDL 32.50 (L) 12/21/2016   LDLDIRECT 99.0 12/21/2016   LDLCALC 115 (H) 07/11/2013   ALT 8 06/01/2016   AST 11 06/01/2016   NA 140 12/21/2016   K 3.8 12/21/2016   CL 107 12/21/2016   CREATININE 0.84 12/21/2016   BUN 10 12/21/2016   CO2 26 12/21/2016   TSH 1.77 06/01/2016    Dg Chest 2 View  Result Date: 04/04/2017 CLINICAL DATA:  Cough, congestion and dyspnea for 1 week. EXAM: CHEST  2 VIEW COMPARISON:  None. FINDINGS: Cardiomediastinal silhouette is normal. No pleural effusions or focal consolidations. Trachea projects midline and there is no pneumothorax. Soft tissue planes and included osseous structures are non-suspicious. IMPRESSION: Normal chest. Electronically Signed   By: Elon Alas M.D.   On: 04/04/2017 16:19    Assessment & Plan:   Samantha Ball was seen today for hypertension.  Diagnoses and all orders for this visit:  Essential hypertension, benign- Samantha Ball blood pressure is not adequately well controlled and she is symptomatic, will add nebivolol to the thiazide diuretic for better blood pressure control. -     nebivolol (BYSTOLIC) 5 MG tablet; Take 1 tablet (5 mg total) by mouth daily.  Seasonal allergic rhinitis due to pollen- she is having a rather severe flare of Samantha Ball symptoms so will start an oral antihistamine and a course of systemic steroids. -     levocetirizine (XYZAL) 5 MG tablet; Take 1 tablet (5 mg total) by mouth every evening. -     methylPREDNISolone (MEDROL DOSEPAK) 4 MG TBPK tablet; TAKE AS DIRECTED   I have discontinued Samantha Ball's HYDROcodone-homatropine. I am also having Samantha Ball start on levocetirizine, methylPREDNISolone, and nebivolol. Additionally, I am having Samantha Ball maintain Samantha Ball esomeprazole, chlorthalidone, and naproxen.  Meds ordered this encounter  Medications  . naproxen (NAPROSYN) 250 MG tablet    Sig: Take by mouth 2 (two) times daily with a meal.  . levocetirizine (XYZAL) 5 MG  tablet    Sig: Take 1 tablet (5 mg total) by mouth every evening.    Dispense:  90 tablet    Refill:  3  . methylPREDNISolone (MEDROL DOSEPAK) 4 MG TBPK tablet    Sig: TAKE AS DIRECTED    Dispense:  21 tablet    Refill:  0  . nebivolol (BYSTOLIC) 5 MG tablet    Sig: Take 1 tablet (5 mg total) by mouth daily.    Dispense:  56 tablet    Refill:  0     Follow-up: Return in about 6 weeks (around 06/20/2017).  Scarlette Calico, MD

## 2017-05-09 NOTE — Patient Instructions (Signed)

## 2017-05-23 ENCOUNTER — Telehealth: Payer: Self-pay | Admitting: Neurology

## 2017-05-23 DIAGNOSIS — G4733 Obstructive sleep apnea (adult) (pediatric): Secondary | ICD-10-CM

## 2017-05-23 DIAGNOSIS — Z9989 Dependence on other enabling machines and devices: Principal | ICD-10-CM

## 2017-05-23 NOTE — Telephone Encounter (Signed)
Patient called office in reference to see if she is able to have her pressure increased on her CPAP machine.  Patient at 1-5.  Patient states she feels that she is not getting enough air.  Please call

## 2017-05-24 NOTE — Telephone Encounter (Signed)
I reviewed her AutoPap compliance data for the past 30 days, during which time she used her AutoPap 21 days with percent used days greater than 4 hours at 53% only, indicating suboptimal compliance, better in the past 2 weeks or so. Compliance data range from 04/24/2017 through 05/23/2017. Residual AHI at goal at 1.2 per hour, leak acceptable, in fact low, pressure for the 95th percentile at 7.4 cm, range of 5-10 cm. I would suggest we try switching her to a set pressure of 8 cm and see if she feels better using this versus a variable pressure. Please send order to DME and update patient.

## 2017-05-24 NOTE — Telephone Encounter (Signed)
I called the pt and advised her that Dr. Rexene Alberts recommends changing the cpap settings from 5-10 cm H2O to 8 cm H2O. Pt is agreeable to this. Will send order to [facility].

## 2017-05-24 NOTE — Addendum Note (Signed)
Addended by: Star Age on: 05/24/2017 05:07 PM   Modules accepted: Orders

## 2017-05-24 NOTE — Telephone Encounter (Signed)
Current settings are 5-10 cm H2O. Set up on 04/28/2017. Report pulled from [facility] for Dr. Rexene Alberts to review.

## 2017-05-29 DIAGNOSIS — G4733 Obstructive sleep apnea (adult) (pediatric): Secondary | ICD-10-CM | POA: Diagnosis not present

## 2017-06-12 IMAGING — DX DG ELBOW COMPLETE 3+V*R*
4 series · 4 of 4 positions shown · non-contrast
Comparison: None.

CLINICAL DATA: Anterior right elbow pain for 1 month. Pain with
extending arm. No known injury.

EXAM:
RIGHT ELBOW - COMPLETE 3+ VIEW

[elbow ap]
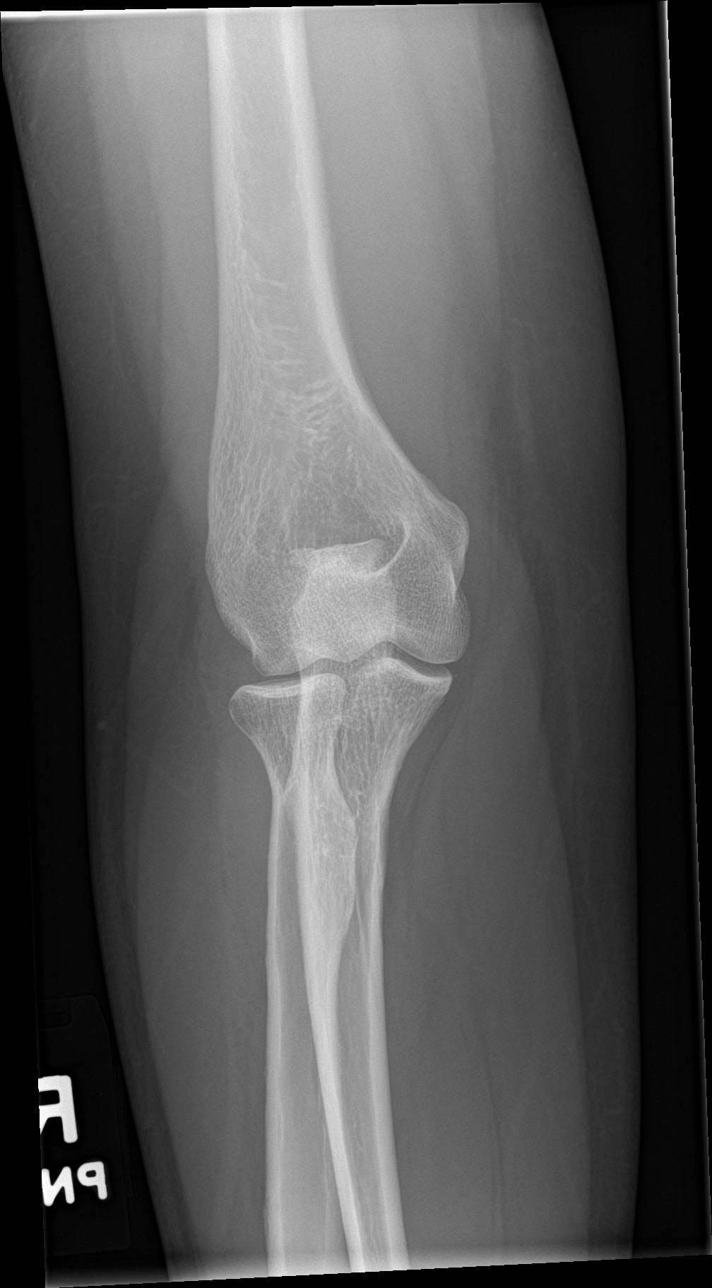

[elbow obl (1 of 2)]
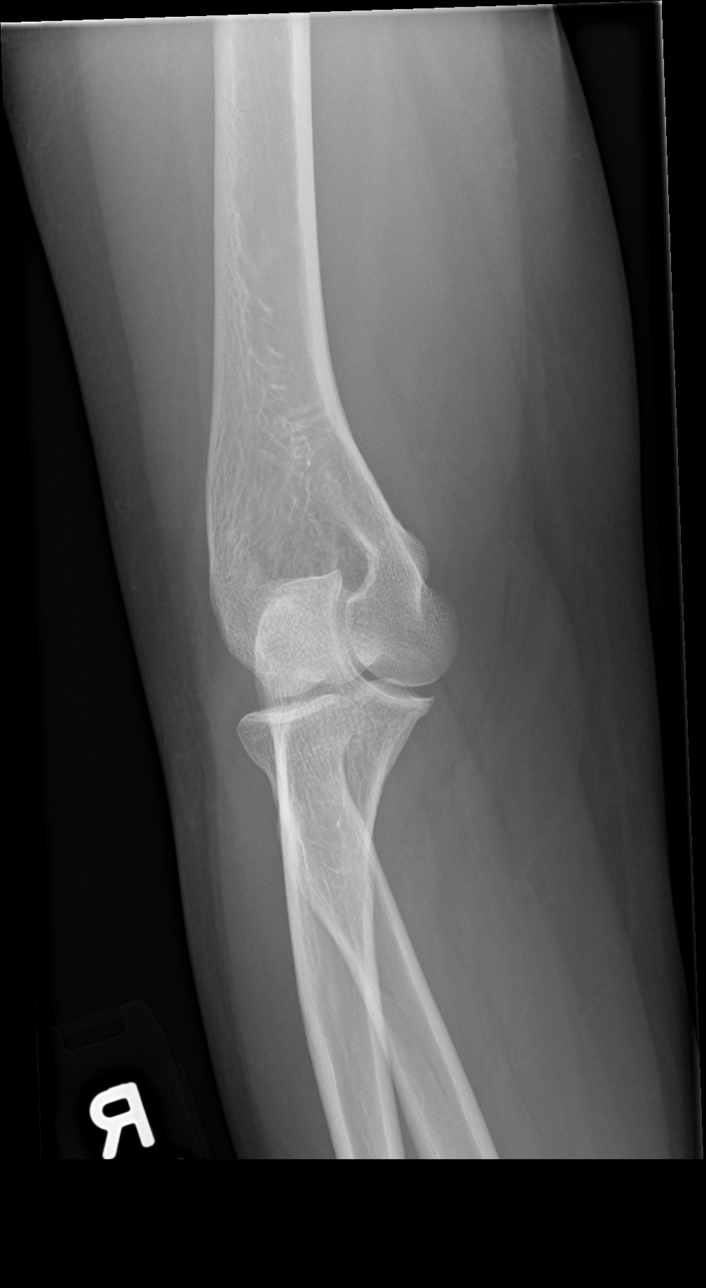

[elbow obl (2 of 2)]
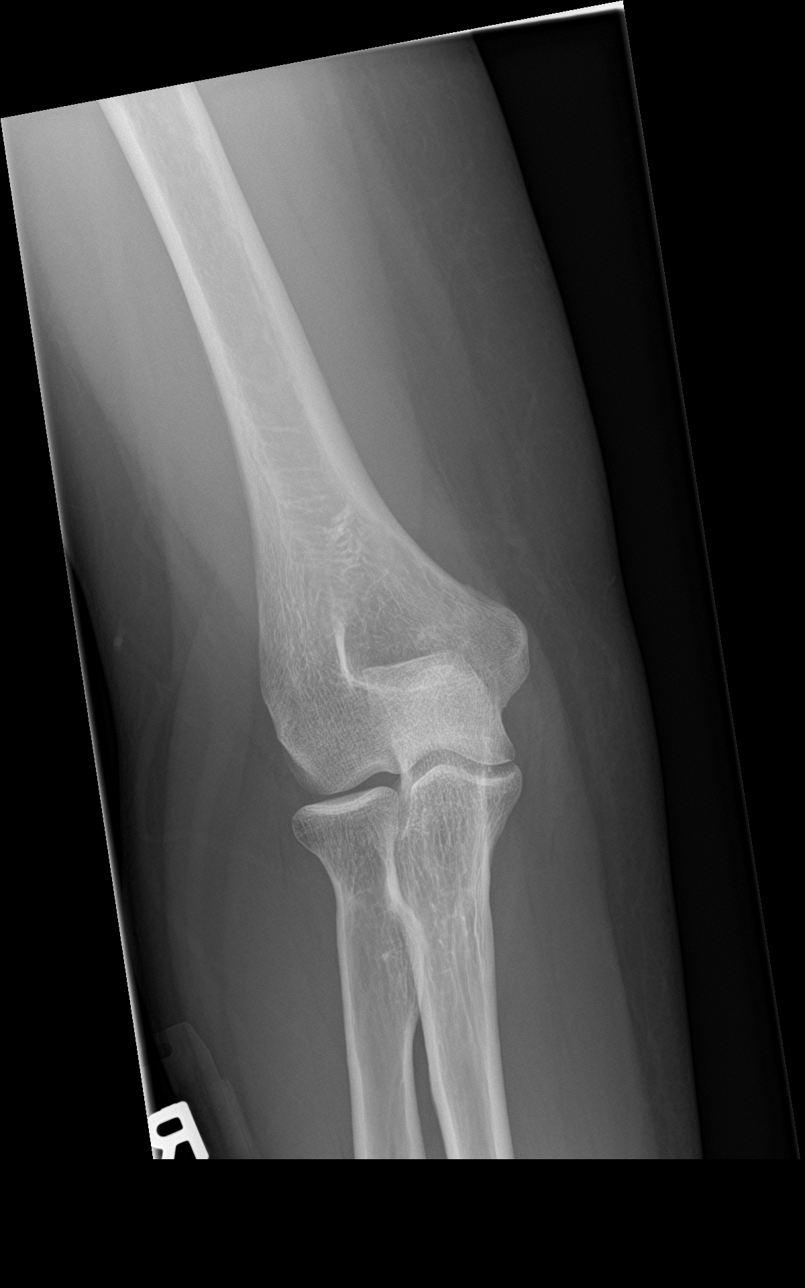

[elbow lat]
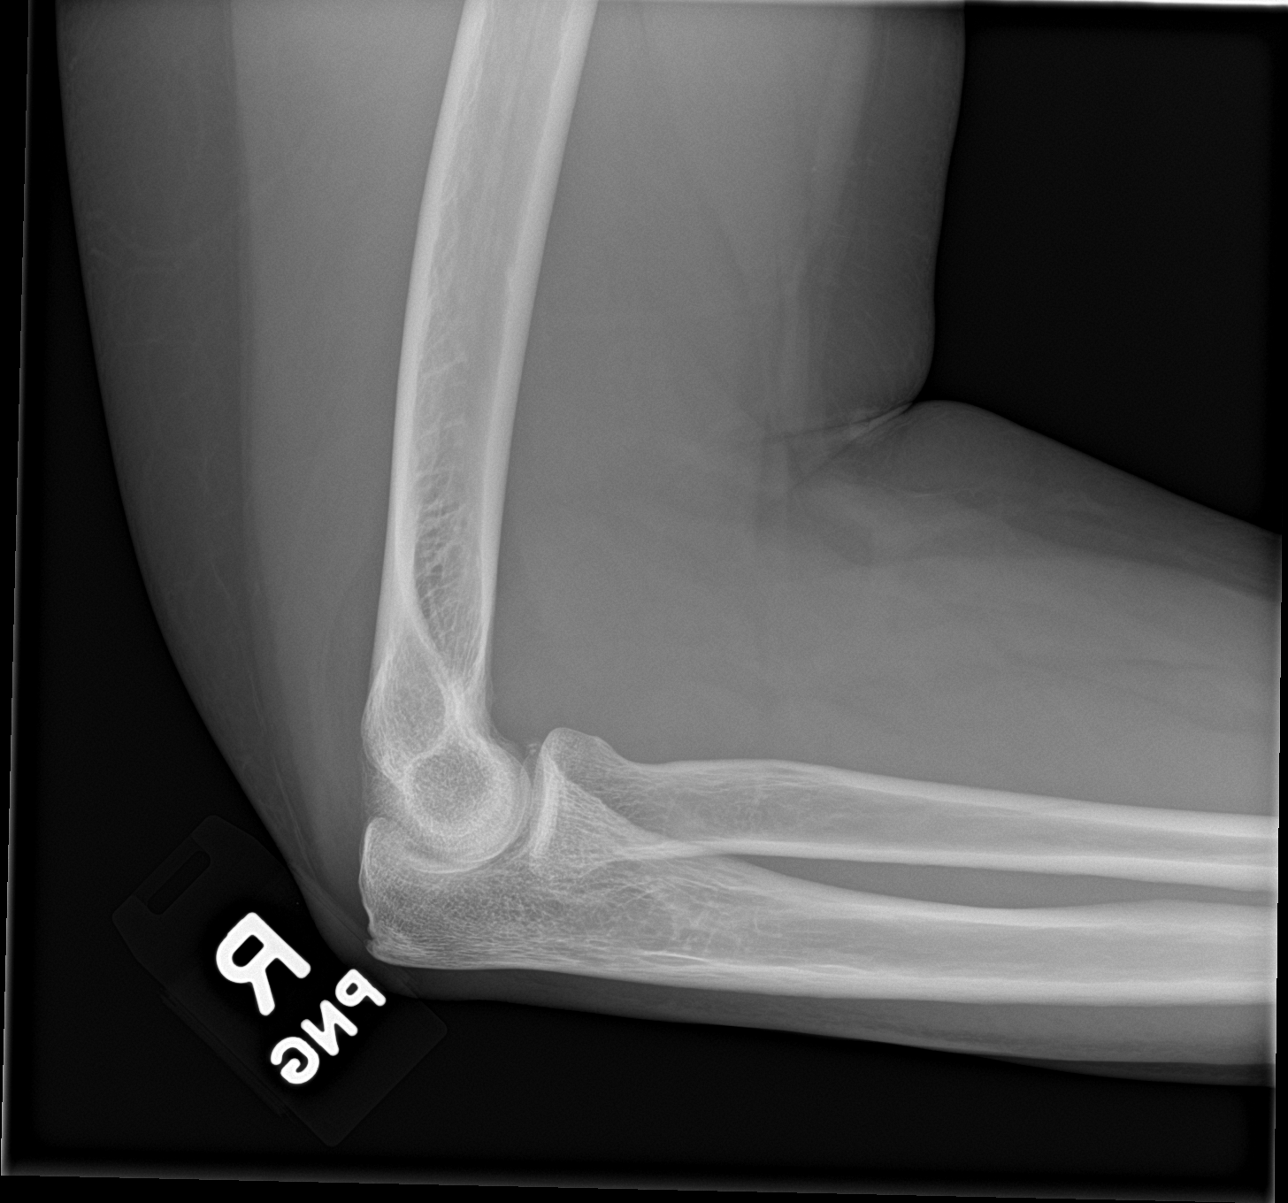

[4 of 4 positions shown; findings below may reference images not displayed]

FINDINGS: There is a right elbow joint effusion. No visible fracture. No
subluxation or dislocation. Soft tissues are intact.
IMPRESSION: Right elbow joint effusion. No visible fracture. If there is
clinical concern for occult fracture, consider immobilization and
repeat imaging.

## 2017-06-20 ENCOUNTER — Ambulatory Visit: Payer: 59 | Admitting: Internal Medicine

## 2017-06-21 DIAGNOSIS — Z01411 Encounter for gynecological examination (general) (routine) with abnormal findings: Secondary | ICD-10-CM | POA: Diagnosis not present

## 2017-06-21 DIAGNOSIS — Z1231 Encounter for screening mammogram for malignant neoplasm of breast: Secondary | ICD-10-CM | POA: Diagnosis not present

## 2017-06-21 LAB — HM PAP SMEAR

## 2017-06-28 DIAGNOSIS — G4733 Obstructive sleep apnea (adult) (pediatric): Secondary | ICD-10-CM | POA: Diagnosis not present

## 2017-07-04 ENCOUNTER — Encounter: Payer: Self-pay | Admitting: Neurology

## 2017-07-04 ENCOUNTER — Ambulatory Visit (INDEPENDENT_AMBULATORY_CARE_PROVIDER_SITE_OTHER): Payer: 59 | Admitting: Neurology

## 2017-07-04 VITALS — BP 110/70 | HR 50 | Ht 63.0 in | Wt 203.5 lb

## 2017-07-04 DIAGNOSIS — G4733 Obstructive sleep apnea (adult) (pediatric): Secondary | ICD-10-CM

## 2017-07-04 DIAGNOSIS — Z789 Other specified health status: Secondary | ICD-10-CM

## 2017-07-04 NOTE — Patient Instructions (Addendum)
I will refer you to your dentist for consideration of an oral appliance for treatment of your overall mild or borderline sleep apnea (OSA). If needed, we can send your referral to another practice. I will see you back as needed.   Please remember to try to maintain good sleep hygiene, which means: Keep a regular sleep and wake schedule, make enough time for sleep (7 to 8 hours), try not to exercise or have a meal within 2 hours of your bedtime, try to keep your bedroom conducive for sleep, that is, cool and dark, without light distractors such as an illuminated alarm clock, and refrain from watching TV right before sleep or in the middle of the night and do not keep the TV or radio on during the night. Also, try not to use or play on electronic devices at bedtime, such as your cell phone, tablet PC or laptop. If you like to read at bedtime on an electronic device, try to dim the background light as much as possible. Do not eat in the middle of the night.   Please also try to work on your weight loss and sleep on your sides.   I will see you back as needed. Please call your DME company about how to return the CPAP to them.

## 2017-07-04 NOTE — Progress Notes (Signed)
Subjective:    Patient ID: Samantha Ball is a 41 y.o. female.  HPI     Interim history:   Samantha Ball is a 41 year old right-handed woman with an underlying medical history of hypertension, iron deficiency anemia, hearing loss R ear, joint pain, and obesity, who presents for follow-up consultation of Samantha Ball obstructive sleep apnea, after recent home sleep testing. The patient is unaccompanied today. I first met Samantha Ball on 12/07/2017 at the request of Samantha Ball primary care physician, at which time she reported difficulty with sleep maintenance, morning headaches and daytime somnolence as well as snoring. I suggested she return for a sleep study. Samantha Ball insurance denied and attended sleep study and she had a home sleep test on 04/06/2017 indicating borderline obstructive sleep apnea with an AHI of 5.9 per hour, O2 nadir of 89%. I suggested she start AutoPAP therapy, which she started in April 2018. She called back in June reporting that the pressure did not feel enough. I suggested we switch Samantha Ball from AutoPap to a CPAP of 8 cm.  Today, 07/04/2017: I reviewed Samantha Ball CPAP compliance data from 05/30/2017 through 06/28/2017, which is a total of 30 days, during which time she used Samantha Ball CPAP every night, with percent used days greater than 4 hours at 77%, indicating adequate compliance with an average usage of 4 hours and 42 minutes, residual AHI 1.3 per hour, leak acceptable for the 95th percentile at 13 L/m on a pressure of 8 cm with EPR of 3. She reports having difficulty with the CPAP, no telltale improvements noted, struggling practically on a nightly bases. Maybe some improvements in the HAs. But not able to maintain CPAP at this point. Has some knee pain, mostly stiff in AMs, no significant pain at night. Takes Naproxen prn.  The patient's allergies, current medications, family history, past medical history, past social history, past surgical history and problem list were reviewed and updated as appropriate.   Previously  (copied from previous notes for reference):   12/07/2016: (She) reports difficulty with Samantha Ball sleep including sleep maintenance issues, snoring, morning HAs and daytime somnolence. I reviewed your office note from 11/09/2016. She recently tried Belsomra for about a week, but felt too groggy the next day, was given a Rx for lunesta but did not fill it for fear of similar SEs.  She snores, which per family can be loud, has had morning headaches, has nocturia 1-2 per night. She is not aware of any FHx of OSA.  Samantha Ball Epworth sleepiness score is 17 out of 24 today, Samantha Ball fatigue score is 47 out of 63. She lives at home with Samantha Ball husband and 2 children. She is a nonsmoker, does not drink alcohol, does not use illicit drugs, she drinks caffeine in the form of soda, typically [facility], 1 can per day and one glass of tea per day. Weight has increased in the last 6 months. She has had EDS for a few years and snoring, but worse in the last 6 months. She has occasional RLS symptoms and moves Samantha Ball legs at night.  She works an Marketing executive job, daytime hours, bedtime is between 10:30 and 11 and wake up time at 5:30.   Samantha Ball Past Medical History Is Significant For: Past Medical History:  Diagnosis Date  . Hyperlipidemia   . Hypertension     Samantha Ball Past Surgical History Is Significant For: Past Surgical History:  Procedure Laterality Date  . TUBAL LIGATION    . wisdom teeth  2000    Samantha Ball Family  History Is Significant For: Family History  Problem Relation Age of Onset  . Heart disease Father   . Cancer Neg Hx   . Alcohol abuse Neg Hx   . COPD Neg Hx   . Diabetes Neg Hx   . Depression Neg Hx   . Drug abuse Neg Hx   . Early death Neg Hx   . Hearing loss Neg Hx   . Hyperlipidemia Neg Hx   . Hypertension Neg Hx   . Kidney disease Neg Hx   . Stroke Neg Hx     Samantha Ball Social History Is Significant For: Social History   Social History  . Marital status: Married    Spouse name: N/A  . Number of children: N/A  .  Years of education: N/A   Social History Main Topics  . Smoking status: Never Smoker  . Smokeless tobacco: Never Used  . Alcohol use No  . Drug use: No  . Sexual activity: Yes    Birth control/ protection: Surgical     Comment: BTL   Other Topics Concern  . None   Social History Narrative  . None    Samantha Ball Allergies Are:  Allergies  Allergen Reactions  . Amlodipine     constipation  :   Samantha Ball Current Medications Are:  Outpatient Encounter Prescriptions as of 07/04/2017  Medication Sig  . chlorthalidone (HYGROTON) 25 MG tablet Take 1 tablet (25 mg total) by mouth daily.  Marland Kitchen esomeprazole (NEXIUM) 40 MG capsule Take 1 capsule (40 mg total) by mouth daily at 12 noon.  Marland Kitchen levocetirizine (XYZAL) 5 MG tablet Take 1 tablet (5 mg total) by mouth every evening.  . naproxen (NAPROSYN) 250 MG tablet Take by mouth 2 (two) times daily with a meal.  . nebivolol (BYSTOLIC) 5 MG tablet Take 1 tablet (5 mg total) by mouth daily.  . [DISCONTINUED] methylPREDNISolone (MEDROL DOSEPAK) 4 MG TBPK tablet TAKE AS DIRECTED   No facility-administered encounter medications on file as of 07/04/2017.   :  Review of Systems:  Out of a complete 14 point review of systems, all are reviewed and negative with the exception of these symptoms as listed below:  Review of Systems  Neurological:       Pt reports CPAP usage not going well. Whenever she rolls over, mask will fall off. Not able to wear consistently.     Objective:  Neurological Exam  Physical Exam Physical Examination:   Vitals:   07/04/17 1300  BP: 110/70  Pulse: (!) 50    General Examination: The patient is a very pleasant 41 y.o. female in no acute distress. She appears well-developed and well-nourished and well groomed.   HEENT: Normocephalic, atraumatic, pupils are equal, round and reactive to light and accommodation. Extraocular tracking is good without limitation to gaze excursion or nystagmus noted. Normal smooth pursuit is noted.  Hearing is grossly intact with R hearing aid in place. Face is symmetric with normal facial animation and normal facial sensation. Speech is clear with no dysarthria noted. There is no hypophonia. There is no lip, neck/head, jaw or voice tremor. Neck is supple with full range of passive and active motion. There are no carotid bruits on auscultation. Oropharynx exam reveals: mild mouth dryness, adequate dental hygiene and moderate airway crowding. Mallampati is class II. Tongue protrudes centrally and palate elevates symmetrically. She has a Mild to moderate overbite.   Chest: Clear to auscultation without wheezing, rhonchi or crackles noted.  Heart: S1+S2+0, regular and normal without  murmurs, rubs or gallops noted.   Abdomen: Soft, non-tender and non-distended with normal bowel sounds appreciated on auscultation.  Extremities: There is no pitting edema in the distal lower extremities bilaterally.   Skin: Warm and dry without trophic changes noted.  Musculoskeletal: exam reveals no obvious joint deformities, tenderness or joint swelling or erythema.   Neurologically:  Mental status: The patient is awake, alert and oriented in all 4 spheres. Samantha Ball immediate and remote memory, attention, language skills and fund of knowledge are appropriate. There is no evidence of aphasia, agnosia, apraxia or anomia. Speech is clear with normal prosody and enunciation. Thought process is linear. Mood is normal and affect is normal.  Cranial nerves II - XII are as described above under HEENT exam. In addition: shoulder shrug is normal with equal shoulder height noted. Motor exam: Normal bulk, strength and tone is noted. There is no drift, tremor or rebound. Romberg is negative. Reflexes are 1-2+ throughout. Fine motor skills and coordination: intact with normal finger taps, normal hand movements, normal rapid alternating patting, normal foot taps and normal foot agility.  Cerebellar testing: No dysmetria or  intention tremor on finger to nose testing. Heel to shin is unremarkable bilaterally. There is no truncal or gait ataxia.  Sensory exam: intact to light touch in the upper and lower extremities.  Gait, station and balance: She stands easily. No veering to one side is noted. No leaning to one side is noted. Posture is age-appropriate and stance is narrow based. Gait shows normal stride length and normal pace. No problems turning are noted. Tandem walk is unremarkable.    Assessment and Plan:  In summary, Samantha Ball is a very pleasant 41 year old female with an underlying medical history of hypertension, iron deficiency anemia, hearing loss R ear, joint pain, and obesity, who presents for follow-up consultation of Samantha Ball OSA. She had a home sleep test in April 2018 which indicated mild borderline OSA with an AHI of 5.9 and O2 nadir of 89%. She is advised that sometimes home sleep test results underestimate sleep disordered breathing but it is more likely than not that she has sleep apnea in the mild range. For this, I suggested she try AutoPap which she had difficulty tolerating and we switched Samantha Ball to CPAP and while she is compliant with that she is not noticing any benefit and is struggling with it on a night to night basis, difficulty with mask and also the pressure, has not noticed much in the way of improvement in Samantha Ball sleep quality, daytime symptoms, perhaps headaches a little better but overall, she is not able to tolerate CPAP in the long run. She is advised Samantha Ball to pursue weight loss and try to sleep on Samantha Ball sides. In addition, she is advised to look into getting fitted for an oral appliance. I placed a referral to Samantha Ball dentist for this, if needed, we can send a referral to another practice that does provide dental devices for sleep apnea and snoring treatment. She's agreeable to this. Samantha Ball physical exam is stable. From my end of things I suggested she maintain good sleep hygiene, allow for enough sleep  time, try to pursue weight loss and sleep on Samantha Ball sides. I will see Samantha Ball back on an as-needed basis. She is in agreement. I answered all Samantha Ball questions today. I spent 25 minutes in total face-to-face time with the patient, more than 50% of which was spent in counseling and coordination of care, reviewing test results, reviewing medication and discussing  or reviewing the diagnosis of OSA, its prognosis and treatment options. Pertinent laboratory and imaging test results that were available during this visit with the patient were reviewed by me and considered in my medical decision making (see chart for details).

## 2017-07-09 ENCOUNTER — Other Ambulatory Visit: Payer: Self-pay | Admitting: Internal Medicine

## 2017-07-22 ENCOUNTER — Other Ambulatory Visit: Payer: Self-pay | Admitting: Internal Medicine

## 2017-10-04 ENCOUNTER — Other Ambulatory Visit: Payer: Self-pay | Admitting: Internal Medicine

## 2017-10-04 DIAGNOSIS — I10 Essential (primary) hypertension: Secondary | ICD-10-CM

## 2018-01-18 ENCOUNTER — Encounter: Payer: Self-pay | Admitting: Internal Medicine

## 2018-01-18 ENCOUNTER — Ambulatory Visit: Payer: 59 | Admitting: Internal Medicine

## 2018-01-18 ENCOUNTER — Other Ambulatory Visit (INDEPENDENT_AMBULATORY_CARE_PROVIDER_SITE_OTHER): Payer: 59

## 2018-01-18 VITALS — BP 130/80 | HR 74 | Temp 98.3°F | Resp 16 | Ht 63.0 in | Wt 204.0 lb

## 2018-01-18 DIAGNOSIS — T502X5A Adverse effect of carbonic-anhydrase inhibitors, benzothiadiazides and other diuretics, initial encounter: Secondary | ICD-10-CM

## 2018-01-18 DIAGNOSIS — R202 Paresthesia of skin: Secondary | ICD-10-CM

## 2018-01-18 DIAGNOSIS — R7303 Prediabetes: Secondary | ICD-10-CM | POA: Insufficient documentation

## 2018-01-18 DIAGNOSIS — I10 Essential (primary) hypertension: Secondary | ICD-10-CM

## 2018-01-18 DIAGNOSIS — R200 Anesthesia of skin: Secondary | ICD-10-CM | POA: Diagnosis not present

## 2018-01-18 DIAGNOSIS — R739 Hyperglycemia, unspecified: Secondary | ICD-10-CM

## 2018-01-18 DIAGNOSIS — E876 Hypokalemia: Secondary | ICD-10-CM

## 2018-01-18 DIAGNOSIS — L500 Allergic urticaria: Secondary | ICD-10-CM | POA: Insufficient documentation

## 2018-01-18 DIAGNOSIS — Z23 Encounter for immunization: Secondary | ICD-10-CM

## 2018-01-18 DIAGNOSIS — L508 Other urticaria: Secondary | ICD-10-CM

## 2018-01-18 DIAGNOSIS — K5900 Constipation, unspecified: Secondary | ICD-10-CM

## 2018-01-18 LAB — CBC WITH DIFFERENTIAL/PLATELET
Basophils Absolute: 0.1 10*3/uL (ref 0.0–0.1)
Basophils Relative: 0.8 % (ref 0.0–3.0)
Eosinophils Absolute: 0.1 10*3/uL (ref 0.0–0.7)
Eosinophils Relative: 1.9 % (ref 0.0–5.0)
HCT: 34.1 % — ABNORMAL LOW (ref 36.0–46.0)
Hemoglobin: 11.2 g/dL — ABNORMAL LOW (ref 12.0–15.0)
Lymphocytes Relative: 43.5 % (ref 12.0–46.0)
Lymphs Abs: 3 10*3/uL (ref 0.7–4.0)
MCHC: 32.7 g/dL (ref 30.0–36.0)
MCV: 72.2 fl — ABNORMAL LOW (ref 78.0–100.0)
Monocytes Absolute: 0.5 10*3/uL (ref 0.1–1.0)
Monocytes Relative: 6.7 % (ref 3.0–12.0)
Neutro Abs: 3.2 10*3/uL (ref 1.4–7.7)
Neutrophils Relative %: 47.1 % (ref 43.0–77.0)
Platelets: 326 10*3/uL (ref 150.0–400.0)
RBC: 4.73 Mil/uL (ref 3.87–5.11)
RDW: 18.3 % — ABNORMAL HIGH (ref 11.5–15.5)
WBC: 6.8 10*3/uL (ref 4.0–10.5)

## 2018-01-18 LAB — COMPREHENSIVE METABOLIC PANEL
ALT: 16 U/L (ref 0–35)
AST: 16 U/L (ref 0–37)
Albumin: 4.2 g/dL (ref 3.5–5.2)
Alkaline Phosphatase: 44 U/L (ref 39–117)
BUN: 10 mg/dL (ref 6–23)
CO2: 32 mEq/L (ref 19–32)
Calcium: 9.8 mg/dL (ref 8.4–10.5)
Chloride: 96 mEq/L (ref 96–112)
Creatinine, Ser: 0.84 mg/dL (ref 0.40–1.20)
GFR: 95.56 mL/min (ref >60.00)
Glucose, Bld: 97 mg/dL (ref 70–99)
Potassium: 2.4 mEq/L — CL (ref 3.5–5.1)
Sodium: 138 mEq/L (ref 135–145)
Total Bilirubin: 0.4 mg/dL (ref 0.2–1.2)
Total Protein: 8 g/dL (ref 6.0–8.3)

## 2018-01-18 LAB — THYROID PANEL WITH TSH
Free Thyroxine Index: 2.8 (ref 1.4–3.8)
T3 Uptake: 26 % (ref 22–35)
T4, Total: 10.6 ug/dL (ref 5.1–11.9)
TSH: 1.84 mIU/L

## 2018-01-18 LAB — CK: Total CK: 131 U/L (ref 7–177)

## 2018-01-18 LAB — HEMOGLOBIN A1C: Hgb A1c MFr Bld: 6.2 % (ref 4.6–6.5)

## 2018-01-18 LAB — SEDIMENTATION RATE: Sed Rate: 19 mm/h (ref 0–20)

## 2018-01-18 LAB — C-REACTIVE PROTEIN: CRP: 0.6 mg/dL (ref 0.5–20.0)

## 2018-01-18 LAB — VITAMIN B12: Vitamin B-12: 230 pg/mL (ref 211–911)

## 2018-01-18 MED ORDER — DOXEPIN HCL 25 MG PO CAPS: 25.0000 mg | ORAL_CAPSULE | Freq: Every day | ORAL | 1 refills | Status: DC

## 2018-01-18 MED ORDER — POTASSIUM CHLORIDE ER 10 MEQ PO TBCR: 10.0000 meq | EXTENDED_RELEASE_TABLET | Freq: Three times a day (TID) | ORAL | 1 refills | Status: DC

## 2018-01-18 MED ORDER — SPIRONOLACTONE 25 MG PO TABS: 25.0000 mg | ORAL_TABLET | Freq: Every day | ORAL | 1 refills | Status: DC

## 2018-01-18 NOTE — Patient Instructions (Signed)
Hives Hives (urticaria) are itchy, red, swollen areas on your skin. Hives can show up on any part of your body, and they can vary in size. They can be as small as the tip of a pen or much larger. Hives often fade within 24 hours (acute hives). In other cases, new hives show up after old ones fade. This can continue for many days or weeks (chronic hives). Hives are caused by your body's reaction to an irritant or to something that you are allergic to (trigger). You can get hives right after being around a trigger or hours later. Hives do not spread from person to person (are not contagious). Hives may get worse if you scratch them, if you exercise, or if you have worries (emotional stress). Follow these instructions at home: Medicines  Take or apply over-the-counter and prescription medicines only as told by your doctor.  If you were prescribed an antibiotic medicine, use it as told by your doctor. Do not stop taking the antibiotic even if you start to feel better. Skin Care  Apply cool, wet cloths (cool compresses) to the itchy, red, swollen areas.  Do not scratch your skin. Do not rub your skin. General instructions  Do not take hot showers or baths. This can make itching worse.  Do not wear tight clothes.  Use sunscreen and wear clothing that covers your skin when you are outside.  Avoid any triggers that cause your hives. Keep a journal to help you keep track of what causes your hives. Write down: ? What medicines you take. ? What you eat and drink. ? What products you use on your skin.  Keep all follow-up visits as told by your doctor. This is important. Contact a doctor if:  Your symptoms are not better with medicine.  Your joints are painful or swollen. Get help right away if:  You have a fever.  You have belly pain.  Your tongue or lips are swollen.  Your eyelids are swollen.  Your chest or throat feels tight.  You have trouble breathing or swallowing. These  symptoms may be an emergency. Do not wait to see if the symptoms will go away. Get medical help right away. Call your local emergency services (911 in the [place] Do not drive yourself to the hospital. This information is not intended to replace advice given to you by your health care provider. Make sure you discuss any questions you have with your health care provider. Document Released: 09/14/2008 Document Revised: 05/13/2016 Document Reviewed: 09/24/2015 Elsevier Interactive Patient Education  2018 Elsevier Inc.  

## 2018-01-18 NOTE — Progress Notes (Signed)
Subjective:  Patient ID: Samantha Ball, female    DOB: 08-11-1976  Age: 42 y.o. MRN: [id number]  CC: Hypertension; Rash; and Anemia   HPI Samantha Ball presents for f/up - She complains of a one-month history of intermittent episodes of itchy wheals that occur on her face, torso, and arms.  She gets some symptom relief with Benadryl.  She takes over-the-counter NSAIDs but does not drink alcohol.  She also complains of chronic, intermittent constipation and wants of follow-up with her gastroenterologist at [place].  Additionally, she complains of a several month history of worsening numbness and tingling with shooting pain in both thighs.  She denies low back pain.  She states that none of the symptoms radiate below the knee.  Outpatient Medications Prior to Visit  Medication Sig Dispense Refill  . esomeprazole (NEXIUM) 40 MG capsule Take 1 capsule (40 mg total) by mouth daily at 12 noon. 90 capsule 3  . chlorthalidone (HYGROTON) 25 MG tablet TAKE 1 TABLET BY MOUTH EVERY DAY 90 tablet 0  . levocetirizine (XYZAL) 5 MG tablet Take 1 tablet (5 mg total) by mouth every evening. 90 tablet 3  . naproxen (NAPROSYN) 250 MG tablet Take by mouth 2 (two) times daily with a meal.    . naproxen (NAPROSYN) 375 MG tablet TAKE 1 TABLET BY MOUTH TWICE A DAY WITH A MEAL 60 tablet 1  . esomeprazole (NEXIUM) 40 MG capsule TAKE ONE CAPSULE BY MOUTH EVERY DAY 90 capsule 1  . nebivolol (BYSTOLIC) 5 MG tablet Take 1 tablet (5 mg total) by mouth daily. 56 tablet 0   No facility-administered medications prior to visit.     ROS Review of Systems  Constitutional: Negative.  Negative for activity change, appetite change, chills, diaphoresis, fatigue, fever and unexpected weight change.  HENT: Negative.  Negative for trouble swallowing.   Respiratory: Negative for cough, chest tightness, shortness of breath and wheezing.   Cardiovascular: Negative for chest pain, palpitations and leg swelling.    Gastrointestinal: Positive for constipation. Negative for abdominal pain, diarrhea, nausea and vomiting.  Endocrine: Negative.   Genitourinary: Negative.  Negative for difficulty urinating.  Musculoskeletal: Positive for arthralgias and myalgias. Negative for back pain, gait problem, joint swelling, neck pain and neck stiffness.  Skin: Positive for rash. Negative for color change and pallor.  Allergic/Immunologic: Negative.   Neurological: Positive for numbness. Negative for dizziness, tremors and weakness.  Hematological: Negative for adenopathy. Does not bruise/bleed easily.  Psychiatric/Behavioral: Negative.     Objective:  BP 130/80 (BP Location: Left Arm, Patient Position: Sitting, Cuff Size: Large)   Pulse 74   Temp 98.3 F (36.8 C) (Oral)   Resp 16   Ht 5\' 3"  (1.6 m)   Wt 204 lb (92.5 kg)   LMP 01/17/2018   SpO2 98%   BMI 36.14 kg/m   BP Readings from Last 3 Encounters:  01/18/18 130/80  07/04/17 110/70  05/09/17 (!) 130/100    Wt Readings from Last 3 Encounters:  01/18/18 204 lb (92.5 kg)  07/04/17 203 lb 8 oz (92.3 kg)  05/09/17 200 lb 8 oz (90.9 kg)    Physical Exam  Constitutional: She is oriented to person, place, and time.  Non-toxic appearance. She does not have a sickly appearance. She does not appear ill. No distress.  HENT:  Mouth/Throat: Oropharynx is clear and moist. No oropharyngeal exudate.  Eyes: Conjunctivae are normal. Left eye exhibits no discharge. No scleral icterus.  Neck: Normal range of motion.  Neck supple. No JVD present. No thyromegaly present.  Cardiovascular: Normal rate, regular rhythm and normal heart sounds. Exam reveals no gallop.  No murmur heard. Pulses:      Carotid pulses are 1+ on the right side, and 1+ on the left side.      Radial pulses are 1+ on the right side, and 1+ on the left side.       Femoral pulses are 1+ on the right side, and 1+ on the left side.      Popliteal pulses are 1+ on the right side, and 1+ on the  left side.       Dorsalis pedis pulses are 1+ on the right side, and 1+ on the left side.       Posterior tibial pulses are 1+ on the right side, and 1+ on the left side.  Pulmonary/Chest: Effort normal and breath sounds normal. No respiratory distress. She has no wheezes. She has no rales.  Abdominal: Soft. Bowel sounds are normal. She exhibits no distension and no mass. There is no tenderness. There is no guarding.  Musculoskeletal: Normal range of motion. She exhibits no edema, tenderness or deformity.  Lymphadenopathy:    She has no cervical adenopathy.  Neurological: She is alert and oriented to person, place, and time. She has normal strength. She displays no atrophy, no tremor and normal reflexes. No cranial nerve deficit or sensory deficit. She exhibits normal muscle tone. She displays a negative Romberg sign. She displays no seizure activity. Coordination and gait normal.  Reflex Scores:      Tricep reflexes are 1+ on the right side and 1+ on the left side.      Bicep reflexes are 1+ on the right side and 1+ on the left side.      Brachioradialis reflexes are 1+ on the right side and 1+ on the left side.      Patellar reflexes are 1+ on the right side and 1+ on the left side.      Achilles reflexes are 1+ on the right side and 1+ on the left side. Skin: Skin is warm and dry. Rash noted. Rash is urticarial. She is not diaphoretic. No erythema. No pallor.  There are blanching, urticarial wheals on the temples of her face and both upper extremities on the dorsum of the forearms.  There are no targets, there is no involvement of the palms or soles, and there is no involvement of the mucous membranes of the eyes of the mouth.  Psychiatric: She has a normal mood and affect. Her behavior is normal. Judgment and thought content normal.  Vitals reviewed.   Lab Results  Component Value Date   WBC 6.8 01/18/2018   HGB 11.2 (L) 01/18/2018   HCT 34.1 (L) 01/18/2018   PLT 326.0 01/18/2018    GLUCOSE 97 01/18/2018   CHOL 155 12/21/2016   TRIG 224.0 (H) 12/21/2016   HDL 32.50 (L) 12/21/2016   LDLDIRECT 99.0 12/21/2016   LDLCALC 115 (H) 07/11/2013   ALT 16 01/18/2018   AST 16 01/18/2018   NA 138 01/18/2018   K 2.4 (LL) 01/18/2018   CL 96 01/18/2018   CREATININE 0.84 01/18/2018   BUN 10 01/18/2018   CO2 32 01/18/2018   TSH 1.84 01/18/2018   HGBA1C 6.2 01/18/2018    Dg Chest 2 View  Result Date: 04/04/2017 CLINICAL DATA:  Cough, congestion and dyspnea for 1 week. EXAM: CHEST  2 VIEW COMPARISON:  None. FINDINGS: Cardiomediastinal silhouette is  normal. No pleural effusions or focal consolidations. Trachea projects midline and there is no pneumothorax. Soft tissue planes and included osseous structures are non-suspicious. IMPRESSION: Normal chest. Electronically Signed   By: Elon Alas M.D.   On: 04/04/2017 16:19    Assessment & Plan:   Alette was seen today for hypertension, rash and anemia.  Diagnoses and all orders for this visit:  Constipation, unspecified constipation type- Her labs are negative for any secondary causes. -     Comprehensive metabolic panel; Future -     Ambulatory referral to Gastroenterology -     Thyroid Panel With TSH; Future  Essential hypertension, benign- Her blood pressure is well controlled but she is developed severe hypokalemia.  Will discontinue the thiazide diuretic and will change to a potassium sparing diuretic to stabilize her potassium level. -     CBC with Differential/Platelet; Future -     spironolactone (ALDACTONE) 25 MG tablet; Take 1 tablet (25 mg total) by mouth daily.  Urticaria, chronic- Her lab work is negative for any secondary causes or evidence of inflammation or vasculitis.  The only medication that she takes and I am concerned about are the thiazide and the anti-inflammatory so have asked her to stop taking both of those.  Will treat with a powerful, once a day antihistamine. -     doxepin (SINEQUAN) 25 MG  capsule; Take 1 capsule (25 mg total) by mouth at bedtime. -     Cancel: Sedimentation rate; Future -     C-reactive protein; Future -     Sedimentation rate; Future  Numbness and tingling of both legs- Her symptoms may be related to the hypokalemia.  Will screen for B12 deficiency.  Lab tests for myopathy and inflammation are negative.  If the symptoms do not resolve soon then I may consider ordering a NCS/EMG. -     Vitamin B12; Future -     CK; Future -     Cancel: Sedimentation rate; Future -     C-reactive protein; Future -     Sedimentation rate; Future  Hyperglycemia - Her A1c is up to 6.2%.  Medical therapy is not indicated.  She agrees to work on her lifestyle modifications. -     Hemoglobin A1c; Future  Need for influenza vaccination -     Flu Vaccine QUAD 6+ mos PF IM (Fluarix Quad PF)  Diuretic-induced hypokalemia -     potassium chloride (K-DUR) 10 MEQ tablet; Take 1 tablet (10 mEq total) by mouth 3 (three) times daily. -     spironolactone (ALDACTONE) 25 MG tablet; Take 1 tablet (25 mg total) by mouth daily.   I have discontinued Pollyann Kennedy. Nan's naproxen, levocetirizine, nebivolol, naproxen, and chlorthalidone. I am also having her start on doxepin, potassium chloride, and spironolactone. Additionally, I am having her maintain her esomeprazole.  Meds ordered this encounter  Medications  . doxepin (SINEQUAN) 25 MG capsule    Sig: Take 1 capsule (25 mg total) by mouth at bedtime.    Dispense:  90 capsule    Refill:  1  . potassium chloride (K-DUR) 10 MEQ tablet    Sig: Take 1 tablet (10 mEq total) by mouth 3 (three) times daily.    Dispense:  90 tablet    Refill:  1  . spironolactone (ALDACTONE) 25 MG tablet    Sig: Take 1 tablet (25 mg total) by mouth daily.    Dispense:  30 tablet    Refill:  1  Follow-up: Return in about 4 weeks (around 02/15/2018).  Scarlette Calico, MD

## 2018-01-19 ENCOUNTER — Telehealth: Payer: Self-pay

## 2018-01-19 ENCOUNTER — Ambulatory Visit (INDEPENDENT_AMBULATORY_CARE_PROVIDER_SITE_OTHER): Payer: 59

## 2018-01-19 DIAGNOSIS — E538 Deficiency of other specified B group vitamins: Secondary | ICD-10-CM | POA: Diagnosis not present

## 2018-01-19 MED ORDER — CYANOCOBALAMIN 1000 MCG/ML IJ SOLN
1000.0000 ug | Freq: Once | INTRAMUSCULAR | Status: AC
  Administered 2018-01-19: 1000 ug via INTRAMUSCULAR

## 2018-01-19 NOTE — Telephone Encounter (Signed)
Patient came in for first b12 injection (recent lab shows anemia)---however, lab result note doesn't state how often/duration of b12 injections---routing to dr Ronnald Ramp, please advise how often you would like patient to come and when you would like a follow up---I will call patient back, thanks

## 2018-01-19 NOTE — Telephone Encounter (Signed)
Once a month

## 2018-01-27 ENCOUNTER — Encounter: Payer: Self-pay | Admitting: Gastroenterology

## 2018-01-30 ENCOUNTER — Other Ambulatory Visit: Payer: Self-pay | Admitting: Internal Medicine

## 2018-01-30 ENCOUNTER — Telehealth: Payer: Self-pay | Admitting: Internal Medicine

## 2018-01-30 DIAGNOSIS — L508 Other urticaria: Secondary | ICD-10-CM

## 2018-01-30 NOTE — Telephone Encounter (Signed)
Referral ordered

## 2018-01-30 NOTE — Telephone Encounter (Signed)
Copied from [place]. Topic: Quick Communication - See Telephone Encounter >> Jan 30, 2018  1:34 PM Aurelio Brash B wrote: CRM for notification. See Telephone encounter for:  PT  would like Dr Ronnald Ramp to know her hives are back and that she is getting headaches. 01/30/18.

## 2018-02-15 DIAGNOSIS — B351 Tinea unguium: Secondary | ICD-10-CM | POA: Diagnosis not present

## 2018-02-15 DIAGNOSIS — L918 Other hypertrophic disorders of the skin: Secondary | ICD-10-CM | POA: Diagnosis not present

## 2018-02-15 DIAGNOSIS — L608 Other nail disorders: Secondary | ICD-10-CM | POA: Diagnosis not present

## 2018-02-16 ENCOUNTER — Ambulatory Visit: Payer: 59 | Admitting: Internal Medicine

## 2018-02-16 ENCOUNTER — Other Ambulatory Visit (INDEPENDENT_AMBULATORY_CARE_PROVIDER_SITE_OTHER): Payer: 59

## 2018-02-16 ENCOUNTER — Encounter: Payer: Self-pay | Admitting: Internal Medicine

## 2018-02-16 VITALS — BP 124/80 | HR 96 | Temp 97.9°F | Resp 16 | Ht 63.0 in | Wt 204.0 lb

## 2018-02-16 DIAGNOSIS — E876 Hypokalemia: Secondary | ICD-10-CM | POA: Diagnosis not present

## 2018-02-16 DIAGNOSIS — E538 Deficiency of other specified B group vitamins: Secondary | ICD-10-CM

## 2018-02-16 DIAGNOSIS — T502X5A Adverse effect of carbonic-anhydrase inhibitors, benzothiadiazides and other diuretics, initial encounter: Secondary | ICD-10-CM

## 2018-02-16 DIAGNOSIS — J301 Allergic rhinitis due to pollen: Secondary | ICD-10-CM

## 2018-02-16 DIAGNOSIS — L508 Other urticaria: Secondary | ICD-10-CM | POA: Diagnosis not present

## 2018-02-16 DIAGNOSIS — D508 Other iron deficiency anemias: Secondary | ICD-10-CM

## 2018-02-16 LAB — CBC WITH DIFFERENTIAL/PLATELET
Basophils Absolute: 0.1 10*3/uL (ref 0.0–0.1)
Basophils Relative: 0.8 % (ref 0.0–3.0)
Eosinophils Absolute: 0.2 10*3/uL (ref 0.0–0.7)
Eosinophils Relative: 2.1 % (ref 0.0–5.0)
HCT: 32.2 % — ABNORMAL LOW (ref 36.0–46.0)
Hemoglobin: 10.6 g/dL — ABNORMAL LOW (ref 12.0–15.0)
Lymphocytes Relative: 37.6 % (ref 12.0–46.0)
Lymphs Abs: 3.6 10*3/uL (ref 0.7–4.0)
MCHC: 33 g/dL (ref 30.0–36.0)
MCV: 71.6 fl — ABNORMAL LOW (ref 78.0–100.0)
Monocytes Absolute: 0.7 10*3/uL (ref 0.1–1.0)
Monocytes Relative: 7.1 % (ref 3.0–12.0)
Neutro Abs: 5 10*3/uL (ref 1.4–7.7)
Neutrophils Relative %: 52.4 % (ref 43.0–77.0)
Platelets: 364 10*3/uL (ref 150.0–400.0)
RBC: 4.49 Mil/uL (ref 3.87–5.11)
RDW: 18.6 % — ABNORMAL HIGH (ref 11.5–15.5)
WBC: 9.6 10*3/uL (ref 4.0–10.5)

## 2018-02-16 LAB — BASIC METABOLIC PANEL
BUN: 7 mg/dL (ref 6–23)
CO2: 25 mEq/L (ref 19–32)
Calcium: 9.7 mg/dL (ref 8.4–10.5)
Chloride: 104 mEq/L (ref 96–112)
Creatinine, Ser: 0.87 mg/dL (ref 0.40–1.20)
GFR: 91.73 mL/min (ref >60.00)
Glucose, Bld: 99 mg/dL (ref 70–99)
Potassium: 3.5 mEq/L (ref 3.5–5.1)
Sodium: 136 mEq/L (ref 135–145)

## 2018-02-16 LAB — IBC PANEL
Iron: 19 ug/dL — ABNORMAL LOW (ref 42–145)
Saturation Ratios: 3.8 % — ABNORMAL LOW (ref 20.0–50.0)
Transferrin: 360 mg/dL (ref 212.0–360.0)

## 2018-02-16 LAB — MAGNESIUM: Magnesium: 1.8 mg/dL (ref 1.5–2.5)

## 2018-02-16 MED ORDER — MONTELUKAST SODIUM 10 MG PO TABS: 10.0000 mg | ORAL_TABLET | Freq: Every day | ORAL | 0 refills | Status: DC

## 2018-02-16 MED ORDER — CYANOCOBALAMIN 1000 MCG/ML IJ SOLN
1000.0000 ug | Freq: Once | INTRAMUSCULAR | Status: AC
  Administered 2018-02-16: 1000 ug via INTRAMUSCULAR

## 2018-02-16 NOTE — Patient Instructions (Signed)
Hives Hives (urticaria) are itchy, red, swollen areas on your skin. Hives can show up on any part of your body, and they can vary in size. They can be as small as the tip of a pen or much larger. Hives often fade within 24 hours (acute hives). In other cases, new hives show up after old ones fade. This can continue for many days or weeks (chronic hives). Hives are caused by your body's reaction to an irritant or to something that you are allergic to (trigger). You can get hives right after being around a trigger or hours later. Hives do not spread from person to person (are not contagious). Hives may get worse if you scratch them, if you exercise, or if you have worries (emotional stress). Follow these instructions at home: Medicines  Take or apply over-the-counter and prescription medicines only as told by your doctor.  If you were prescribed an antibiotic medicine, use it as told by your doctor. Do not stop taking the antibiotic even if you start to feel better. Skin Care  Apply cool, wet cloths (cool compresses) to the itchy, red, swollen areas.  Do not scratch your skin. Do not rub your skin. General instructions  Do not take hot showers or baths. This can make itching worse.  Do not wear tight clothes.  Use sunscreen and wear clothing that covers your skin when you are outside.  Avoid any triggers that cause your hives. Keep a journal to help you keep track of what causes your hives. Write down: ? What medicines you take. ? What you eat and drink. ? What products you use on your skin.  Keep all follow-up visits as told by your doctor. This is important. Contact a doctor if:  Your symptoms are not better with medicine.  Your joints are painful or swollen. Get help right away if:  You have a fever.  You have belly pain.  Your tongue or lips are swollen.  Your eyelids are swollen.  Your chest or throat feels tight.  You have trouble breathing or swallowing. These  symptoms may be an emergency. Do not wait to see if the symptoms will go away. Get medical help right away. Call your local emergency services (911 in the [place] Do not drive yourself to the hospital. This information is not intended to replace advice given to you by your health care provider. Make sure you discuss any questions you have with your health care provider. Document Released: 09/14/2008 Document Revised: 05/13/2016 Document Reviewed: 09/24/2015 Elsevier Interactive Patient Education  2018 Elsevier Inc.  

## 2018-02-16 NOTE — Progress Notes (Signed)
Subjective:  Patient ID: Samantha Ball, female    DOB: 12/07/1976  Age: 42 y.o. MRN: [id number]  CC: Anemia and Rash   HPI Samantha Ball presents for f/up - She complains that she is having recurrent episodes of hives on her arms, chest, and neck.  She tells me she is staying away from anti-inflammatories though she does complain of joint aches.  She has an appointment in the next few weeks with an allergist.  She is taking doxepin 25 mg at bedtime and is tolerating it well.  She thinks it has helped some but the hives have not completely resolved.  The last time she noticed the hives was a few days ago.  She does not have any rash or itching today.  Outpatient Medications Prior to Visit  Medication Sig Dispense Refill  . doxepin (SINEQUAN) 25 MG capsule Take 1 capsule (25 mg total) by mouth at bedtime. 90 capsule 1  . esomeprazole (NEXIUM) 40 MG capsule Take 1 capsule (40 mg total) by mouth daily at 12 noon. 90 capsule 3  . potassium chloride (K-DUR) 10 MEQ tablet Take 1 tablet (10 mEq total) by mouth 3 (three) times daily. 90 tablet 1  . spironolactone (ALDACTONE) 25 MG tablet Take 1 tablet (25 mg total) by mouth daily. 30 tablet 1   No facility-administered medications prior to visit.     ROS Review of Systems  Constitutional: Negative.  Negative for appetite change, diaphoresis, fatigue and fever.  HENT: Positive for congestion, postnasal drip and rhinorrhea. Negative for facial swelling, sinus pressure, sore throat, trouble swallowing and voice change.   Respiratory: Negative.  Negative for cough, chest tightness, shortness of breath and wheezing.   Cardiovascular: Negative for chest pain, palpitations and leg swelling.  Gastrointestinal: Negative for abdominal pain, blood in stool, constipation, diarrhea, nausea and vomiting.  Endocrine: Negative.   Genitourinary: Negative.  Negative for difficulty urinating.  Musculoskeletal: Positive for arthralgias. Negative for back pain,  myalgias and neck pain.  Skin: Positive for rash. Negative for color change and pallor.  Allergic/Immunologic: Negative.   Neurological: Negative.  Negative for dizziness, weakness and light-headedness.  Hematological: Negative for adenopathy. Does not bruise/bleed easily.  Psychiatric/Behavioral: Negative.     Objective:  BP 124/80 (BP Location: Left Arm, Patient Position: Sitting, Cuff Size: Large)   Pulse 96   Temp 97.9 F (36.6 C) (Oral)   Resp 16   Ht 5\' 3"  (1.6 m)   Wt 204 lb (92.5 kg)   LMP 01/17/2018   SpO2 99%   BMI 36.14 kg/m   BP Readings from Last 3 Encounters:  02/16/18 124/80  01/18/18 130/80  07/04/17 110/70    Wt Readings from Last 3 Encounters:  02/16/18 204 lb (92.5 kg)  01/18/18 204 lb (92.5 kg)  07/04/17 203 lb 8 oz (92.3 kg)    Physical Exam  Constitutional: She is oriented to person, place, and time. No distress.  HENT:  Mouth/Throat: Oropharynx is clear and moist. No oropharyngeal exudate.  Eyes: Conjunctivae are normal. Left eye exhibits no discharge. No scleral icterus.  Neck: Normal range of motion. Neck supple. No JVD present. No thyromegaly present.  Cardiovascular: Normal rate, regular rhythm and normal heart sounds. Exam reveals no gallop and no friction rub.  No murmur heard. Pulmonary/Chest: Effort normal and breath sounds normal. No respiratory distress. She has no wheezes. She has no rales.  Abdominal: Soft. Bowel sounds are normal. She exhibits no distension and no mass. There is no  tenderness. There is no guarding.  Musculoskeletal: Normal range of motion. She exhibits no edema, tenderness or deformity.  Lymphadenopathy:    She has no cervical adenopathy.  Neurological: She is alert and oriented to person, place, and time.  Skin: Skin is warm and dry. No rash noted. She is not diaphoretic. No erythema. No pallor.  There is no rash today  Psychiatric: She has a normal mood and affect. Her behavior is normal. Judgment and thought  content normal.  Vitals reviewed.   Lab Results  Component Value Date   WBC 9.6 02/16/2018   HGB 10.6 (L) 02/16/2018   HCT 32.2 (L) 02/16/2018   PLT 364.0 02/16/2018   GLUCOSE 99 02/16/2018   CHOL 155 12/21/2016   TRIG 224.0 (H) 12/21/2016   HDL 32.50 (L) 12/21/2016   LDLDIRECT 99.0 12/21/2016   LDLCALC 115 (H) 07/11/2013   ALT 16 01/18/2018   AST 16 01/18/2018   NA 136 02/16/2018   K 3.5 02/16/2018   CL 104 02/16/2018   CREATININE 0.87 02/16/2018   BUN 7 02/16/2018   CO2 25 02/16/2018   TSH 1.84 01/18/2018   HGBA1C 6.2 01/18/2018    Dg Chest 2 View  Result Date: 04/04/2017 CLINICAL DATA:  Cough, congestion and dyspnea for 1 week. EXAM: CHEST  2 VIEW COMPARISON:  None. FINDINGS: Cardiomediastinal silhouette is normal. No pleural effusions or focal consolidations. Trachea projects midline and there is no pneumothorax. Soft tissue planes and included osseous structures are non-suspicious. IMPRESSION: Normal chest. Electronically Signed   By: Elon Alas M.D.   On: 04/04/2017 16:19    Assessment & Plan:   Samantha Ball was seen today for anemia and rash.  Diagnoses and all orders for this visit:  Other iron deficiency anemias- Her H&H are down to 10.6 and 32.2 respectively.  Her iron and ferritin levels are low.  I have asked her to restart an iron supplement. -     IBC panel; Future -     CBC with Differential/Platelet; Future -     Ferritin; Future -     ferrous sulfate 325 (65 FE) MG tablet; Take 1 tablet (325 mg total) by mouth 3 (three) times daily with meals.  B12 deficiency- Her H&H are low.  Will continue monthly B12 injections. -     cyanocobalamin ((VITAMIN B-12)) injection 1,000 mcg -     CBC with Differential/Platelet; Future  Diuretic-induced hypokalemia- Her potassium level is barely in the normal range at 3.5.  Will continue spironolactone and the current potassium supplement. -     Magnesium; Future -     Basic metabolic panel; Future  Seasonal  allergic rhinitis due to pollen -     montelukast (SINGULAIR) 10 MG tablet; Take 1 tablet (10 mg total) by mouth at bedtime.  Urticaria, chronic- She will stay on doxepin at the current dose.  I will add montelukast.  She knows to avoid anti-inflammatories.  She will control her pain with Tylenol as needed. -     montelukast (SINGULAIR) 10 MG tablet; Take 1 tablet (10 mg total) by mouth at bedtime.   I am having Willodean D. Walrond start on montelukast and ferrous sulfate. I am also having her maintain her esomeprazole, doxepin, potassium chloride, and spironolactone. We administered cyanocobalamin.  Meds ordered this encounter  Medications  . cyanocobalamin ((VITAMIN B-12)) injection 1,000 mcg  . montelukast (SINGULAIR) 10 MG tablet    Sig: Take 1 tablet (10 mg total) by mouth at bedtime.  Dispense:  90 tablet    Refill:  0  . ferrous sulfate 325 (65 FE) MG tablet    Sig: Take 1 tablet (325 mg total) by mouth 3 (three) times daily with meals.    Dispense:  90 tablet    Refill:  3     Follow-up: Return in about 2 months (around 04/16/2018).  Scarlette Calico, MD

## 2018-02-17 ENCOUNTER — Encounter: Payer: Self-pay | Admitting: Internal Medicine

## 2018-02-17 LAB — FERRITIN: Ferritin: 5 ng/mL — ABNORMAL LOW (ref 10.0–291.0)

## 2018-02-17 MED ORDER — FERROUS SULFATE 325 (65 FE) MG PO TABS: 325.0000 mg | ORAL_TABLET | Freq: Three times a day (TID) | ORAL | 3 refills | Status: DC

## 2018-02-27 ENCOUNTER — Telehealth: Payer: Self-pay

## 2018-02-27 NOTE — Telephone Encounter (Signed)
Patient states since she started doxepin, she feels like she's eating more and her hair is coming out---can you call something else in to [facility] on [address]---routing to dr Ronnald Ramp, please advise, thanks

## 2018-03-05 ENCOUNTER — Other Ambulatory Visit: Payer: Self-pay | Admitting: Internal Medicine

## 2018-03-05 DIAGNOSIS — I10 Essential (primary) hypertension: Secondary | ICD-10-CM

## 2018-03-06 ENCOUNTER — Ambulatory Visit: Payer: 59 | Admitting: Allergy and Immunology

## 2018-03-06 ENCOUNTER — Encounter: Payer: Self-pay | Admitting: Allergy and Immunology

## 2018-03-06 VITALS — BP 130/80 | HR 68 | Temp 97.7°F | Resp 18 | Ht 64.0 in | Wt 207.0 lb

## 2018-03-06 DIAGNOSIS — J3089 Other allergic rhinitis: Secondary | ICD-10-CM | POA: Diagnosis not present

## 2018-03-06 DIAGNOSIS — T7840XA Allergy, unspecified, initial encounter: Secondary | ICD-10-CM | POA: Insufficient documentation

## 2018-03-06 DIAGNOSIS — L500 Allergic urticaria: Secondary | ICD-10-CM | POA: Diagnosis not present

## 2018-03-06 DIAGNOSIS — T7840XD Allergy, unspecified, subsequent encounter: Secondary | ICD-10-CM | POA: Diagnosis not present

## 2018-03-06 MED ORDER — FLUTICASONE PROPIONATE 50 MCG/ACT NA SUSP: NASAL | 5 refills | Status: DC

## 2018-03-06 MED ORDER — RANITIDINE HCL 150 MG PO TABS: 150.0000 mg | ORAL_TABLET | Freq: Two times a day (BID) | ORAL | 5 refills | Status: DC

## 2018-03-06 MED ORDER — LEVOCETIRIZINE DIHYDROCHLORIDE 5 MG PO TABS: 5.0000 mg | ORAL_TABLET | Freq: Every evening | ORAL | 5 refills | Status: DC

## 2018-03-06 NOTE — Assessment & Plan Note (Signed)
   Aeroallergen avoidance measures have been discussed and provided in written form.  Levocetirizine has been prescribed (as above).  A prescription has been provided for fluticasone nasal spray, one spray per nostril 1-2 times daily as needed. Proper nasal spray technique has been discussed and demonstrated.  Nasal saline spray (i.e., Simply Saline) or nasal saline lavage (i.e., NeilMed) is recommended as needed and prior to medicated nasal sprays.

## 2018-03-06 NOTE — Progress Notes (Signed)
New Patient Note  RE: Samantha Ball MRN: [id number] DOB: April 16, 1976 Date of Office Visit: 03/06/2018  Referring provider: [name], MD Primary care provider: [name], MD  Chief Complaint: Allergic Reaction and Urticaria   History of present illness: Samantha Ball is a 42 y.o. female seen today in consultation requested by Scarlette Calico, MD.  Over the past 2 month, Samantha Ball has experienced recurrent episodes of hives. The hives have appeared at different times over her face, neck, chest and arms.  The lesions are described as erythematous, raised, and pruritic.  Individual hives last less than 24 hours without leaving residual pigmentation or bruising. She denies concomitant angioedema, cardiopulmonary symptoms and GI symptoms. She has not experienced unexpected weight loss, recurrent fevers or drenching night sweats. No specific medication, food, skin care product, detergent, soap, or other environmental triggers have been identified. The symptoms do not seem to correlate with NSAIDs use or emotional stress. She did not have symptoms consistent with a respiratory tract infection at the time of symptom onset. [name] has tried to control symptoms with montelukast and diphenhydramine with moderate benefit temporary relief. She has not been evaluated and treated in the emergency department for these symptoms. Skin biopsy has not been performed. Recently, she has been experiencing hives daily. She experiences mild nasal congestion occasionally.  No significant seasonal symptom variation has been noted nor have specific environmental triggers been identified.   Assessment and plan: Recurrent urticaria Unclear etiology. Skin tests to select food allergens were negative today. NSAIDs and emotional stress commonly exacerbate urticaria but are not the underlying etiology in this case. Physical urticarias are negative by history (i.e. pressure-induced, temperature, vibration, solar, etc.).  History and lesions are not consistent with urticaria pigmentosa so I am not suspicious for mastocytosis. There are no concomitant symptoms concerning for anaphylaxis or constitutional symptoms worrisome for an underlying malignancy. We will rule out other potential etiologies with labs. For symptom relief, patient is to take oral antihistamines as directed.  The following labs have been ordered: FCeRI antibody, anti-thyroglobulin antibody, thyroid peroxidase antibody, tryptase, CBC, CMP, ESR, ANA, and galactose-alpha-1,3-galactose IgE level.  The patient will be called with further recommendations after lab results have returned.  Instructions have been discussed and provided for H1/H2 receptor blockade with titration to find lowest effective dose.  A prescription has been provided for levocetirizine, 5 mg daily as needed.  A prescription has been provided for ranitidine 150 mg twice daily if needed.  For now, continue montelukast 10 mg daily.  Should there be a significant increase or change in symptoms, a journal is to be kept recording any foods eaten, beverages consumed, medications taken within a 6 hour period prior to the onset of symptoms, as well as record activities being performed, and environmental conditions. For any symptoms concerning for anaphylaxis, 911 is to be called immediately.  Perennial allergic rhinitis  Aeroallergen avoidance measures have been discussed and provided in written form.  Levocetirizine has been prescribed (as above).  A prescription has been provided for fluticasone nasal spray, one spray per nostril 1-2 times daily as needed. Proper nasal spray technique has been discussed and demonstrated.  Nasal saline spray (i.e., Simply Saline) or nasal saline lavage (i.e., NeilMed) is recommended as needed and prior to medicated nasal sprays.   Meds ordered this encounter  Medications  . levocetirizine (XYZAL) 5 MG tablet    Sig: Take 1 tablet (5 mg total) by  mouth every evening.  Dispense:  30 tablet    Refill:  5  . ranitidine (ZANTAC) 150 MG tablet    Sig: Take 1 tablet (150 mg total) by mouth 2 (two) times daily.    Dispense:  60 tablet    Refill:  5  . fluticasone (FLONASE) 50 MCG/ACT nasal spray    Sig: One spray Per Nostril 1-2 times daily as Needed.    Dispense:  16 g    Refill:  5    Diagnostics: Environmental skin testing: Positive to molds. Food allergen skin testing: Negative despite a positive histamine control.    Physical examination: Blood pressure 130/80, pulse 68, temperature 97.7 F (36.5 C), temperature source Oral, resp. rate 18, height 5' 4" (1.626 m), weight 207 lb (93.9 kg).  General: Alert, interactive, in no acute distress. HEENT: TMs pearly gray, turbinates edematous with crusty discharge, post-pharynx moderately erythematous. Neck: Supple without lymphadenopathy. Lungs: Clear to auscultation without wheezing, rhonchi or rales. CV: Normal S1, S2 without murmurs. Abdomen: Nondistended, nontender. Skin: Warm and dry, without lesions or rashes. Extremities:  No clubbing, cyanosis or edema. Neuro:   Grossly intact.  Review of systems:  Review of systems negative except as noted in HPI / PMHx or noted below: Review of Systems  Constitutional: Negative.   HENT: Negative.   Eyes: Negative.   Respiratory: Negative.   Cardiovascular: Negative.   Gastrointestinal: Negative.   Genitourinary: Negative.   Musculoskeletal: Negative.   Skin: Negative.   Neurological: Negative.   Endo/Heme/Allergies: Negative.   Psychiatric/Behavioral: Negative.     Past medical history:  Past Medical History:  Diagnosis Date  . Hyperlipidemia   . Hypertension   . Urticaria     Past surgical history:  Past Surgical History:  Procedure Laterality Date  . TUBAL LIGATION    . wisdom teeth  2000    Family history: Family History  Problem Relation Age of Onset  . Heart disease Father   . Diabetes Maternal Aunt     . Cancer Maternal Grandmother   . Allergic rhinitis Daughter   . Eczema Daughter   . Alcohol abuse Neg Hx   . COPD Neg Hx   . Depression Neg Hx   . Drug abuse Neg Hx   . Early death Neg Hx   . Hearing loss Neg Hx   . Hyperlipidemia Neg Hx   . Hypertension Neg Hx   . Kidney disease Neg Hx   . Stroke Neg Hx     Social history: Social History   Socioeconomic History  . Marital status: Married    Spouse name: Not on file  . Number of children: Not on file  . Years of education: Not on file  . Highest education level: Not on file  Social Needs  . Financial resource strain: Not on file  . Food insecurity - worry: Not on file  . Food insecurity - inability: Not on file  . Transportation needs - medical: Not on file  . Transportation needs - non-medical: Not on file  Occupational History  . Not on file  Tobacco Use  . Smoking status: Never Smoker  . Smokeless tobacco: Never Used  Substance and Sexual Activity  . Alcohol use: No  . Drug use: No  . Sexual activity: Yes    Birth control/protection: Surgical    Comment: BTL  Other Topics Concern  . Not on file  Social History Narrative  . Not on file   Environmental History: The patient lives in an 43 year old  house with carpeting throughout, gas heat, and central air.  There is a wrap in the house which does not have access to her bedroom.  She is a non-smoker.  There is no known mold/water damage in the home.  Allergies as of 03/06/2018      Reactions   Amlodipine    constipation      Medication List        Accurate as of 03/06/18  1:19 PM. Always use your most recent med list.          doxepin 25 MG capsule Commonly known as:  SINEQUAN Take 1 capsule (25 mg total) by mouth at bedtime.   esomeprazole 40 MG capsule Commonly known as:  NEXIUM Take 1 capsule (40 mg total) by mouth daily at 12 noon.   ferrous sulfate 325 (65 FE) MG tablet Take 1 tablet (325 mg total) by mouth 3 (three) times daily with  meals.   fluticasone 50 MCG/ACT nasal spray Commonly known as:  FLONASE One spray Per Nostril 1-2 times daily as Needed.   levocetirizine 5 MG tablet Commonly known as:  XYZAL Take 1 tablet (5 mg total) by mouth every evening.   montelukast 10 MG tablet Commonly known as:  SINGULAIR Take 1 tablet (10 mg total) by mouth at bedtime.   naproxen 250 MG tablet Commonly known as:  NAPROSYN Take 250 mg by mouth daily.   potassium chloride 10 MEQ tablet Commonly known as:  K-DUR Take 1 tablet (10 mEq total) by mouth 3 (three) times daily.   ranitidine 150 MG tablet Commonly known as:  ZANTAC Take 1 tablet (150 mg total) by mouth 2 (two) times daily.   spironolactone 25 MG tablet Commonly known as:  ALDACTONE Take 1 tablet (25 mg total) by mouth daily.       Known medication allergies: Allergies  Allergen Reactions  . Amlodipine     constipation    I appreciate the opportunity to take part in [facility] care. Please do not hesitate to contact me with questions.  Sincerely,   R. Edgar Frisk, MD

## 2018-03-06 NOTE — Assessment & Plan Note (Signed)
Unclear etiology. Skin tests to select food allergens were negative today. NSAIDs and emotional stress commonly exacerbate urticaria but are not the underlying etiology in this case. Physical urticarias are negative by history (i.e. pressure-induced, temperature, vibration, solar, etc.). History and lesions are not consistent with urticaria pigmentosa so I am not suspicious for mastocytosis. There are no concomitant symptoms concerning for anaphylaxis or constitutional symptoms worrisome for an underlying malignancy. We will rule out other potential etiologies with labs. For symptom relief, patient is to take oral antihistamines as directed.  The following labs have been ordered: FCeRI antibody, anti-thyroglobulin antibody, thyroid peroxidase antibody, tryptase, CBC, CMP, ESR, ANA, and galactose-alpha-1,3-galactose IgE level.  The patient will be called with further recommendations after lab results have returned.  Instructions have been discussed and provided for H1/H2 receptor blockade with titration to find lowest effective dose.  A prescription has been provided for levocetirizine, 5 mg daily as needed.  A prescription has been provided for ranitidine 150 mg twice daily if needed.  For now, continue montelukast 10 mg daily.  Should there be a significant increase or change in symptoms, a journal is to be kept recording any foods eaten, beverages consumed, medications taken within a 6 hour period prior to the onset of symptoms, as well as record activities being performed, and environmental conditions. For any symptoms concerning for anaphylaxis, 911 is to be called immediately.

## 2018-03-06 NOTE — Patient Instructions (Addendum)
Recurrent urticaria Unclear etiology. Skin tests to select food allergens were negative today. NSAIDs and emotional stress commonly exacerbate urticaria but are not the underlying etiology in this case. Physical urticarias are negative by history (i.e. pressure-induced, temperature, vibration, solar, etc.). History and lesions are not consistent with urticaria pigmentosa so I am not suspicious for mastocytosis. There are no concomitant symptoms concerning for anaphylaxis or constitutional symptoms worrisome for an underlying malignancy. We will rule out other potential etiologies with labs. For symptom relief, patient is to take oral antihistamines as directed.  The following labs have been ordered: FCeRI antibody, anti-thyroglobulin antibody, thyroid peroxidase antibody, tryptase, CBC, CMP, ESR, ANA, and galactose-alpha-1,3-galactose IgE level.  The patient will be called with further recommendations after lab results have returned.  Instructions have been discussed and provided for H1/H2 receptor blockade with titration to find lowest effective dose.  A prescription has been provided for levocetirizine, 5 mg daily as needed.  A prescription has been provided for ranitidine 150 mg twice daily if needed.  For now, continue montelukast 10 mg daily.  Should there be a significant increase or change in symptoms, a journal is to be kept recording any foods eaten, beverages consumed, medications taken within a 6 hour period prior to the onset of symptoms, as well as record activities being performed, and environmental conditions. For any symptoms concerning for anaphylaxis, 911 is to be called immediately.  Perennial allergic rhinitis  Aeroallergen avoidance measures have been discussed and provided in written form.  Levocetirizine has been prescribed (as above).  A prescription has been provided for fluticasone nasal spray, one spray per nostril 1-2 times daily as needed. Proper nasal spray  technique has been discussed and demonstrated.  Nasal saline spray (i.e., Simply Saline) or nasal saline lavage (i.e., NeilMed) is recommended as needed and prior to medicated nasal sprays.   When lab results have returned the patient will be called with further recommendations and follow up instructions.  Urticaria (Hives)  . Levocetirizine (Xyzal) 5 mg twice a day and ranitidine (Zantac) 150 mg twice a day. If no symptoms for 7-14 days then decrease to. . Levocetirizine (Xyzal) 5 mg twice a day and ranitidine (Zantac) 150 mg once a day.  If no symptoms for 7-14 days then decrease to. . Levocetirizine (Xyzal) 5 mg twice a day.  If no symptoms for 7-14 days then decrease to. . Levocetirizine (Xyzal) 5 mg once a day.  May use Benadryl (diphenhydramine) as needed for breakthrough symptoms       If symptoms return, then step up dosage  Control of Mold Allergen  Mold and fungi can grow on a variety of surfaces provided certain temperature and moisture conditions exist.  Outdoor molds grow on plants, decaying vegetation and soil.  The major outdoor mold, Alternaria and Cladosporium, are found in very high numbers during hot and dry conditions.  Generally, a late Summer - Fall peak is seen for common outdoor fungal spores.  Rain will temporarily lower outdoor mold spore count, but counts rise rapidly when the rainy period ends.  The most important indoor molds are Aspergillus and Penicillium.  Dark, humid and poorly ventilated basements are ideal sites for mold growth.  The next most common sites of mold growth are the bathroom and the kitchen.  Outdoor [employer] 1. Use air conditioning and keep windows closed 2. Avoid exposure to decaying vegetation. 3. Avoid leaf raking. 4. Avoid grain handling. 5. Consider wearing a face mask if working in moldy areas.  Indoor Mold Control 1. Maintain humidity below 50%. 2. Clean washable surfaces with 5% bleach solution. 3. Remove sources e.g.  Contaminated carpets.

## 2018-03-08 ENCOUNTER — Ambulatory Visit: Payer: 59 | Admitting: Gastroenterology

## 2018-03-08 ENCOUNTER — Encounter: Payer: Self-pay | Admitting: Gastroenterology

## 2018-03-08 VITALS — BP 118/80 | HR 82 | Ht 63.0 in | Wt 212.0 lb

## 2018-03-08 DIAGNOSIS — D509 Iron deficiency anemia, unspecified: Secondary | ICD-10-CM

## 2018-03-08 DIAGNOSIS — K5900 Constipation, unspecified: Secondary | ICD-10-CM

## 2018-03-08 DIAGNOSIS — K219 Gastro-esophageal reflux disease without esophagitis: Secondary | ICD-10-CM | POA: Diagnosis not present

## 2018-03-08 DIAGNOSIS — K921 Melena: Secondary | ICD-10-CM | POA: Diagnosis not present

## 2018-03-08 MED ORDER — NA SULFATE-K SULFATE-MG SULF 17.5-3.13-1.6 GM/177ML PO SOLN: 1.0000 | Freq: Once | ORAL | 0 refills | Status: AC

## 2018-03-08 NOTE — Patient Instructions (Signed)
You have been scheduled for a colonoscopy. Please follow written instructions given to you at your visit today.  Please pick up your prep supplies at the pharmacy within the next 1-3 days. If you use inhalers (even only as needed), please bring them with you on the day of your procedure. Your physician has requested that you go to www.startemmi.com and enter the access code given to you at your visit today. This web site gives a general overview about your procedure. However, you should still follow specific instructions given to you by our office regarding your preparation for the procedure.  Pleas start Miralax mixing 17 grams in 8 oz of water daily. Leading up to your Colonoscopy, please take 1-3 doses of Miralax to have bowel movements regular.  Normal BMI (Body Mass Index- based on height and weight) is between 19 and 25. Your BMI today is Body mass index is 37.55 kg/m. Marland Kitchen Please consider follow up  regarding your BMI with your Primary Care Provider.  Thank you for choosing me and [facility].  Pricilla Riffle. Dagoberto Ligas., MD., Marval Regal

## 2018-03-08 NOTE — Progress Notes (Signed)
History of Present Illness: This is a 42 year old female referred by [name], MD for the evaluation of constipation, iron deficiency and hematochezia. Has B12 deficiency as well.  She has had chronic problems with constipation for many years that have not changed.  Since beginning iron she has noted more frequent bowel movements.  Prior to beginning iron she would have a bowel movement about every 4-7 days. More recently she is having a bowel movement about every second or third day however does not feel she has complete evacuation.  She has noted small amounts of bright red blood on the tissue paper and sometimes in the commode following bowel movements for several months.  She was recently found to be iron deficient.  She states she underwent colonoscopy about 10 years ago at [facility] and as she recalls that exam was normal.  Unfortunately we do not have those records available today.  She has a history of GERD and symptoms are well controlled. Denies weight loss, abdominal pain, diarrhea, change in stool caliber, melena, nausea, vomiting, dysphagia, reflux symptoms, chest pain.      Allergies  Allergen Reactions  . Amlodipine     constipation   Outpatient Medications Prior to Visit  Medication Sig Dispense Refill  . doxepin (SINEQUAN) 25 MG capsule Take 1 capsule (25 mg total) by mouth at bedtime. 90 capsule 1  . esomeprazole (NEXIUM) 40 MG capsule Take 1 capsule (40 mg total) by mouth daily at 12 noon. 90 capsule 3  . ferrous sulfate 325 (65 FE) MG tablet Take 1 tablet (325 mg total) by mouth 3 (three) times daily with meals. 90 tablet 3  . fluticasone (FLONASE) 50 MCG/ACT nasal spray One spray Per Nostril 1-2 times daily as Needed. 16 g 5  . levocetirizine (XYZAL) 5 MG tablet Take 1 tablet (5 mg total) by mouth every evening. 30 tablet 5  . montelukast (SINGULAIR) 10 MG tablet Take 1 tablet (10 mg total) by mouth at bedtime. 90 tablet 0  . naproxen (NAPROSYN)  250 MG tablet Take 250 mg by mouth daily.    . potassium chloride (K-DUR) 10 MEQ tablet Take 1 tablet (10 mEq total) by mouth 3 (three) times daily. 90 tablet 1  . ranitidine (ZANTAC) 150 MG tablet Take 1 tablet (150 mg total) by mouth 2 (two) times daily. 60 tablet 5  . spironolactone (ALDACTONE) 25 MG tablet Take 1 tablet (25 mg total) by mouth daily. 30 tablet 1   No facility-administered medications prior to visit.    Past Medical History:  Diagnosis Date  . Anemia   . Hyperlipidemia   . Hypertension   . Obesity   . Sleep apnea   . Urticaria    Past Surgical History:  Procedure Laterality Date  . TUBAL LIGATION    . wisdom teeth  2000   Social History   Socioeconomic History  . Marital status: Married    Spouse name: None  . Number of children: None  . Years of education: None  . Highest education level: None  Social Needs  . Financial resource strain: None  . Food insecurity - worry: None  . Food insecurity - inability: None  . Transportation needs - medical: None  . Transportation needs - non-medical: None  Occupational History  . None  Tobacco Use  . Smoking status: Never Smoker  . Smokeless tobacco: Never Used  Substance and Sexual Activity  . Alcohol use: No  . Drug  use: No  . Sexual activity: Yes    Birth control/protection: Surgical    Comment: BTL  Other Topics Concern  . None  Social History Narrative  . None   Family History  Problem Relation Age of Onset  . Heart disease Father   . Diabetes Maternal Aunt   . Breast cancer Maternal Grandmother   . Allergic rhinitis Daughter   . Eczema Daughter   . Alcohol abuse Neg Hx   . COPD Neg Hx   . Depression Neg Hx   . Drug abuse Neg Hx   . Early death Neg Hx   . Hearing loss Neg Hx   . Hyperlipidemia Neg Hx   . Hypertension Neg Hx   . Kidney disease Neg Hx   . Stroke Neg Hx       Review of Systems: Pertinent positive and negative review of systems were noted in the above HPI section. All  other review of systems were otherwise negative.    Physical Exam: General: Well developed, well nourished, no acute distress Head: Normocephalic and atraumatic Eyes:  sclerae anicteric, EOMI Ears: Normal auditory acuity Mouth: No deformity or lesions Neck: Supple, no masses or thyromegaly Lungs: Clear throughout to auscultation Heart: Regular rate and rhythm; no murmurs, rubs or bruits Abdomen: Soft, non tender and non distended. No masses, hepatosplenomegaly or hernias noted. Normal Bowel sounds Rectal: Deferred to colonoscopy Musculoskeletal: Symmetrical with no gross deformities  Skin: No lesions on visible extremities Pulses:  Normal pulses noted Extremities: No clubbing, cyanosis, edema or deformities noted Neurological: Alert oriented x 4, grossly nonfocal Cervical Nodes:  No significant cervical adenopathy Inguinal Nodes: No significant inguinal adenopathy Psychological:  Alert and cooperative. Normal mood and affect  Assessment and Recommendations:  1. Iron deficiency anemia, hematochezia, chronic constipation.  Rule out colorectal neoplasms, hemorrhoids, IBD and other disorders.  Begin MiraLAX daily increase to twice daily if not having a complete bowel movement every day or every other day.  Schedule colonoscopy. Hold iron for 5 days prior to colonoscopy.  The risks (including bleeding, perforation, infection, missed lesions, medication reactions and possible hospitalization or surgery if complications occur), benefits, and alternatives to colonoscopy with possible biopsy and possible polypectomy were discussed with the patient and they consent to proceed.  Request records from prior colonoscopy.  2. GERD, well-controlled.  Continue antireflux measures, Nexium 40 mg daily and ranitidine 150 mg twice daily.   cc: [name], MD [address] [place], [place] [address]

## 2018-03-10 LAB — CBC WITH DIFFERENTIAL/PLATELET
Basophils Absolute: 0 10*3/uL (ref 0.0–0.2)
Basos: 0 %
EOS (ABSOLUTE): 0.1 10*3/uL (ref 0.0–0.4)
Eos: 1 %
Hematocrit: 37.5 % (ref 34.0–46.6)
Hemoglobin: 12.4 g/dL (ref 11.1–15.9)
Immature Grans (Abs): 0 10*3/uL (ref 0.0–0.1)
Immature Granulocytes: 0 %
Lymphocytes Absolute: 3 10*3/uL (ref 0.7–3.1)
Lymphs: 37 %
MCH: 24.6 pg — ABNORMAL LOW (ref 26.6–33.0)
MCHC: 33.1 g/dL (ref 31.5–35.7)
MCV: 74 fL — ABNORMAL LOW (ref 79–97)
Monocytes Absolute: 0.5 10*3/uL (ref 0.1–0.9)
Monocytes: 6 %
Neutrophils Absolute: 4.5 10*3/uL (ref 1.4–7.0)
Neutrophils: 56 %
Platelets: 311 10*3/uL (ref 150–379)
RBC: 5.04 x10E6/uL (ref 3.77–5.28)
RDW: 21 % — ABNORMAL HIGH (ref 12.3–15.4)
WBC: 8.1 10*3/uL (ref 3.4–10.8)

## 2018-03-10 LAB — CHRONIC URTICARIA: cu index: <6.1 (ref <10)

## 2018-03-10 LAB — ALPHA-GAL PANEL
Alpha Gal IgE*: <0.1 kU/L (ref <0.10)
Beef (Bos spp) IgE: <0.1 kU/L (ref <0.35)
Class Interpretation: 0
Class Interpretation: 0
Class Interpretation: 0
Lamb/Mutton (Ovis spp) IgE: <0.1 kU/L (ref <0.35)
Pork (Sus spp) IgE: <0.1 kU/L (ref <0.35)

## 2018-03-10 LAB — COMPREHENSIVE METABOLIC PANEL
ALT: 18 IU/L (ref 0–32)
AST: 16 IU/L (ref 0–40)
Albumin/Globulin Ratio: 1.6 (ref 1.2–2.2)
Albumin: 4.9 g/dL (ref 3.5–5.5)
Alkaline Phosphatase: 54 IU/L (ref 39–117)
BUN/Creatinine Ratio: 9 (ref 9–23)
BUN: 7 mg/dL (ref 6–24)
Bilirubin Total: 0.3 mg/dL (ref 0.0–1.2)
CO2: 21 mmol/L (ref 20–29)
Calcium: 10.5 mg/dL — ABNORMAL HIGH (ref 8.7–10.2)
Chloride: 104 mmol/L (ref 96–106)
Creatinine, Ser: 0.8 mg/dL (ref 0.57–1.00)
GFR calc Af Amer: 105 mL/min/{1.73_m2} (ref >59)
GFR calc non Af Amer: 91 mL/min/{1.73_m2} (ref >59)
Globulin, Total: 3 g/dL (ref 1.5–4.5)
Glucose: 88 mg/dL (ref 65–99)
Potassium: 4.3 mmol/L (ref 3.5–5.2)
Sodium: 140 mmol/L (ref 134–144)
Total Protein: 7.9 g/dL (ref 6.0–8.5)

## 2018-03-10 LAB — TRYPTASE: Tryptase: 1.9 ug/L — ABNORMAL LOW (ref 2.2–13.2)

## 2018-03-10 LAB — SEDIMENTATION RATE: Sed Rate: 14 mm/hr (ref 0–32)

## 2018-03-10 LAB — THYROID PEROXIDASE ANTIBODY: Thyroperoxidase Ab SerPl-aCnc: 11 IU/mL (ref 0–34)

## 2018-03-10 LAB — ANA: Anti Nuclear Antibody(ANA): NEGATIVE

## 2018-03-10 LAB — THYROGLOBULIN ANTIBODY: Thyroglobulin Antibody: <1 IU/mL (ref 0.0–0.9)

## 2018-03-17 ENCOUNTER — Other Ambulatory Visit: Payer: Self-pay | Admitting: Internal Medicine

## 2018-03-17 ENCOUNTER — Ambulatory Visit (INDEPENDENT_AMBULATORY_CARE_PROVIDER_SITE_OTHER): Payer: 59 | Admitting: Emergency Medicine

## 2018-03-17 DIAGNOSIS — D508 Other iron deficiency anemias: Secondary | ICD-10-CM

## 2018-03-17 DIAGNOSIS — E876 Hypokalemia: Secondary | ICD-10-CM

## 2018-03-17 DIAGNOSIS — I10 Essential (primary) hypertension: Secondary | ICD-10-CM

## 2018-03-17 DIAGNOSIS — T502X5A Adverse effect of carbonic-anhydrase inhibitors, benzothiadiazides and other diuretics, initial encounter: Principal | ICD-10-CM

## 2018-03-17 MED ORDER — CYANOCOBALAMIN 1000 MCG/ML IJ SOLN
1000.0000 ug | Freq: Once | INTRAMUSCULAR | Status: AC
  Administered 2018-03-17: 1000 ug via INTRAMUSCULAR

## 2018-03-20 ENCOUNTER — Ambulatory Visit: Payer: 59 | Admitting: Allergy

## 2018-03-22 ENCOUNTER — Encounter: Payer: Self-pay | Admitting: Gastroenterology

## 2018-03-25 ENCOUNTER — Other Ambulatory Visit: Payer: Self-pay | Admitting: Internal Medicine

## 2018-04-03 ENCOUNTER — Encounter: Payer: Self-pay | Admitting: Gastroenterology

## 2018-04-03 ENCOUNTER — Ambulatory Visit (AMBULATORY_SURGERY_CENTER): Payer: 59 | Admitting: Gastroenterology

## 2018-04-03 ENCOUNTER — Other Ambulatory Visit: Payer: Self-pay

## 2018-04-03 VITALS — BP 131/90 | HR 71 | Temp 98.6°F | Resp 13 | Ht 63.0 in | Wt 212.0 lb

## 2018-04-03 DIAGNOSIS — K921 Melena: Secondary | ICD-10-CM | POA: Diagnosis not present

## 2018-04-03 DIAGNOSIS — K5900 Constipation, unspecified: Secondary | ICD-10-CM | POA: Diagnosis not present

## 2018-04-03 DIAGNOSIS — D509 Iron deficiency anemia, unspecified: Secondary | ICD-10-CM

## 2018-04-03 MED ORDER — SODIUM CHLORIDE 0.9 % IV SOLN: 500.0000 mL | Freq: Once | INTRAVENOUS | Status: DC

## 2018-04-03 NOTE — Op Note (Signed)
[facility] Patient Name: Samantha Ball Procedure Date: 04/03/2018 2:40 PM MRN: [id number] Endoscopist: Ladene Artist , MD Age: 42 Referring MD:  Date of Birth: 05-30-1976 Gender: Female Account #: [id number] Procedure:                Colonoscopy Indications:              Hematochezia, Iron deficiency anemia Medicines:                Monitored Anesthesia Care Procedure:                Pre-Anesthesia Assessment:                           - Prior to the procedure, a History and Physical                            was performed, and patient medications and                            allergies were reviewed. The patient's tolerance of                            previous anesthesia was also reviewed. The risks                            and benefits of the procedure and the sedation                            options and risks were discussed with the patient.                            All questions were answered, and informed consent                            was obtained. Prior Anticoagulants: The patient has                            taken no previous anticoagulant or antiplatelet                            agents. ASA Grade Assessment: II - A patient with                            mild systemic disease. After reviewing the risks                            and benefits, the patient was deemed in                            satisfactory condition to undergo the procedure.                           After obtaining informed consent, the colonoscope  was passed under direct vision. Throughout the                            procedure, the patient's blood pressure, pulse, and                            oxygen saturations were monitored continuously. The                            Colonoscope was introduced through the anus and                            advanced to the the cecum, identified by                            appendiceal orifice and ileocecal  valve. The                            ileocecal valve, appendiceal orifice, and rectum                            were photographed. The quality of the bowel                            preparation was good. The colonoscopy was performed                            without difficulty. The patient tolerated the                            procedure well. Scope In: 2:47:39 PM Scope Out: 3:00:27 PM Scope Withdrawal Time: 0 hours 9 minutes 45 seconds  Total Procedure Duration: 0 hours 12 minutes 48 seconds  Findings:                 The perianal and digital rectal examinations were                            normal.                           A few medium-mouthed diverticula were found in the                            ascending colon.                           Internal hemorrhoids were found during                            retroflexion. The hemorrhoids were small and Grade                            I (internal hemorrhoids that do not prolapse).  The exam was otherwise without abnormality on                            direct and retroflexion views. Complications:            No immediate complications. Estimated blood loss:                            None. Estimated Blood Loss:     Estimated blood loss: none. Impression:               - Mild diverticulosis in the ascending colon.                           - Internal hemorrhoids.                           - The examination was otherwise normal on direct                            and retroflexion views.                           - No specimens collected. Recommendation:           - Repeat colonoscopy in 10 years for screening                            purposes.                           - Patient has a contact number available for                            emergencies. The signs and symptoms of potential                            delayed complications were discussed with the                            patient.  Return to normal activities tomorrow.                            Written discharge instructions were provided to the                            patient.                           - Resume previous diet.                           - Continue present medications. Ladene Artist, MD 04/03/2018 3:03:55 PM This report has been signed electronically.

## 2018-04-03 NOTE — Patient Instructions (Signed)
*  Handouts given on diverticulosis and hemorrhoids.  YOU HAD AN ENDOSCOPIC PROCEDURE TODAY AT THE [facility]:   Refer to the procedure report that was given to you for any specific questions about what was found during the examination.  If the procedure report does not answer your questions, please call your gastroenterologist to clarify.  If you requested that your care partner not be given the details of your procedure findings, then the procedure report has been included in a sealed envelope for you to review at your convenience later.  YOU SHOULD EXPECT: Some feelings of bloating in the abdomen. Passage of more gas than usual.  Walking can help get rid of the air that was put into your GI tract during the procedure and reduce the bloating. If you had a lower endoscopy (such as a colonoscopy or flexible sigmoidoscopy) you may notice spotting of blood in your stool or on the toilet paper. If you underwent a bowel prep for your procedure, you may not have a normal bowel movement for a few days.  Please Note:  You might notice some irritation and congestion in your nose or some drainage.  This is from the oxygen used during your procedure.  There is no need for concern and it should clear up in a day or so.  SYMPTOMS TO REPORT IMMEDIATELY:   Following lower endoscopy (colonoscopy or flexible sigmoidoscopy):  Excessive amounts of blood in the stool  Significant tenderness or worsening of abdominal pains  Swelling of the abdomen that is new, acute  Fever of 100F or higher   For urgent or emergent issues, a gastroenterologist can be reached at any hour by calling [phone].   DIET:  We do recommend a small meal at first, but then you may proceed to your regular diet.  Drink plenty of fluids but you should avoid alcoholic beverages for 24 hours.  ACTIVITY:  You should plan to take it easy for the rest of today and you should NOT DRIVE or use heavy machinery until tomorrow  (because of the sedation medicines used during the test).    FOLLOW UP: Our staff will call the number listed on your records the next business day following your procedure to check on you and address any questions or concerns that you may have regarding the information given to you following your procedure. If we do not reach you, we will leave a message.  However, if you are feeling well and you are not experiencing any problems, there is no need to return our call.  We will assume that you have returned to your regular daily activities without incident.  If any biopsies were taken you will be contacted by phone or by letter within the next 1-3 weeks.  Please call us at [phone] if you have not heard about the biopsies in 3 weeks.    SIGNATURES/CONFIDENTIALITY: You and/or your care partner have signed paperwork which will be entered into your electronic medical record.  These signatures attest to the fact that that the information above on your After Visit Summary has been reviewed and is understood.  Full responsibility of the confidentiality of this discharge information lies with you and/or your care-partner. 

## 2018-04-03 NOTE — Progress Notes (Signed)
To PACU, VSS. Report to [email or web address] 

## 2018-04-04 ENCOUNTER — Telehealth: Payer: Self-pay

## 2018-04-04 NOTE — Telephone Encounter (Signed)
  Follow up Call-  Call back number 04/03/2018  Post procedure Call Back phone  # [phone]  Permission to leave phone message Yes  Some recent data might be hidden     Patient questions:  Do you have a fever, pain , or abdominal swelling? No. Pain Score  0 *  Have you tolerated food without any problems? Yes.    Have you been able to return to your normal activities? Yes.    Do you have any questions about your discharge instructions: Diet   No. Medications  No. Follow up visit  No.  Do you have questions or concerns about your Care? No.  Actions: * If pain score is 4 or above: No action needed, pain <4.

## 2018-04-09 LAB — HM COLONOSCOPY

## 2018-04-18 ENCOUNTER — Ambulatory Visit (INDEPENDENT_AMBULATORY_CARE_PROVIDER_SITE_OTHER): Payer: 59

## 2018-04-18 DIAGNOSIS — E538 Deficiency of other specified B group vitamins: Secondary | ICD-10-CM | POA: Diagnosis not present

## 2018-04-18 MED ORDER — CYANOCOBALAMIN 1000 MCG/ML IJ SOLN
1000.0000 ug | Freq: Once | INTRAMUSCULAR | Status: AC
  Administered 2018-04-18: 1000 ug via INTRAMUSCULAR

## 2018-04-18 NOTE — Progress Notes (Signed)
I have reviewed and agree.

## 2018-05-13 ENCOUNTER — Other Ambulatory Visit: Payer: Self-pay | Admitting: Internal Medicine

## 2018-05-13 DIAGNOSIS — T502X5A Adverse effect of carbonic-anhydrase inhibitors, benzothiadiazides and other diuretics, initial encounter: Principal | ICD-10-CM

## 2018-05-13 DIAGNOSIS — E876 Hypokalemia: Secondary | ICD-10-CM

## 2018-05-18 ENCOUNTER — Ambulatory Visit (INDEPENDENT_AMBULATORY_CARE_PROVIDER_SITE_OTHER): Payer: 59

## 2018-05-18 DIAGNOSIS — E538 Deficiency of other specified B group vitamins: Secondary | ICD-10-CM | POA: Diagnosis not present

## 2018-05-18 MED ORDER — CYANOCOBALAMIN 1000 MCG/ML IJ SOLN
1000.0000 ug | Freq: Once | INTRAMUSCULAR | Status: AC
  Administered 2018-05-18: 1000 ug via INTRAMUSCULAR

## 2018-05-18 NOTE — Progress Notes (Signed)
I have reviewed and agree.

## 2018-06-14 ENCOUNTER — Ambulatory Visit (INDEPENDENT_AMBULATORY_CARE_PROVIDER_SITE_OTHER): Payer: 59

## 2018-06-14 DIAGNOSIS — E538 Deficiency of other specified B group vitamins: Secondary | ICD-10-CM

## 2018-06-14 MED ORDER — CYANOCOBALAMIN 1000 MCG/ML IJ SOLN
1000.0000 ug | Freq: Once | INTRAMUSCULAR | Status: AC
  Administered 2018-06-14: 1000 ug via INTRAMUSCULAR

## 2018-06-14 NOTE — Progress Notes (Signed)
I have reviewed and agree.

## 2018-06-15 ENCOUNTER — Other Ambulatory Visit: Payer: Self-pay | Admitting: Internal Medicine

## 2018-06-17 ENCOUNTER — Other Ambulatory Visit: Payer: Self-pay | Admitting: Internal Medicine

## 2018-06-28 ENCOUNTER — Ambulatory Visit: Payer: 59

## 2018-07-04 ENCOUNTER — Encounter: Payer: Self-pay | Admitting: Internal Medicine

## 2018-07-04 ENCOUNTER — Ambulatory Visit: Payer: 59 | Admitting: Internal Medicine

## 2018-07-04 DIAGNOSIS — M79671 Pain in right foot: Secondary | ICD-10-CM | POA: Diagnosis not present

## 2018-07-04 MED ORDER — MELOXICAM 15 MG PO TABS: 15.0000 mg | ORAL_TABLET | Freq: Every day | ORAL | 0 refills | Status: DC

## 2018-07-04 NOTE — Assessment & Plan Note (Signed)
Unclear etiology. Could be related to some shoes although she claims no new shoes. Rx for meloxicam and educated not to take any other NSAIDs including giving their names to her. Can take tylenol with it. If no improvement further in 2 weeks can see podiatry.

## 2018-07-04 NOTE — Patient Instructions (Signed)
We have sent in meloxicam (mobic) to take daily for the next 2 weeks for the pain to get it feeling better.   It is okay to take tylenol along with this. It is not okay to take ibuprofen, aleve, naproxen along with this.

## 2018-07-04 NOTE — Progress Notes (Signed)
   Subjective:    Patient ID: Samantha Ball, female    DOB: 1976-11-30, 42 y.o.   MRN: [id number]  HPI The patient is a 42 YO female coming in for right foot pain for about 2-3 weeks. She woke up with whole body pain on that day it started. This lasted for about 2-3 days and then stopped. She may have run out of naproxen before it started but she cannot recall. She was left with right foot pain which did not relieve. She denies any cold or flu like symptoms during that time. She may have been diagnosed with some fibromyalgia in the past but she is unsure. She denies redness or swelling in the foot. Overall is improving gradually (maybe 50% better now). She was on vacation last week and was not walking as much as usual. She is having pain with walking more. She denies pain in the foot with just standing or sitting. She is not able to rate it on a scale of 1-10. She has been taking tylenol for it which temporarily helps some. No injury or overuse or new shoes at the onset.   Review of Systems  Constitutional: Positive for activity change. Negative for appetite change, chills, fatigue, fever and unexpected weight change.  Respiratory: Negative.   Cardiovascular: Negative.   Gastrointestinal: Negative.   Musculoskeletal: Positive for arthralgias and myalgias. Negative for back pain, gait problem and joint swelling.  Skin: Negative.   Neurological: Negative.       Objective:   Physical Exam  Constitutional: She is oriented to person, place, and time. She appears well-developed and well-nourished.  HENT:  Head: Normocephalic and atraumatic.  Eyes: EOM are normal.  Neck: Normal range of motion.  Cardiovascular: Normal rate and regular rhythm.  Pulmonary/Chest: Effort normal and breath sounds normal. No respiratory distress. She has no wheezes. She has no rales.  Abdominal: Soft.  Musculoskeletal: She exhibits tenderness. She exhibits no edema.  Mild tenderness midfoot right on the top surface.  No pain on the plantar surface or along the sides or at the ankle. No swelling or color change.   Neurological: She is alert and oriented to person, place, and time. Coordination normal.  Skin: Skin is warm and dry.   Vitals:   07/04/18 0925  BP: 132/90  Pulse: 88  Temp: 98.4 F (36.9 C)  TempSrc: Oral  SpO2: 97%  Weight: 212 lb (96.2 kg)  Height: 5\' 3"  (1.6 m)      Assessment & Plan:

## 2018-07-05 ENCOUNTER — Ambulatory Visit: Payer: 59 | Admitting: Family

## 2018-07-05 DIAGNOSIS — H903 Sensorineural hearing loss, bilateral: Secondary | ICD-10-CM | POA: Diagnosis not present

## 2018-07-05 DIAGNOSIS — H9313 Tinnitus, bilateral: Secondary | ICD-10-CM | POA: Diagnosis not present

## 2018-07-14 ENCOUNTER — Ambulatory Visit: Payer: 59

## 2018-07-16 ENCOUNTER — Other Ambulatory Visit: Payer: Self-pay | Admitting: Internal Medicine

## 2018-07-16 DIAGNOSIS — D508 Other iron deficiency anemias: Secondary | ICD-10-CM

## 2018-07-17 ENCOUNTER — Ambulatory Visit: Payer: 59 | Admitting: Internal Medicine

## 2018-07-20 ENCOUNTER — Ambulatory Visit (INDEPENDENT_AMBULATORY_CARE_PROVIDER_SITE_OTHER): Payer: 59

## 2018-07-20 DIAGNOSIS — E538 Deficiency of other specified B group vitamins: Secondary | ICD-10-CM

## 2018-07-20 MED ORDER — CYANOCOBALAMIN 1000 MCG/ML IJ SOLN
1000.0000 ug | Freq: Once | INTRAMUSCULAR | Status: AC
  Administered 2018-07-20: 1000 ug via INTRAMUSCULAR

## 2018-07-20 NOTE — Progress Notes (Signed)
b12 Injection given.   Jessamine Barcia J Cydney Alvarenga, MD  

## 2018-07-28 DIAGNOSIS — Z461 Encounter for fitting and adjustment of hearing aid: Secondary | ICD-10-CM | POA: Diagnosis not present

## 2018-07-28 DIAGNOSIS — H9313 Tinnitus, bilateral: Secondary | ICD-10-CM | POA: Diagnosis not present

## 2018-07-28 DIAGNOSIS — H903 Sensorineural hearing loss, bilateral: Secondary | ICD-10-CM | POA: Diagnosis not present

## 2018-08-08 ENCOUNTER — Other Ambulatory Visit: Payer: Self-pay | Admitting: Allergy and Immunology

## 2018-08-08 DIAGNOSIS — J3089 Other allergic rhinitis: Secondary | ICD-10-CM

## 2018-08-08 DIAGNOSIS — L500 Allergic urticaria: Secondary | ICD-10-CM

## 2018-08-13 ENCOUNTER — Other Ambulatory Visit: Payer: Self-pay | Admitting: Allergy and Immunology

## 2018-08-13 DIAGNOSIS — L500 Allergic urticaria: Secondary | ICD-10-CM

## 2018-08-22 ENCOUNTER — Ambulatory Visit (INDEPENDENT_AMBULATORY_CARE_PROVIDER_SITE_OTHER): Payer: 59 | Admitting: Emergency Medicine

## 2018-08-22 DIAGNOSIS — D508 Other iron deficiency anemias: Secondary | ICD-10-CM | POA: Diagnosis not present

## 2018-08-22 MED ORDER — CYANOCOBALAMIN 1000 MCG/ML IJ SOLN
1000.0000 ug | Freq: Once | INTRAMUSCULAR | Status: AC
  Administered 2018-08-22: 1000 ug via INTRAMUSCULAR

## 2018-08-22 NOTE — Progress Notes (Signed)
I have reviewed and agree.

## 2018-08-23 ENCOUNTER — Other Ambulatory Visit: Payer: Self-pay | Admitting: Internal Medicine

## 2018-09-14 ENCOUNTER — Other Ambulatory Visit: Payer: Self-pay | Admitting: Internal Medicine

## 2018-09-14 DIAGNOSIS — T502X5A Adverse effect of carbonic-anhydrase inhibitors, benzothiadiazides and other diuretics, initial encounter: Secondary | ICD-10-CM

## 2018-09-14 DIAGNOSIS — I10 Essential (primary) hypertension: Secondary | ICD-10-CM

## 2018-09-14 DIAGNOSIS — E876 Hypokalemia: Secondary | ICD-10-CM

## 2018-09-20 DIAGNOSIS — Z6836 Body mass index (BMI) 36.0-36.9, adult: Secondary | ICD-10-CM | POA: Diagnosis not present

## 2018-09-20 DIAGNOSIS — N920 Excessive and frequent menstruation with regular cycle: Secondary | ICD-10-CM | POA: Diagnosis not present

## 2018-09-20 DIAGNOSIS — Z124 Encounter for screening for malignant neoplasm of cervix: Secondary | ICD-10-CM | POA: Diagnosis not present

## 2018-09-20 DIAGNOSIS — Z01411 Encounter for gynecological examination (general) (routine) with abnormal findings: Secondary | ICD-10-CM | POA: Diagnosis not present

## 2018-10-02 LAB — HM PAP SMEAR

## 2018-10-19 ENCOUNTER — Ambulatory Visit (INDEPENDENT_AMBULATORY_CARE_PROVIDER_SITE_OTHER): Payer: 59

## 2018-10-19 DIAGNOSIS — E538 Deficiency of other specified B group vitamins: Secondary | ICD-10-CM

## 2018-10-19 MED ORDER — CYANOCOBALAMIN 1000 MCG/ML IJ SOLN
1000.0000 ug | Freq: Once | INTRAMUSCULAR | Status: AC
  Administered 2018-10-19: 1000 ug via INTRAMUSCULAR

## 2018-10-19 NOTE — Progress Notes (Signed)
I have reviewed and agree.

## 2018-10-22 ENCOUNTER — Other Ambulatory Visit: Payer: Self-pay | Admitting: Internal Medicine

## 2018-11-02 ENCOUNTER — Ambulatory Visit: Payer: 59 | Admitting: Family

## 2018-11-02 ENCOUNTER — Encounter: Payer: Self-pay | Admitting: Family

## 2018-11-02 ENCOUNTER — Ambulatory Visit: Payer: Self-pay

## 2018-11-02 VITALS — BP 138/88 | HR 76 | Temp 98.3°F | Ht 63.0 in | Wt 218.1 lb

## 2018-11-02 DIAGNOSIS — G43809 Other migraine, not intractable, without status migrainosus: Secondary | ICD-10-CM

## 2018-11-02 MED ORDER — KETOROLAC TROMETHAMINE 30 MG/ML IJ SOLN
30.0000 mg | Freq: Once | INTRAMUSCULAR | Status: AC
  Administered 2018-11-02: 30 mg via INTRAMUSCULAR

## 2018-11-02 NOTE — Telephone Encounter (Signed)
Returned call to pt.  Reported onset of Migraine HA this morning about 5:00 AM. Stated the HA is "behind right eye and on right side of head."  Reported she took Excedrin XS, 2 tabs, at 6:00 AM, and Tylenol 500 mg., 1 tab., at 9:00 AM.  Stated the combination of Excedrin and Tylenol usually work, but today it has not.  Rated headache pain at 8/10 and constant.  Denied any vision changes.  C/o nausea; no vomiting.  Denied fever, but stated her office temperature feels very cold. Denied stiff neck. Advised with severity of pain, would recommend scheduling appt. with PCP office; appt. given at 1:40 PM.  Pt. was given Care Advice, per protocol.  Verb. Understanding.        Reason for Disposition . [1] SEVERE headache (e.g., excruciating) AND [2] not improved after 2 hours of pain medicine  Answer Assessment - Initial Assessment Questions 1. LOCATION: "Where does it hurt?"      Right side of head and behind eye  2. ONSET: "When did the headache start?" (Minutes, hours or days)      When awoke about 5:00 AM 3. PATTERN: "Does the pain come and go, or has it been constant since it started?"    Constant  4. SEVERITY: "How bad is the pain?" and "What does it keep you from doing?"  (e.g., Scale 1-10; mild, moderate, or severe)   - MILD (1-3): doesn't interfere with normal activities    - MODERATE (4-7): interferes with normal activities or awakens from sleep    - SEVERE (8-10): excruciating pain, unable to do any normal activities       8/10 5. RECURRENT SYMPTOM: "Have you ever had headaches before?" If so, ask: "When was the last time?" and "What happened that time?"      Has had migraines before; usually takes Excedrin and Tylenol 6. CAUSE: "What do you think is causing the headache?"     Migraine 7. MIGRAINE: "Have you been diagnosed with migraine headaches?" If so, ask: "Is this headache similar?"      Yes 8. HEAD INJURY: "Has there been any recent injury to the head?"      no 9. OTHER SYMPTOMS:  "Do you have any other symptoms?" (fever, stiff neck, eye pain, sore throat, cold symptoms)      Right eye pain, c/o nausea, denied stiff neck; denied fever   10. PREGNANCY: "Is there any chance you are pregnant?" "When was your last menstrual period?"       LMP- started cycle today  Protocols used: HEADACHE-A-AH

## 2018-11-02 NOTE — Progress Notes (Signed)
Samantha Ball is a 42 y.o. female with the following history as recorded in [employer]:  Patient Active Problem List   Diagnosis Date Noted  . Right foot pain 07/04/2018  . Perennial allergic rhinitis 03/06/2018  . Allergic reaction 03/06/2018  . Recurrent urticaria 01/18/2018  . Hyperglycemia 01/18/2018  . Seasonal allergic rhinitis due to pollen 05/09/2017  . Hyperlipidemia LDL goal <130 12/21/2016  . Snoring 11/09/2016  . Other iron deficiency anemias 12/17/2014  . Insomnia 01/09/2014  . GERD (gastroesophageal reflux disease) 01/09/2014  . Constipation 01/09/2014  . Diuretic-induced hypokalemia 01/09/2014  . Positive ANA (antinuclear antibody) 07/16/2013  . Essential hypertension, benign 07/11/2013  . Routine general medical examination at a health care facility 07/11/2013  . Obesity (BMI 30.0-34.9) 07/11/2013    Current Outpatient Medications  Medication Sig Dispense Refill  . esomeprazole (NEXIUM) 40 MG capsule TAKE 1 CAPSULE BY MOUTH EVERY DAY 30 capsule 0  . ferrous sulfate 325 (65 FE) MG tablet TAKE 1 TABLET (325 MG TOTAL) BY MOUTH 3 (THREE) TIMES DAILY WITH MEALS. 90 tablet 1  . KLOR-CON 10 10 MEQ tablet TAKE 1 TABLET BY MOUTH THREE TIMES A DAY 90 tablet 0  . levocetirizine (XYZAL) 5 MG tablet TAKE 1 TABLET BY MOUTH EVERY DAY IN THE EVENING 90 tablet 0  . meloxicam (MOBIC) 15 MG tablet Take 1 tablet (15 mg total) by mouth daily. 30 tablet 0  . naproxen (NAPROSYN) 375 MG tablet TAKE 1 TABLET BY MOUTH TWICE A DAY WITH MEALS 60 tablet 1  . ranitidine (ZANTAC) 150 MG tablet TAKE 1 TABLET BY MOUTH TWICE A DAY 30 tablet 0  . spironolactone (ALDACTONE) 25 MG tablet TAKE 1 TABLET BY MOUTH EVERY DAY 30 tablet 0  . doxepin (SINEQUAN) 25 MG capsule Take 1 capsule (25 mg total) by mouth at bedtime. (Patient not taking: Reported on 04/03/2018) 90 capsule 1  . fluticasone (FLONASE) 50 MCG/ACT nasal spray One spray Per Nostril 1-2 times daily as Needed. (Patient not taking: Reported on  07/04/2018) 16 g 5  . montelukast (SINGULAIR) 10 MG tablet Take 1 tablet (10 mg total) by mouth at bedtime. (Patient not taking: Reported on 07/04/2018) 90 tablet 0   No current facility-administered medications for this visit.     Allergies: Amlodipine  Past Medical History:  Diagnosis Date  . Anemia   . Hyperlipidemia   . Hypertension   . Obesity   . Sleep apnea   . Urticaria     Past Surgical History:  Procedure Laterality Date  . TUBAL LIGATION    . wisdom teeth  2000    Family History  Problem Relation Age of Onset  . Heart disease Father   . Diabetes Maternal Aunt   . Breast cancer Maternal Grandmother   . Allergic rhinitis Daughter   . Eczema Daughter   . Alcohol abuse Neg Hx   . COPD Neg Hx   . Depression Neg Hx   . Drug abuse Neg Hx   . Early death Neg Hx   . Hearing loss Neg Hx   . Hyperlipidemia Neg Hx   . Hypertension Neg Hx   . Kidney disease Neg Hx   . Stroke Neg Hx   . Colon cancer Neg Hx   . Rectal cancer Neg Hx   . Stomach cancer Neg Hx     Social History   Tobacco Use  . Smoking status: Never Smoker  . Smokeless tobacco: Never Used  Substance Use Topics  . Alcohol  use: No    Subjective:  Patient is accompanied by her husband today; history of migraine headache x 1 day; notes that she has struggle with migraines for at least 20+ years; has not talked to her PCP about treatment options; today, she is sound sensitive, pain localized over right side of head; may have a "bad" headache 1-2 x per month; is very concerned about having to start any type of regular injection to treat her headaches; had to leave work today due to pain; mild nausea but no vomiting; no vision changes/ no slurred speech/ no numbness/ tingling on right side of body;    Objective:  Vitals:   11/02/18 1337  BP: 138/88  Pulse: 76  Temp: 98.3 F (36.8 C)  TempSrc: Oral  SpO2: 99%  Weight: 218 lb 1.3 oz (98.9 kg)  Height: 5\' 3"  (1.6 m)    General: Well developed, well  nourished, in no acute distress; prefers room to be darkened Skin : Warm and dry.  Head: Normocephalic and atraumatic  Eyes: Sclera and conjunctiva clear; pupils round and reactive to light; extraocular movements intact  Ears: External normal; canals clear; tympanic membranes normal  Oropharynx: Pink, supple. No suspicious lesions  Neck: Supple without thyromegaly, adenopathy  Lungs: Respirations unlabored; clear to auscultation bilaterally without wheeze, rales, rhonchi  CVS exam: normal rate and regular rhythm.  Vessels: Symmetric bilaterally  Neurologic: Alert and oriented; speech intact; face symmetrical; moves all extremities well; CNII-XII intact without focal deficit   Assessment:  1. Other migraine without status migrainosus, not intractable     Plan:  Toradol IM 30 mg given today for immediate relief; work note given as requested; have asked patient to start keeping headache journal- plan to follow up with her PCP in about 1-2 weeks; suspect would be a good candidate for Toradol; she notes she does not want to do any type of injectable medications; follow-up if this particular headache does not resolve in the next 12-24 hours as expected.   Return in about 2 weeks (around 11/16/2018) for Dr. Ronnald Ramp migraine follow-up.  No orders of the defined types were placed in this encounter.   Requested Prescriptions    No prescriptions requested or ordered in this encounter

## 2018-11-02 NOTE — Patient Instructions (Signed)
Please keep headache diary as discussed/ bring to next OV with Dr. Ronnald Ramp   Migraine Headache A migraine headache is an intense, throbbing pain on one side or both sides of the head. Migraines may also cause other symptoms, such as nausea, vomiting, and sensitivity to light and noise. What are the causes? Doing or taking certain things may also trigger migraines, such as:  Alcohol.  Smoking.  Medicines, such as: ? Medicine used to treat chest pain (nitroglycerine). ? Birth control pills. ? Estrogen pills. ? Certain blood pressure medicines.  Aged cheeses, chocolate, or caffeine.  Foods or drinks that contain nitrates, glutamate, aspartame, or tyramine.  Physical activity.  Other things that may trigger a migraine include:  Menstruation.  Pregnancy.  Hunger.  Stress, lack of sleep, too much sleep, or fatigue.  Weather changes.  What increases the risk? The following factors may make you more likely to experience migraine headaches:  Age. Risk increases with age.  Family history of migraine headaches.  Being Caucasian.  Depression and anxiety.  Obesity.  Being a woman.  Having a hole in the heart (patent foramen ovale) or other heart problems.  What are the signs or symptoms? The main symptom of this condition is pulsating or throbbing pain. Pain may:  Happen in any area of the head, such as on one side or both sides.  Interfere with daily activities.  Get worse with physical activity.  Get worse with exposure to bright lights or loud noises.  Other symptoms may include:  Nausea.  Vomiting.  Dizziness.  General sensitivity to bright lights, loud noises, or smells.  Before you get a migraine, you may get warning signs that a migraine is developing (aura). An aura may include:  Seeing flashing lights or having blind spots.  Seeing bright spots, halos, or zigzag lines.  Having tunnel vision or blurred vision.  Having numbness or a tingling  feeling.  Having trouble talking.  Having muscle weakness.  How is this diagnosed? A migraine headache can be diagnosed based on:  Your symptoms.  A physical exam.  Tests, such as CT scan or MRI of the head. These imaging tests can help rule out other causes of headaches.  Taking fluid from the spine (lumbar puncture) and analyzing it (cerebrospinal fluid analysis, or CSF analysis).  How is this treated? A migraine headache is usually treated with medicines that:  Relieve pain.  Relieve nausea.  Prevent migraines from coming back.  Treatment may also include:  Acupuncture.  Lifestyle changes like avoiding foods that trigger migraines.  Follow these instructions at home: Medicines  Take over-the-counter and prescription medicines only as told by your health care provider.  Do not drive or use heavy machinery while taking prescription pain medicine.  To prevent or treat constipation while you are taking prescription pain medicine, your health care provider may recommend that you: ? Drink enough fluid to keep your urine clear or pale yellow. ? Take over-the-counter or prescription medicines. ? Eat foods that are high in fiber, such as fresh fruits and vegetables, whole grains, and beans. ? Limit foods that are high in fat and processed sugars, such as fried and sweet foods. Lifestyle  Avoid alcohol use.  Do not use any products that contain nicotine or tobacco, such as cigarettes and e-cigarettes. If you need help quitting, ask your health care provider.  Get at least 8 hours of sleep every night.  Limit your stress. General instructions   Keep a journal to find out  what may trigger your migraine headaches. For example, write down: ? What you eat and drink. ? How much sleep you get. ? Any change to your diet or medicines.  If you have a migraine: ? Avoid things that make your symptoms worse, such as bright lights. ? It may help to lie down in a dark, quiet  room. ? Do not drive or use heavy machinery. ? Ask your health care provider what activities are safe for you while you are experiencing symptoms.  Keep all follow-up visits as told by your health care provider. This is important. Contact a health care provider if:  You develop symptoms that are different or more severe than your usual migraine symptoms. Get help right away if:  Your migraine becomes severe.  You have a fever.  You have a stiff neck.  You have vision loss.  Your muscles feel weak or like you cannot control them.  You start to lose your balance often.  You develop trouble walking.  You faint. This information is not intended to replace advice given to you by your health care provider. Make sure you discuss any questions you have with your health care provider. Document Released: 12/06/2005 Document Revised: 06/25/2016 Document Reviewed: 05/24/2016 Elsevier Interactive Patient Education  2017 [employer].

## 2018-11-20 ENCOUNTER — Encounter: Payer: Self-pay | Admitting: Internal Medicine

## 2018-11-20 ENCOUNTER — Ambulatory Visit: Payer: 59 | Admitting: Internal Medicine

## 2018-11-20 ENCOUNTER — Other Ambulatory Visit (INDEPENDENT_AMBULATORY_CARE_PROVIDER_SITE_OTHER): Payer: 59

## 2018-11-20 VITALS — BP 150/108 | HR 70 | Temp 98.6°F | Resp 16 | Ht 63.0 in | Wt 216.2 lb

## 2018-11-20 DIAGNOSIS — R7303 Prediabetes: Secondary | ICD-10-CM | POA: Diagnosis not present

## 2018-11-20 DIAGNOSIS — E785 Hyperlipidemia, unspecified: Secondary | ICD-10-CM

## 2018-11-20 DIAGNOSIS — Z0000 Encounter for general adult medical examination without abnormal findings: Secondary | ICD-10-CM | POA: Diagnosis not present

## 2018-11-20 DIAGNOSIS — E538 Deficiency of other specified B group vitamins: Secondary | ICD-10-CM

## 2018-11-20 DIAGNOSIS — D508 Other iron deficiency anemias: Secondary | ICD-10-CM | POA: Diagnosis not present

## 2018-11-20 DIAGNOSIS — Z1231 Encounter for screening mammogram for malignant neoplasm of breast: Secondary | ICD-10-CM

## 2018-11-20 DIAGNOSIS — I10 Essential (primary) hypertension: Secondary | ICD-10-CM

## 2018-11-20 DIAGNOSIS — E559 Vitamin D deficiency, unspecified: Secondary | ICD-10-CM

## 2018-11-20 DIAGNOSIS — T502X5A Adverse effect of carbonic-anhydrase inhibitors, benzothiadiazides and other diuretics, initial encounter: Secondary | ICD-10-CM

## 2018-11-20 DIAGNOSIS — Z23 Encounter for immunization: Secondary | ICD-10-CM

## 2018-11-20 DIAGNOSIS — E876 Hypokalemia: Secondary | ICD-10-CM

## 2018-11-20 LAB — URINALYSIS, ROUTINE W REFLEX MICROSCOPIC
Bilirubin Urine: NEGATIVE
Hgb urine dipstick: NEGATIVE
Ketones, ur: NEGATIVE
Nitrite: NEGATIVE
Specific Gravity, Urine: 1.01 (ref 1.000–1.030)
Total Protein, Urine: NEGATIVE
Urine Glucose: NEGATIVE
Urobilinogen, UA: 1 (ref 0.0–1.0)
pH: 7.5 (ref 5.0–8.0)

## 2018-11-20 LAB — HEMOGLOBIN A1C: Hgb A1c MFr Bld: 5.9 % (ref 4.6–6.5)

## 2018-11-20 LAB — LIPID PANEL
Cholesterol: 193 mg/dL (ref 0–200)
HDL: 41 mg/dL (ref >39.00)
LDL Cholesterol: 121 mg/dL — ABNORMAL HIGH (ref 0–99)
NonHDL: 151.69
Total CHOL/HDL Ratio: 5
Triglycerides: 155 mg/dL — ABNORMAL HIGH (ref 0.0–149.0)
VLDL: 31 mg/dL (ref 0.0–40.0)

## 2018-11-20 LAB — CBC WITH DIFFERENTIAL/PLATELET
Basophils Absolute: 0.1 10*3/uL (ref 0.0–0.1)
Basophils Relative: 0.9 % (ref 0.0–3.0)
Eosinophils Absolute: 0.1 10*3/uL (ref 0.0–0.7)
Eosinophils Relative: 1 % (ref 0.0–5.0)
HCT: 39.2 % (ref 36.0–46.0)
Hemoglobin: 13.2 g/dL (ref 12.0–15.0)
Lymphocytes Relative: 31.2 % (ref 12.0–46.0)
Lymphs Abs: 3 10*3/uL (ref 0.7–4.0)
MCHC: 33.7 g/dL (ref 30.0–36.0)
MCV: 80.7 fl (ref 78.0–100.0)
Monocytes Absolute: 0.8 10*3/uL (ref 0.1–1.0)
Monocytes Relative: 8.3 % (ref 3.0–12.0)
Neutro Abs: 5.7 10*3/uL (ref 1.4–7.7)
Neutrophils Relative %: 58.6 % (ref 43.0–77.0)
Platelets: 287 10*3/uL (ref 150.0–400.0)
RBC: 4.86 Mil/uL (ref 3.87–5.11)
RDW: 15.3 % (ref 11.5–15.5)
WBC: 9.8 10*3/uL (ref 4.0–10.5)

## 2018-11-20 LAB — COMPREHENSIVE METABOLIC PANEL
ALT: 14 U/L (ref 0–35)
AST: 12 U/L (ref 0–37)
Albumin: 4.5 g/dL (ref 3.5–5.2)
Alkaline Phosphatase: 62 U/L (ref 39–117)
BUN: 9 mg/dL (ref 6–23)
CO2: 25 mEq/L (ref 19–32)
Calcium: 10 mg/dL (ref 8.4–10.5)
Chloride: 106 mEq/L (ref 96–112)
Creatinine, Ser: 0.89 mg/dL (ref 0.40–1.20)
GFR: 89.03 mL/min (ref >60.00)
Glucose, Bld: 111 mg/dL — ABNORMAL HIGH (ref 70–99)
Potassium: 3.7 mEq/L (ref 3.5–5.1)
Sodium: 138 mEq/L (ref 135–145)
Total Bilirubin: 0.4 mg/dL (ref 0.2–1.2)
Total Protein: 7.8 g/dL (ref 6.0–8.3)

## 2018-11-20 LAB — TSH: TSH: 1.91 u[IU]/mL (ref 0.35–4.50)

## 2018-11-20 LAB — VITAMIN D 25 HYDROXY (VIT D DEFICIENCY, FRACTURES): VITD: 15.62 ng/mL — ABNORMAL LOW (ref 30.00–100.00)

## 2018-11-20 MED ORDER — NEBIVOLOL HCL 5 MG PO TABS: 5.0000 mg | ORAL_TABLET | Freq: Every day | ORAL | 0 refills | Status: DC

## 2018-11-20 MED ORDER — CYANOCOBALAMIN 1000 MCG/ML IJ SOLN
1000.0000 ug | Freq: Once | INTRAMUSCULAR | Status: AC
  Administered 2018-11-20: 1000 ug via INTRAMUSCULAR

## 2018-11-20 MED ORDER — SPIRONOLACTONE 25 MG PO TABS: 25.0000 mg | ORAL_TABLET | Freq: Every day | ORAL | 0 refills | Status: DC

## 2018-11-20 NOTE — Progress Notes (Signed)
Subjective:  Patient ID: Samantha Ball, female    DOB: 1976/08/25  Age: 42 y.o. MRN: [id number]  CC: Hypertension and Annual Exam   HPI Samantha Ball presents for a CPX.  She complains that her blood pressure is not well controlled and she has had a few headaches recently.  She tells me she is compliant with the spironolactone but according to her prescription refills she would have run out of this over a month ago.  She denies blurred vision/CP/DOE/palpitations/edema/fatigue.  Outpatient Medications Prior to Visit  Medication Sig Dispense Refill  . esomeprazole (NEXIUM) 40 MG capsule TAKE 1 CAPSULE BY MOUTH EVERY DAY 30 capsule 0  . ferrous sulfate 325 (65 FE) MG tablet TAKE 1 TABLET (325 MG TOTAL) BY MOUTH 3 (THREE) TIMES DAILY WITH MEALS. 90 tablet 1  . KLOR-CON 10 10 MEQ tablet TAKE 1 TABLET BY MOUTH THREE TIMES A DAY 90 tablet 0  . montelukast (SINGULAIR) 10 MG tablet Take 1 tablet (10 mg total) by mouth at bedtime. 90 tablet 0  . naproxen (NAPROSYN) 375 MG tablet TAKE 1 TABLET BY MOUTH TWICE A DAY WITH MEALS 60 tablet 1  . spironolactone (ALDACTONE) 25 MG tablet TAKE 1 TABLET BY MOUTH EVERY DAY 30 tablet 0  . doxepin (SINEQUAN) 25 MG capsule Take 1 capsule (25 mg total) by mouth at bedtime. (Patient not taking: Reported on 04/03/2018) 90 capsule 1  . fluticasone (FLONASE) 50 MCG/ACT nasal spray One spray Per Nostril 1-2 times daily as Needed. (Patient not taking: Reported on 07/04/2018) 16 g 5  . levocetirizine (XYZAL) 5 MG tablet TAKE 1 TABLET BY MOUTH EVERY DAY IN THE EVENING 90 tablet 0  . meloxicam (MOBIC) 15 MG tablet Take 1 tablet (15 mg total) by mouth daily. 30 tablet 0  . ranitidine (ZANTAC) 150 MG tablet TAKE 1 TABLET BY MOUTH TWICE A DAY 30 tablet 0   No facility-administered medications prior to visit.     ROS Review of Systems  Constitutional: Negative.  Negative for appetite change, diaphoresis, fatigue and fever.  HENT: Negative.   Eyes: Negative for visual  disturbance.  Respiratory: Negative for cough, chest tightness, shortness of breath and wheezing.   Cardiovascular: Negative for chest pain, palpitations and leg swelling.  Gastrointestinal: Negative for abdominal pain, constipation, diarrhea, nausea and vomiting.  Endocrine: Negative.   Genitourinary: Negative.  Negative for difficulty urinating.  Musculoskeletal: Negative.  Negative for arthralgias and myalgias.  Neurological: Positive for headaches. Negative for dizziness, weakness and light-headedness.  Hematological: Negative for adenopathy. Does not bruise/bleed easily.  Psychiatric/Behavioral: Negative.     Objective:  BP (!) 150/108 (BP Location: Left Arm, Patient Position: Sitting, Cuff Size: Large)   Pulse 70   Temp 98.6 F (37 C) (Oral)   Resp 16   Ht 5\' 3"  (1.6 m)   Wt 216 lb 4 oz (98.1 kg)   LMP 11/02/2018 Comment: s/p BTL  SpO2 98%   BMI 38.31 kg/m   BP Readings from Last 3 Encounters:  11/20/18 (!) 150/108  11/02/18 138/88  07/04/18 132/90    Wt Readings from Last 3 Encounters:  11/20/18 216 lb 4 oz (98.1 kg)  11/02/18 218 lb 1.3 oz (98.9 kg)  07/04/18 212 lb (96.2 kg)    Physical Exam  Constitutional: She is oriented to person, place, and time. No distress.  HENT:  Mouth/Throat: Oropharynx is clear and moist. No oropharyngeal exudate.  Eyes: Conjunctivae are normal. No scleral icterus.  Neck: Normal range of  motion. Neck supple. No JVD present. No thyromegaly present.  Cardiovascular: Normal rate, regular rhythm and normal heart sounds. Exam reveals no gallop and no friction rub.  No murmur heard. Pulmonary/Chest: Effort normal and breath sounds normal. She has no wheezes. She has no rales.  Abdominal: Soft. Normal appearance and bowel sounds are normal. There is no hepatosplenomegaly. There is no tenderness.  Musculoskeletal: Normal range of motion. She exhibits no edema, tenderness or deformity.  Lymphadenopathy:    She has no cervical adenopathy.    Neurological: She is alert and oriented to person, place, and time.  Skin: Skin is warm and dry. She is not diaphoretic. No pallor.  Vitals reviewed.   Lab Results  Component Value Date   WBC 9.8 11/20/2018   HGB 13.2 11/20/2018   HCT 39.2 11/20/2018   PLT 287.0 11/20/2018   GLUCOSE 111 (H) 11/20/2018   CHOL 193 11/20/2018   TRIG 155.0 (H) 11/20/2018   HDL 41.00 11/20/2018   LDLDIRECT 99.0 12/21/2016   LDLCALC 121 (H) 11/20/2018   ALT 14 11/20/2018   AST 12 11/20/2018   NA 138 11/20/2018   K 3.7 11/20/2018   CL 106 11/20/2018   CREATININE 0.89 11/20/2018   BUN 9 11/20/2018   CO2 25 11/20/2018   TSH 1.91 11/20/2018   HGBA1C 5.9 11/20/2018    Dg Chest 2 View  Result Date: 04/04/2017 CLINICAL DATA:  Cough, congestion and dyspnea for 1 week. EXAM: CHEST  2 VIEW COMPARISON:  None. FINDINGS: Cardiomediastinal silhouette is normal. No pleural effusions or focal consolidations. Trachea projects midline and there is no pneumothorax. Soft tissue planes and included osseous structures are non-suspicious. IMPRESSION: Normal chest. Electronically Signed   By: Elon Alas M.D.   On: 04/04/2017 16:19    Assessment & Plan:   Calani was seen today for hypertension and annual exam.  Diagnoses and all orders for this visit:  Routine general medical examination at a health care facility- Exam completed, labs reviewed, vaccines reviewed and updated, Pap is up-to-date, she is referred for screening mammogram, patient education materials were given. -     Lipid panel; Future  Need for influenza vaccination -     Flu Vaccine QUAD 36+ mos IM  Essential hypertension, benign - Her blood pressure is not adequately well controlled and she is symptomatic.  I have asked her to restart the spironolactone since she has a history of hypokalemia and will add nebivolol as well.  We will also treat the vitamin D deficiency.  Otherwise her labs are negative for secondary causes or endorgan damage. -      TSH; Future -     Urinalysis, Routine w reflex microscopic; Future -     Comprehensive metabolic panel; Future -     VITAMIN D 25 Hydroxy (Vit-D Deficiency, Fractures); Future -     spironolactone (ALDACTONE) 25 MG tablet; Take 1 tablet (25 mg total) by mouth daily. -     nebivolol (BYSTOLIC) 5 MG tablet; Take 1 tablet (5 mg total) by mouth daily.  Hyperlipidemia LDL goal <130- She does not have an elevated ASCVD risk score so I do not recommend a statin for CV risk reduction. -     TSH; Future  Other iron deficiency anemias- Her H&H are normal now. -     CBC with Differential/Platelet; Future  Prediabetes- Improvement noted -     Hemoglobin A1c; Future -     Comprehensive metabolic panel; Future  Diuretic-induced hypokalemia -  spironolactone (ALDACTONE) 25 MG tablet; Take 1 tablet (25 mg total) by mouth daily.  B12 deficiency -     cyanocobalamin ((VITAMIN B-12)) injection 1,000 mcg  Vitamin D deficiency disease -     Cholecalciferol 1.25 MG (50000 UT) capsule; Take 1 capsule (50,000 Units total) by mouth once a week.  Visit for screening mammogram -     MM DIGITAL SCREENING BILATERAL; Future   I have discontinued Cathlean Cower D. Hagemann's doxepin, fluticasone, meloxicam, levocetirizine, and ranitidine. I have also changed her spironolactone. Additionally, I am having her start on nebivolol and Cholecalciferol. Lastly, I am having her maintain her montelukast, KLOR-CON 10, ferrous sulfate, esomeprazole, and naproxen. We administered cyanocobalamin.  Meds ordered this encounter  Medications  . spironolactone (ALDACTONE) 25 MG tablet    Sig: Take 1 tablet (25 mg total) by mouth daily.    Dispense:  90 tablet    Refill:  0  . nebivolol (BYSTOLIC) 5 MG tablet    Sig: Take 1 tablet (5 mg total) by mouth daily.    Dispense:  49 tablet    Refill:  0  . cyanocobalamin ((VITAMIN B-12)) injection 1,000 mcg  . Cholecalciferol 1.25 MG (50000 UT) capsule    Sig: Take 1 capsule  (50,000 Units total) by mouth once a week.    Dispense:  12 capsule    Refill:  1     Follow-up: Return in about 6 weeks (around 01/01/2019).  Scarlette Calico, MD

## 2018-11-20 NOTE — Patient Instructions (Signed)

## 2018-11-21 ENCOUNTER — Encounter: Payer: Self-pay | Admitting: Internal Medicine

## 2018-11-21 ENCOUNTER — Other Ambulatory Visit: Payer: Self-pay | Admitting: Internal Medicine

## 2018-11-21 ENCOUNTER — Other Ambulatory Visit: Payer: 59

## 2018-11-21 DIAGNOSIS — R8281 Pyuria: Secondary | ICD-10-CM | POA: Diagnosis not present

## 2018-11-21 DIAGNOSIS — R8271 Bacteriuria: Secondary | ICD-10-CM | POA: Diagnosis not present

## 2018-11-21 DIAGNOSIS — N390 Urinary tract infection, site not specified: Secondary | ICD-10-CM | POA: Insufficient documentation

## 2018-11-21 DIAGNOSIS — E559 Vitamin D deficiency, unspecified: Secondary | ICD-10-CM | POA: Insufficient documentation

## 2018-11-21 DIAGNOSIS — E538 Deficiency of other specified B group vitamins: Secondary | ICD-10-CM | POA: Insufficient documentation

## 2018-11-21 MED ORDER — CHOLECALCIFEROL 1.25 MG (50000 UT) PO CAPS: 50000.0000 [IU] | ORAL_CAPSULE | ORAL | 1 refills | Status: DC

## 2018-11-23 ENCOUNTER — Ambulatory Visit: Payer: 59

## 2018-11-23 ENCOUNTER — Encounter: Payer: Self-pay | Admitting: Internal Medicine

## 2018-11-23 LAB — CULTURE, URINE COMPREHENSIVE
MICRO NUMBER:: 91445790
SPECIMEN QUALITY:: ADEQUATE

## 2018-12-12 ENCOUNTER — Other Ambulatory Visit: Payer: Self-pay | Admitting: Internal Medicine

## 2018-12-21 ENCOUNTER — Ambulatory Visit (INDEPENDENT_AMBULATORY_CARE_PROVIDER_SITE_OTHER): Payer: 59 | Admitting: *Deleted

## 2018-12-21 DIAGNOSIS — E538 Deficiency of other specified B group vitamins: Secondary | ICD-10-CM

## 2018-12-21 MED ORDER — CYANOCOBALAMIN 1000 MCG/ML IJ SOLN
1000.0000 ug | Freq: Once | INTRAMUSCULAR | Status: AC
  Administered 2018-12-21: 1000 ug via INTRAMUSCULAR

## 2018-12-21 NOTE — Progress Notes (Signed)
I have reviewed and agree.

## 2019-02-01 ENCOUNTER — Other Ambulatory Visit: Payer: 59

## 2019-02-01 ENCOUNTER — Encounter: Payer: Self-pay | Admitting: Internal Medicine

## 2019-02-01 ENCOUNTER — Other Ambulatory Visit (INDEPENDENT_AMBULATORY_CARE_PROVIDER_SITE_OTHER): Payer: 59

## 2019-02-01 ENCOUNTER — Ambulatory Visit: Payer: 59 | Admitting: Internal Medicine

## 2019-02-01 VITALS — BP 120/80 | HR 68 | Temp 98.3°F | Resp 16 | Ht 63.0 in | Wt 212.5 lb

## 2019-02-01 DIAGNOSIS — I10 Essential (primary) hypertension: Secondary | ICD-10-CM

## 2019-02-01 DIAGNOSIS — M2550 Pain in unspecified joint: Secondary | ICD-10-CM

## 2019-02-01 DIAGNOSIS — T502X5A Adverse effect of carbonic-anhydrase inhibitors, benzothiadiazides and other diuretics, initial encounter: Secondary | ICD-10-CM | POA: Diagnosis not present

## 2019-02-01 DIAGNOSIS — E538 Deficiency of other specified B group vitamins: Secondary | ICD-10-CM

## 2019-02-01 DIAGNOSIS — E876 Hypokalemia: Secondary | ICD-10-CM

## 2019-02-01 DIAGNOSIS — R768 Other specified abnormal immunological findings in serum: Secondary | ICD-10-CM

## 2019-02-01 LAB — BASIC METABOLIC PANEL
BUN: 12 mg/dL (ref 6–23)
CO2: 23 mEq/L (ref 19–32)
Calcium: 9.7 mg/dL (ref 8.4–10.5)
Chloride: 105 mEq/L (ref 96–112)
Creatinine, Ser: 0.9 mg/dL (ref 0.40–1.20)
GFR: 82.61 mL/min (ref >60.00)
Glucose, Bld: 111 mg/dL — ABNORMAL HIGH (ref 70–99)
Potassium: 4.1 mEq/L (ref 3.5–5.1)
Sodium: 138 mEq/L (ref 135–145)

## 2019-02-01 LAB — SEDIMENTATION RATE: Sed Rate: 12 mm/hr (ref 0–20)

## 2019-02-01 LAB — C-REACTIVE PROTEIN: CRP: <1 mg/dL (ref 0.5–20.0)

## 2019-02-01 MED ORDER — CYANOCOBALAMIN 1000 MCG/ML IJ SOLN
1000.0000 ug | Freq: Once | INTRAMUSCULAR | Status: AC
  Administered 2019-02-01: 1000 ug via INTRAMUSCULAR

## 2019-02-01 NOTE — Patient Instructions (Signed)

## 2019-02-01 NOTE — Progress Notes (Signed)
Subjective:  Patient ID: Samantha Ball, female    DOB: Mar 12, 1976  Age: 43 y.o. MRN: [id number]  CC: Hypertension   HPI CHAVIE KOLINSKI presents for a BP check - She tells me her blood pressure has been well controlled.  She complains of chronic arthralgias in the small joints of her fingers and hands as well as her knees and ankles.  She has a remote history of a positive ANA but tells me years ago she saw a rheumatology and was told that she does not have connective tissue disease and the ANA was not a significant finding.  She wants to repeat her titers today.  Outpatient Medications Prior to Visit  Medication Sig Dispense Refill  . Cholecalciferol 1.25 MG (50000 UT) capsule Take 1 capsule (50,000 Units total) by mouth once a week. 12 capsule 1  . esomeprazole (NEXIUM) 40 MG capsule TAKE 1 CAPSULE BY MOUTH EVERY DAY 90 capsule 0  . ferrous sulfate 325 (65 FE) MG tablet TAKE 1 TABLET (325 MG TOTAL) BY MOUTH 3 (THREE) TIMES DAILY WITH MEALS. 90 tablet 1  . KLOR-CON 10 10 MEQ tablet TAKE 1 TABLET BY MOUTH THREE TIMES A DAY 90 tablet 0  . montelukast (SINGULAIR) 10 MG tablet Take 1 tablet (10 mg total) by mouth at bedtime. 90 tablet 0  . naproxen (NAPROSYN) 375 MG tablet TAKE 1 TABLET BY MOUTH TWICE A DAY WITH MEALS 60 tablet 1  . nebivolol (BYSTOLIC) 5 MG tablet Take 1 tablet (5 mg total) by mouth daily. 49 tablet 0  . spironolactone (ALDACTONE) 25 MG tablet Take 1 tablet (25 mg total) by mouth daily. 90 tablet 0   No facility-administered medications prior to visit.     ROS Review of Systems  Constitutional: Negative.  Negative for appetite change, diaphoresis, fatigue and unexpected weight change.  HENT: Negative.   Eyes: Negative for visual disturbance.  Respiratory: Negative for cough, chest tightness, shortness of breath and wheezing.   Cardiovascular: Negative for chest pain, palpitations and leg swelling.  Gastrointestinal: Negative for abdominal pain, constipation, diarrhea,  nausea and vomiting.  Endocrine: Negative.   Genitourinary: Negative.  Negative for difficulty urinating and dysuria.  Musculoskeletal: Positive for arthralgias. Negative for back pain, myalgias and neck pain.  Skin: Negative.  Negative for color change, pallor and rash.  Neurological: Negative.  Negative for dizziness, weakness, light-headedness and headaches.  Hematological: Negative for adenopathy. Does not bruise/bleed easily.  Psychiatric/Behavioral: Negative.     Objective:  BP 120/80 (BP Location: Left Arm, Patient Position: Sitting, Cuff Size: Large)   Pulse 68   Temp 98.3 F (36.8 C) (Oral)   Resp 16   Ht 5\' 3"  (1.6 m)   Wt 212 lb 8 oz (96.4 kg)   LMP 01/18/2019 (Exact Date)   SpO2 98%   BMI 37.64 kg/m   BP Readings from Last 3 Encounters:  02/01/19 120/80  11/20/18 (!) 150/108  11/02/18 138/88    Wt Readings from Last 3 Encounters:  02/01/19 212 lb 8 oz (96.4 kg)  11/20/18 216 lb 4 oz (98.1 kg)  11/02/18 218 lb 1.3 oz (98.9 kg)    Physical Exam Vitals signs reviewed.  Constitutional:      Appearance: She is obese. She is not ill-appearing or diaphoretic.  HENT:     Nose: Nose normal. No congestion or rhinorrhea.     Mouth/Throat:     Pharynx: No oropharyngeal exudate or posterior oropharyngeal erythema.  Eyes:     General:  No scleral icterus.    Conjunctiva/sclera: Conjunctivae normal.  Neck:     Musculoskeletal: Normal range of motion and neck supple. No neck rigidity.  Cardiovascular:     Rate and Rhythm: Normal rate and regular rhythm.     Heart sounds: Normal heart sounds. No murmur. No friction rub. No gallop.   Pulmonary:     Effort: Pulmonary effort is normal. No respiratory distress.     Breath sounds: No stridor. No wheezing, rhonchi or rales.  Abdominal:     General: Abdomen is flat. Bowel sounds are normal. There is no distension.     Palpations: There is no hepatomegaly, splenomegaly or mass.     Tenderness: There is no abdominal  tenderness.  Musculoskeletal: Normal range of motion.        General: No swelling or tenderness.     Right lower leg: No edema.     Left lower leg: No edema.  Lymphadenopathy:     Cervical: No cervical adenopathy.  Skin:    General: Skin is warm and dry.     Coloration: Skin is not pale.     Findings: No erythema or rash.  Neurological:     General: No focal deficit present.     Mental Status: She is oriented to person, place, and time. Mental status is at baseline.     Lab Results  Component Value Date   WBC 9.8 11/20/2018   HGB 13.2 11/20/2018   HCT 39.2 11/20/2018   PLT 287.0 11/20/2018   GLUCOSE 111 (H) 02/01/2019   CHOL 193 11/20/2018   TRIG 155.0 (H) 11/20/2018   HDL 41.00 11/20/2018   LDLDIRECT 99.0 12/21/2016   LDLCALC 121 (H) 11/20/2018   ALT 14 11/20/2018   AST 12 11/20/2018   NA 138 02/01/2019   K 4.1 02/01/2019   CL 105 02/01/2019   CREATININE 0.90 02/01/2019   BUN 12 02/01/2019   CO2 23 02/01/2019   TSH 1.91 11/20/2018   HGBA1C 5.9 11/20/2018    Dg Chest 2 View  Result Date: 04/04/2017 CLINICAL DATA:  Cough, congestion and dyspnea for 1 week. EXAM: CHEST  2 VIEW COMPARISON:  None. FINDINGS: Cardiomediastinal silhouette is normal. No pleural effusions or focal consolidations. Trachea projects midline and there is no pneumothorax. Soft tissue planes and included osseous structures are non-suspicious. IMPRESSION: Normal chest. Electronically Signed   By: Elon Alas M.D.   On: 04/04/2017 16:19    Assessment & Plan:   Jamesha was seen today for hypertension.  Diagnoses and all orders for this visit:  Essential hypertension, benign- Her blood pressure is well controlled.  Electrolytes and renal function are normal now. -     Basic metabolic panel; Future  Diuretic-induced hypokalemia- Her potassium level is normal now. -     Basic metabolic panel; Future  Positive ANA (antinuclear antibody)- I will recheck her ANA titers and will screen her for  lupus. -     Anti-Smith antibody; Future -     Sedimentation rate; Future -     C-reactive protein; Future -     ANA; Future  Arthralgia, unspecified joint- I do not see any evidence of synovitis on her exam today.  Her sed rate and CRP are normal.  I will screen her for rheumatic disease. -     Anti-Smith antibody; Future -     Cyclic citrul peptide antibody, IgG; Future -     Sedimentation rate; Future -     C-reactive protein; Future -  ANA; Future  B12 deficiency -     cyanocobalamin ((VITAMIN B-12)) injection 1,000 mcg   I am having Jocelyne D. Kucher maintain her montelukast, KLOR-CON 10, ferrous sulfate, naproxen, spironolactone, nebivolol, Cholecalciferol, and esomeprazole. We administered cyanocobalamin.  Meds ordered this encounter  Medications  . cyanocobalamin ((VITAMIN B-12)) injection 1,000 mcg     Follow-up: Return in about 6 months (around 08/02/2019).  Scarlette Calico, MD

## 2019-02-04 ENCOUNTER — Encounter: Payer: Self-pay | Admitting: Internal Medicine

## 2019-02-04 LAB — ANTI-NUCLEAR AB-TITER (ANA TITER): ANA Titer 1: 1:80 {titer} — ABNORMAL HIGH

## 2019-02-04 LAB — ANA: Anti Nuclear Antibody(ANA): POSITIVE — AB

## 2019-02-04 LAB — CYCLIC CITRUL PEPTIDE ANTIBODY, IGG: Cyclic Citrullin Peptide Ab: <16 UNITS

## 2019-02-04 LAB — ANTI-SMITH ANTIBODY: ENA SM Ab Ser-aCnc: <1 AI

## 2019-02-06 ENCOUNTER — Other Ambulatory Visit: Payer: Self-pay | Admitting: Internal Medicine

## 2019-02-06 DIAGNOSIS — M2550 Pain in unspecified joint: Secondary | ICD-10-CM

## 2019-02-06 DIAGNOSIS — R768 Other specified abnormal immunological findings in serum: Secondary | ICD-10-CM

## 2019-02-08 ENCOUNTER — Other Ambulatory Visit: Payer: Self-pay | Admitting: Internal Medicine

## 2019-02-08 DIAGNOSIS — I10 Essential (primary) hypertension: Secondary | ICD-10-CM

## 2019-02-08 DIAGNOSIS — T502X5A Adverse effect of carbonic-anhydrase inhibitors, benzothiadiazides and other diuretics, initial encounter: Secondary | ICD-10-CM

## 2019-02-08 DIAGNOSIS — E876 Hypokalemia: Secondary | ICD-10-CM

## 2019-02-08 MED ORDER — SPIRONOLACTONE 25 MG PO TABS: 25.0000 mg | ORAL_TABLET | Freq: Every day | ORAL | 0 refills | Status: DC

## 2019-02-08 MED ORDER — NEBIVOLOL HCL 5 MG PO TABS: 5.0000 mg | ORAL_TABLET | Freq: Every day | ORAL | 3 refills | Status: DC

## 2019-03-02 ENCOUNTER — Other Ambulatory Visit: Payer: Self-pay

## 2019-03-02 ENCOUNTER — Ambulatory Visit (INDEPENDENT_AMBULATORY_CARE_PROVIDER_SITE_OTHER): Payer: 59 | Admitting: *Deleted

## 2019-03-02 DIAGNOSIS — E538 Deficiency of other specified B group vitamins: Secondary | ICD-10-CM

## 2019-03-02 MED ORDER — CYANOCOBALAMIN 1000 MCG/ML IJ SOLN
1000.0000 ug | Freq: Once | INTRAMUSCULAR | Status: AC
  Administered 2019-03-02: 1000 ug via INTRAMUSCULAR

## 2019-03-02 NOTE — Progress Notes (Signed)
b12 Injection given.   Samantha Rooke J Chrislynn Mosely, MD  

## 2019-03-22 ENCOUNTER — Other Ambulatory Visit: Payer: Self-pay | Admitting: Internal Medicine

## 2019-03-22 DIAGNOSIS — D508 Other iron deficiency anemias: Secondary | ICD-10-CM

## 2019-04-03 ENCOUNTER — Ambulatory Visit: Payer: 59 | Admitting: Rheumatology

## 2019-04-03 ENCOUNTER — Other Ambulatory Visit: Payer: Self-pay | Admitting: Internal Medicine

## 2019-04-03 DIAGNOSIS — E876 Hypokalemia: Secondary | ICD-10-CM

## 2019-04-03 DIAGNOSIS — T502X5A Adverse effect of carbonic-anhydrase inhibitors, benzothiadiazides and other diuretics, initial encounter: Principal | ICD-10-CM

## 2019-04-04 ENCOUNTER — Other Ambulatory Visit: Payer: Self-pay | Admitting: Internal Medicine

## 2019-04-05 ENCOUNTER — Other Ambulatory Visit: Payer: Self-pay | Admitting: Internal Medicine

## 2019-04-05 DIAGNOSIS — D508 Other iron deficiency anemias: Secondary | ICD-10-CM

## 2019-04-06 ENCOUNTER — Other Ambulatory Visit: Payer: Self-pay | Admitting: Internal Medicine

## 2019-04-06 DIAGNOSIS — D508 Other iron deficiency anemias: Secondary | ICD-10-CM

## 2019-04-06 MED ORDER — FERROUS SULFATE 325 (65 FE) MG PO TABS: ORAL_TABLET | ORAL | 1 refills | Status: DC

## 2019-04-25 ENCOUNTER — Other Ambulatory Visit: Payer: Self-pay | Admitting: Internal Medicine

## 2019-04-27 NOTE — Progress Notes (Signed)
Office Visit Note  Patient: Samantha Ball             Date of Birth: 1976/01/07           MRN: [id number]             PCP: Samantha Lima, MD Referring: Samantha Lima, MD Visit Date: 05/01/2019 Occupation: Logistics co-ordinator  Subjective:  Pain in multiple joints.   History of Present Illness: Samantha Ball is a 43 y.o. female is seen in consultation per request of her PCP.  According to patient she has had history of joint pain for many years.  She states she had knee joint tendinitis which she relates to being in [place] in the past.  Her joints click all the time and make popping sounds.  She states in the morning she has a lot of popping in her joints.  She states at times her hands feels tight but she has not noticed joint swelling.  She has difficulty getting up from the squatting position.  She describes pain and discomfort in her both wrist, both hands, left trochanter, both knees, both ankles and feet.  She states she recently developed some redness on her face and a area of patchy alopecia.  She was seen by dermatologist yesterday.  She was told that she has heat damage from the tools she used for straightening her hair.  She was also given a topical cream to use on her face.  She does not know the diagnosis of the name of the cream.  Activities of Daily Living:  Patient reports morning stiffness for 30 minutes.   Patient Reports nocturnal pain.  Over left trochanter. Difficulty dressing/grooming: Denies Difficulty climbing stairs: Reports Difficulty getting out of chair: Reports Difficulty using hands for taps, buttons, cutlery, and/or writing: Denies  Review of Systems  Constitutional: Positive for fatigue. Negative for night sweats, weight gain and weight loss.  HENT: Positive for mouth sores. Negative for trouble swallowing, trouble swallowing, mouth dryness and nose dryness.   Eyes: Negative for pain, redness, visual disturbance and dryness.  Respiratory:  Negative for cough, shortness of breath and difficulty breathing.   Cardiovascular: Negative for chest pain, palpitations, hypertension, irregular heartbeat and swelling in legs/feet.  Gastrointestinal: Negative for blood in stool, constipation and diarrhea.  Endocrine: Negative for increased urination.  Genitourinary: Negative for vaginal dryness.  Musculoskeletal: Positive for arthralgias, joint pain and morning stiffness. Negative for joint swelling, myalgias, muscle weakness, muscle tenderness and myalgias.  Skin: Positive for rash and hair loss. Negative for color change, skin tightness, ulcers and sensitivity to sunlight.       Patch of alopecia Rash on face  Allergic/Immunologic: Negative for susceptible to infections.  Neurological: Negative for dizziness, memory loss, night sweats and weakness.  Hematological: Negative for swollen glands.  Psychiatric/Behavioral: Positive for sleep disturbance. Negative for depressed mood. The patient is not nervous/anxious.     PMFS History:  Patient Active Problem List   Diagnosis Date Noted  . Arthralgia 02/01/2019  . Vitamin D deficiency disease 11/21/2018  . B12 deficiency 11/21/2018  . Perennial allergic rhinitis 03/06/2018  . Prediabetes 01/18/2018  . Seasonal allergic rhinitis due to pollen 05/09/2017  . Hyperlipidemia LDL goal <130 12/21/2016  . Snoring 11/09/2016  . Other iron deficiency anemias 12/17/2014  . Insomnia 01/09/2014  . GERD (gastroesophageal reflux disease) 01/09/2014  . Constipation 01/09/2014  . Diuretic-induced hypokalemia 01/09/2014  . Positive ANA (antinuclear antibody) 07/16/2013  . Essential hypertension, benign  07/11/2013  . Visit for screening mammogram 07/11/2013  . Obesity (BMI 30.0-34.9) 07/11/2013    Past Medical History:  Diagnosis Date  . Anemia   . Hyperlipidemia   . Hypertension   . Obesity   . Sleep apnea   . Urticaria     Family History  Problem Relation Age of Onset  . Heart disease  Father   . Diabetes Maternal Aunt   . Breast cancer Maternal Grandmother   . Allergic rhinitis Daughter   . Eczema Daughter   . Alcohol abuse Neg Hx   . COPD Neg Hx   . Depression Neg Hx   . Drug abuse Neg Hx   . Early death Neg Hx   . Hearing loss Neg Hx   . Hyperlipidemia Neg Hx   . Hypertension Neg Hx   . Kidney disease Neg Hx   . Stroke Neg Hx   . Colon cancer Neg Hx   . Rectal cancer Neg Hx   . Stomach cancer Neg Hx    Past Surgical History:  Procedure Laterality Date  . TUBAL LIGATION    . wisdom teeth  2000   Social History   Social History Narrative  . Not on file   Immunization History  Administered Date(s) Administered  . Influenza,inj,Quad PF,6+ Mos 02/04/2016, 11/09/2016, 01/18/2018, 11/20/2018  . Influenza-Unspecified 01/09/2014  . Tdap 04/25/2013     Objective: Vital Signs: BP 121/86 (BP Location: Right Arm, Patient Position: Sitting, Cuff Size: Normal)   Pulse 63   Resp 12   Ht 5' 3.5" (1.613 m)   Wt 215 lb 8 oz (97.8 kg)   BMI 37.58 kg/m    Physical Exam Vitals signs and nursing note reviewed.  Constitutional:      Appearance: She is well-developed.  HENT:     Head: Normocephalic and atraumatic.  Eyes:     Conjunctiva/sclera: Conjunctivae normal.  Neck:     Musculoskeletal: Normal range of motion.  Cardiovascular:     Rate and Rhythm: Normal rate and regular rhythm.     Heart sounds: Normal heart sounds.  Pulmonary:     Effort: Pulmonary effort is normal.     Breath sounds: Normal breath sounds.  Abdominal:     General: Bowel sounds are normal.     Palpations: Abdomen is soft.  Lymphadenopathy:     Cervical: No cervical adenopathy.  Skin:    General: Skin is warm and dry.     Capillary Refill: Capillary refill takes less than 2 seconds.     Comments: Mild facial erythema noted.  Neurological:     Mental Status: She is alert and oriented to person, place, and time.  Psychiatric:        Behavior: Behavior normal.       Musculoskeletal Exam: C-spine thoracic and lumbar spine good range of motion.  Shoulder joints elbow joints wrist joint MCPs PIPs DIPs with good range of motion with no synovitis.  She has some tenderness on palpation of her PIP joints.  Hip joints were in good range of motion.  She has discomfort range of motion of her knee joints and had difficulty getting up from the squatting position.  No swelling was noted over her ankle joints or feet.  CDAI Exam: CDAI Score: Not documented Patient Global Assessment: Not documented; Provider Global Assessment: Not documented Swollen: 0 ; Tender: 0  Joint Exam   Not documented   There is currently no information documented on the homunculus. Go to the  Rheumatology activity and complete the homunculus joint exam.  Investigation: No additional findings. Component     Latest Ref Rng & Units 02/01/2019  Anti Nuclear Antibody (ANA)     NEGATIVE POSITIVE (A)  CRP     0.5 - 20.0 mg/dL <1.0  Sed Rate     0 - 20 mm/hr 12  Cyclic Citrullin Peptide Ab     UNITS <16  ENA SM Ab Ser-aCnc     <1.0 NEG AI <1.0 NEG   Imaging: No results found.  Recent Labs: Lab Results  Component Value Date   WBC 9.8 11/20/2018   HGB 13.2 11/20/2018   PLT 287.0 11/20/2018   NA 138 02/01/2019   K 4.1 02/01/2019   CL 105 02/01/2019   CO2 23 02/01/2019   GLUCOSE 111 (H) 02/01/2019   BUN 12 02/01/2019   CREATININE 0.90 02/01/2019   BILITOT 0.4 11/20/2018   ALKPHOS 62 11/20/2018   AST 12 11/20/2018   ALT 14 11/20/2018   PROT 7.8 11/20/2018   ALBUMIN 4.5 11/20/2018   CALCIUM 9.7 02/01/2019   GFRAA 105 03/06/2018    Speciality Comments: No specialty comments available.  Procedures:  No procedures performed Allergies: Amlodipine   Assessment / Plan:     Visit Diagnoses: Polyarthralgia -patient complains of pain in multiple joints.  She denies any history of swelling.  She states a lot of popping in her joints.  The symptoms have been going on for some time.   She also complains of some facial erythema and patchy alopecia for which she was seen by dermatologist yesterday.  She states she was told the hair loss is due to heat damage from the tools she has been using.  She was also giving some topical ointment she does not know the name of.  02/01/19: ANA 1:80 NS, CRP <1, sed rate 12, CCP 16, smith -  Positive ANA (antinuclear antibody)-ANA is low titer and not significant.  I will obtain complete AVISE panel.  Pain in both hands -she complains of stiffness in her hands but no synovitis was noted.  Plan: XR Hand 2 View Right, XR Hand 2 View Left.  X-rays of bilateral hands shows mild osteoarthritic changes only.  Trochanteric bursitis of left hip-she has symptoms of left trochanteric bursitis.  I have given her a handout on IT band exercises.  Chronic pain of both knees -she complains of pain and discomfort in her knee joints.  No warmth swelling or effusion was noted.  Plan: XR KNEE 3 VIEW RIGHT, XR KNEE 3 VIEW LEFT.  X-rays were consistent with chondromalacia patella and mild osteoarthritis.  Handout on knee exercises was given.  Essential hypertension, benign-her systolic blood pressure is mildly elevated today.  Prediabetes  Vitamin D deficiency-her vitamin D was 15.62 in December.  She states she has been taking supplement.  B12 deficiency  Hyperlipidemia LDL goal <130  Other insomnia   Orders: Orders Placed This Encounter  Procedures  . XR Hand 2 View Right  . XR Hand 2 View Left  . XR KNEE 3 VIEW RIGHT  . XR KNEE 3 VIEW LEFT   No orders of the defined types were placed in this encounter.   Face-to-face time spent with patient was 50 minutes. Greater than 50% of time was spent in counseling and coordination of care.  Follow-Up Instructions: Return for Positive ANA and arthralgia.   Bo Merino, MD  Note - This record has been created using Editor, commissioning.  Chart creation errors  have been sought, but may not always  have  been located. Such creation errors do not reflect on  the standard of medical care.

## 2019-04-30 DIAGNOSIS — L638 Other alopecia areata: Secondary | ICD-10-CM | POA: Diagnosis not present

## 2019-04-30 DIAGNOSIS — L218 Other seborrheic dermatitis: Secondary | ICD-10-CM | POA: Diagnosis not present

## 2019-05-01 ENCOUNTER — Ambulatory Visit: Payer: Self-pay

## 2019-05-01 ENCOUNTER — Encounter: Payer: Self-pay | Admitting: Rheumatology

## 2019-05-01 ENCOUNTER — Ambulatory Visit: Payer: 59 | Admitting: Rheumatology

## 2019-05-01 ENCOUNTER — Other Ambulatory Visit: Payer: Self-pay

## 2019-05-01 VITALS — BP 121/86 | HR 63 | Resp 12 | Ht 63.5 in | Wt 215.5 lb

## 2019-05-01 DIAGNOSIS — M79642 Pain in left hand: Secondary | ICD-10-CM

## 2019-05-01 DIAGNOSIS — R768 Other specified abnormal immunological findings in serum: Secondary | ICD-10-CM | POA: Diagnosis not present

## 2019-05-01 DIAGNOSIS — E559 Vitamin D deficiency, unspecified: Secondary | ICD-10-CM

## 2019-05-01 DIAGNOSIS — G8929 Other chronic pain: Secondary | ICD-10-CM

## 2019-05-01 DIAGNOSIS — I10 Essential (primary) hypertension: Secondary | ICD-10-CM

## 2019-05-01 DIAGNOSIS — G4709 Other insomnia: Secondary | ICD-10-CM

## 2019-05-01 DIAGNOSIS — M25562 Pain in left knee: Secondary | ICD-10-CM

## 2019-05-01 DIAGNOSIS — R7303 Prediabetes: Secondary | ICD-10-CM

## 2019-05-01 DIAGNOSIS — M7062 Trochanteric bursitis, left hip: Secondary | ICD-10-CM

## 2019-05-01 DIAGNOSIS — M2550 Pain in unspecified joint: Secondary | ICD-10-CM | POA: Diagnosis not present

## 2019-05-01 DIAGNOSIS — E785 Hyperlipidemia, unspecified: Secondary | ICD-10-CM

## 2019-05-01 DIAGNOSIS — M25561 Pain in right knee: Secondary | ICD-10-CM

## 2019-05-01 DIAGNOSIS — E538 Deficiency of other specified B group vitamins: Secondary | ICD-10-CM

## 2019-05-01 DIAGNOSIS — M79641 Pain in right hand: Secondary | ICD-10-CM | POA: Diagnosis not present

## 2019-05-01 NOTE — Patient Instructions (Signed)
Knee Exercises Ask your health care provider which exercises are safe for you. Do exercises exactly as told by your health care provider and adjust them as directed. It is normal to feel mild stretching, pulling, tightness, or discomfort as you do these exercises, but you should stop right away if you feel sudden pain or your pain gets worse.Do not begin these exercises until told by your health care provider. STRETCHING AND RANGE OF MOTION EXERCISES These exercises warm up your muscles and joints and improve the movement and flexibility of your knee. These exercises also help to relieve pain, numbness, and tingling. Exercise A: Knee Extension, Prone 1. Lie on your abdomen on a bed. 2. Place your left / right knee just beyond the edge of the surface so your knee is not on the bed. You can put a towel under your left / right thigh just above your knee for comfort. 3. Relax your leg muscles and allow gravity to straighten your knee. You should feel a stretch behind your left / right knee. 4. Hold this position for __________ seconds. 5. Scoot up so your knee is supported between repetitions. Repeat __________ times. Complete this stretch __________ times a day. Exercise B: Knee Flexion, Active  1. Lie on your back with both knees straight. If this causes back discomfort, bend your left / right knee so your foot is flat on the floor. 2. Slowly slide your left / right heel back toward your buttocks until you feel a gentle stretch in the front of your knee or thigh. 3. Hold this position for __________ seconds. 4. Slowly slide your left / right heel back to the starting position. Repeat __________ times. Complete this exercise __________ times a day. Exercise C: Quadriceps, Prone  1. Lie on your abdomen on a firm surface, such as a bed or padded floor. 2. Bend your left / right knee and hold your ankle. If you cannot reach your ankle or pant leg, loop a belt around your foot and grab the belt  instead. 3. Gently pull your heel toward your buttocks. Your knee should not slide out to the side. You should feel a stretch in the front of your thigh and knee. 4. Hold this position for __________ seconds. Repeat __________ times. Complete this stretch __________ times a day. Exercise D: Hamstring, Supine 1. Lie on your back. 2. Loop a belt or towel over the ball of your left / right foot. The ball of your foot is on the walking surface, right under your toes. 3. Straighten your left / right knee and slowly pull on the belt to raise your leg until you feel a gentle stretch behind your knee. ? Do not let your left / right knee bend while you do this. ? Keep your other leg flat on the floor. 4. Hold this position for __________ seconds. Repeat __________ times. Complete this stretch __________ times a day. STRENGTHENING EXERCISES These exercises build strength and endurance in your knee. Endurance is the ability to use your muscles for a long time, even after they get tired. Exercise E: Quadriceps, Isometric  1. Lie on your back with your left / right leg extended and your other knee bent. Put a rolled towel or small pillow under your knee if told by your health care provider. 2. Slowly tense the muscles in the front of your left / right thigh. You should see your kneecap slide up toward your hip or see increased dimpling just above the knee. This   motion will push the back of the knee toward the floor. 3. For __________ seconds, keep the muscle as tight as you can without increasing your pain. 4. Relax the muscles slowly and completely. Repeat __________ times. Complete this exercise __________ times a day. Exercise F: Straight Leg Raises - Quadriceps 1. Lie on your back with your left / right leg extended and your other knee bent. 2. Tense the muscles in the front of your left / right thigh. You should see your kneecap slide up or see increased dimpling just above the knee. Your thigh may  even shake a bit. 3. Keep these muscles tight as you raise your leg 4-6 inches (10-15 cm) off the floor. Do not let your knee bend. 4. Hold this position for __________ seconds. 5. Keep these muscles tense as you lower your leg. 6. Relax your muscles slowly and completely after each repetition. Repeat __________ times. Complete this exercise __________ times a day. Exercise G: Hamstring, Isometric 1. Lie on your back on a firm surface. 2. Bend your left / right knee approximately __________ degrees. 3. Dig your left / right heel into the surface as if you are trying to pull it toward your buttocks. Tighten the muscles in the back of your thighs to dig as hard as you can without increasing any pain. 4. Hold this position for __________ seconds. 5. Release the tension gradually and allow your muscles to relax completely for __________ seconds after each repetition. Repeat __________ times. Complete this exercise __________ times a day. Exercise H: Hamstring Curls  If told by your health care provider, do this exercise while wearing ankle weights. Begin with __________ weights. Then increase the weight by 1 lb (0.5 kg) increments. Do not wear ankle weights that are more than __________. 1. Lie on your abdomen with your legs straight. 2. Bend your left / right knee as far as you can without feeling pain. Keep your hips flat against the floor. 3. Hold this position for __________ seconds. 4. Slowly lower your leg to the starting position.  Repeat __________ times. Complete this exercise __________ times a day. Exercise I: Squats (Quadriceps) 1. Stand in front of a table, with your feet and knees pointing straight ahead. You may rest your hands on the table for balance but not for support. 2. Slowly bend your knees and lower your hips like you are going to sit in a chair. ? Keep your weight over your heels, not over your toes. ? Keep your lower legs upright so they are parallel with the table  legs. ? Do not let your hips go lower than your knees. ? Do not bend lower than told by your health care provider. ? If your knee pain increases, do not bend as low. 3. Hold the squat position for __________ seconds. 4. Slowly push with your legs to return to standing. Do not use your hands to pull yourself to standing. Repeat __________ times. Complete this exercise __________ times a day. Exercise J: Wall Slides (Quadriceps)  1. Lean your back against a smooth wall or door while you walk your feet out 18-24 inches (46-61 cm) from it. 2. Place your feet hip-width apart. 3. Slowly slide down the wall or door until your knees bend __________ degrees. Keep your knees over your heels, not over your toes. Keep your knees in line with your hips. 4. Hold for __________ seconds. Repeat __________ times. Complete this exercise __________ times a day. Exercise K: Straight Leg Raises -   Hip Abductors 1. Lie on your side with your left / right leg in the top position. Lie so your head, shoulder, knee, and hip line up. You may bend your bottom knee to help you keep your balance. 2. Roll your hips slightly forward so your hips are stacked directly over each other and your left / right knee is facing forward. 3. Leading with your heel, lift your top leg 4-6 inches (10-15 cm). You should feel the muscles in your outer hip lifting. ? Do not let your foot drift forward. ? Do not let your knee roll toward the ceiling. 4. Hold this position for __________ seconds. 5. Slowly return your leg to the starting position. 6. Let your muscles relax completely after each repetition. Repeat __________ times. Complete this exercise __________ times a day. Exercise L: Straight Leg Raises - Hip Extensors 1. Lie on your abdomen on a firm surface. You can put a pillow under your hips if that is more comfortable. 2. Tense the muscles in your buttocks and lift your left / right leg about 4-6 inches (10-15 cm). Keep your knee  straight as you lift your leg. 3. Hold this position for __________ seconds. 4. Slowly lower your leg to the starting position. 5. Let your leg relax completely after each repetition. Repeat __________ times. Complete this exercise __________ times a day. This information is not intended to replace advice given to you by your health care provider. Make sure you discuss any questions you have with your health care provider. Document Released: 10/20/2005 Document Revised: 08/30/2016 Document Reviewed: 10/12/2015 Elsevier Interactive Patient Education  2018 [place] [facility] Ask your health care provider which exercises are safe for you. Do exercises exactly as told by your health care provider and adjust them as directed. It is normal to feel mild stretching, pulling, tightness, or discomfort as you do these exercises, but you should stop right away if you feel sudden pain or your pain gets worse.Do not begin these exercises until told by your health care provider. Stretching and range of motion exercises These exercises warm up your muscles and joints and improve the movement and flexibility of your hip and pelvis. Exercise A: Quadriceps, prone  1. Lie on your abdomen on a firm surface, such as a bed or padded floor. 2. Bend your left / right knee and hold your ankle. If you cannot reach your ankle or pant leg, loop a belt around your foot and grab the belt instead. 3. Gently pull your heel toward your buttocks. Your knee should not slide out to the side. You should feel a stretch in the front of your thigh and knee. 4. Hold this position for __________ seconds. Repeat __________ times. Complete this stretch __________ times a day. Exercise B: Iliotibial band  1. Lie on your side with your left / right leg in the top position. 2. Bend both of your knees and grab your left / right ankle. Stretch out your bottom arm to help you balance. 3. Slowly bring your top knee  back so your thigh goes behind your trunk. 4. Slowly lower your top leg toward the floor until you feel a gentle stretch on the outside of your left / right hip and thigh. If you do not feel a stretch and your knee will not fall farther, place the heel of your other foot on top of your knee and pull your knee down toward the floor with your foot. 5. Hold this position  for __________ seconds. Repeat __________ times. Complete this stretch __________ times a day. Strengthening exercises These exercises build strength and endurance in your hip and pelvis. Endurance is the ability to use your muscles for a long time, even after they get tired. Exercise C: Straight leg raises (hip abductors)  1. Lie on your side with your left / right leg in the top position. Lie so your head, shoulder, knee, and hip line up. You may bend your bottom knee to help you balance. 2. Roll your hips slightly forward so your hips are stacked directly over each other and your left / right knee is facing forward. 3. Tense the muscles in your outer thigh and lift your top leg 4-6 inches (10-15 cm). 4. Hold this position for __________ seconds. 5. Slowly return to the starting position. Let your muscles relax completely before doing another repetition. Repeat __________ times. Complete this exercise __________ times a day. Exercise D: Straight leg raises (hip extensors) 1. Lie on your abdomen on your bed or a firm surface. You can put a pillow under your hips if that is more comfortable. 2. Bend your left / right knee so your foot is straight up in the air. 3. Squeeze your buttock muscles and lift your left / right thigh off the bed. Do not let your back arch. 4. Tense this muscle as hard as you can without increasing any knee pain. 5. Hold this position for __________ seconds. 6. Slowly lower your leg to the starting position and allow it to relax completely. Repeat __________ times. Complete this exercise __________ times a  day. Exercise E: Hip hike 1. Stand sideways on a bottom step. Stand on your left / right leg with your other foot unsupported next to the step. You can hold onto the railing or wall if needed for balance. 2. Keep your knees straight and your torso square. Then, lift your left / right hip up toward the ceiling. 3. Slowly let your left / right hip lower toward the floor, past the starting position. Your foot should get closer to the floor. Do not lean or bend your knees. Repeat __________ times. Complete this exercise __________ times a day. This information is not intended to replace advice given to you by your health care provider. Make sure you discuss any questions you have with your health care provider. Document Released: 12/06/2005 Document Revised: 08/10/2016 Document Reviewed: 11/07/2015 Elsevier Interactive Patient Education  2019 [employer].

## 2019-05-03 DIAGNOSIS — R768 Other specified abnormal immunological findings in serum: Secondary | ICD-10-CM | POA: Diagnosis not present

## 2019-05-03 DIAGNOSIS — M2550 Pain in unspecified joint: Secondary | ICD-10-CM | POA: Diagnosis not present

## 2019-05-08 ENCOUNTER — Other Ambulatory Visit: Payer: Self-pay | Admitting: Internal Medicine

## 2019-05-08 DIAGNOSIS — E876 Hypokalemia: Secondary | ICD-10-CM

## 2019-05-08 DIAGNOSIS — T502X5A Adverse effect of carbonic-anhydrase inhibitors, benzothiadiazides and other diuretics, initial encounter: Secondary | ICD-10-CM

## 2019-05-08 DIAGNOSIS — I10 Essential (primary) hypertension: Secondary | ICD-10-CM

## 2019-05-16 NOTE — Progress Notes (Signed)
Office Visit Note  Patient: Samantha Ball             Date of Birth: 03/20/1976           MRN: [id number]             PCP: [name], MD Referring: [name], MD Visit Date: 05/25/2019 Occupation: @GUAROCC @  Subjective:  Joint stiffness.   History of Present Illness: Samantha Ball is a 43 y.o. female patient complains of some joint stiffness but no joint swelling.  She states the pain moves around.  She had some discomfort in her right elbow which is gone now.  She has a stiffness in her hands.  She also has discomfort in her knee joints.  The left trochanteric bursitis has been better.  She denies any history of oral ulcers, nasal ulcers, malar rash, photosensitivity, Raynaud's phenomenon or arthritis.  Activities of Daily Living:  Patient reports morning stiffness for 30 minutes.   Patient Reports nocturnal pain.  Difficulty dressing/grooming: Denies Difficulty climbing stairs: Reports Difficulty getting out of chair: Reports Difficulty using hands for taps, buttons, cutlery, and/or writing: Denies  Review of Systems  Constitutional: Positive for fatigue. Negative for night sweats, weight gain and weight loss.  HENT: Positive for mouth dryness. Negative for mouth sores, trouble swallowing, trouble swallowing and nose dryness.   Eyes: Negative for pain, redness, visual disturbance and dryness.  Respiratory: Negative for cough, shortness of breath, wheezing and difficulty breathing.   Cardiovascular: Negative for chest pain, palpitations, hypertension, irregular heartbeat and swelling in legs/feet.  Gastrointestinal: Positive for constipation. Negative for abdominal pain, blood in stool and diarrhea.  Endocrine: Negative for excessive thirst and increased urination.  Genitourinary: Negative for difficulty urinating and vaginal dryness.  Musculoskeletal: Positive for arthralgias, joint pain, muscle weakness, morning stiffness and muscle tenderness. Negative for joint  swelling, myalgias and myalgias.  Skin: Negative for color change, rash, hair loss, redness, skin tightness, ulcers and sensitivity to sunlight.  Allergic/Immunologic: Negative for susceptible to infections.  Neurological: Negative for dizziness, headaches, memory loss, night sweats and weakness.  Hematological: Negative for bruising/bleeding tendency and swollen glands.  Psychiatric/Behavioral: Positive for sleep disturbance. Negative for depressed mood. The patient is not nervous/anxious.     PMFS History:  Patient Active Problem List   Diagnosis Date Noted   Arthralgia 02/01/2019   Vitamin D deficiency disease 11/21/2018   B12 deficiency 11/21/2018   Perennial allergic rhinitis 03/06/2018   Prediabetes 01/18/2018   Seasonal allergic rhinitis due to pollen 05/09/2017   Hyperlipidemia LDL goal <130 12/21/2016   Snoring 11/09/2016   Other iron deficiency anemias 12/17/2014   Insomnia 01/09/2014   GERD (gastroesophageal reflux disease) 01/09/2014   Constipation 01/09/2014   Diuretic-induced hypokalemia 01/09/2014   Positive ANA (antinuclear antibody) 07/16/2013   Essential hypertension, benign 07/11/2013   Visit for screening mammogram 07/11/2013   Obesity (BMI 30.0-34.9) 07/11/2013    Past Medical History:  Diagnosis Date   Anemia    Hyperlipidemia    Hypertension    Obesity    Sleep apnea    Urticaria     Family History  Problem Relation Age of Onset   Heart disease Father    Diabetes Maternal Aunt    Breast cancer Maternal Grandmother    Allergic rhinitis Daughter    Eczema Daughter    Alcohol abuse Neg Hx    COPD Neg Hx    Depression Neg Hx    Drug abuse Neg Hx  Early death Neg Hx    Hearing loss Neg Hx    Hyperlipidemia Neg Hx    Hypertension Neg Hx    Kidney disease Neg Hx    Stroke Neg Hx    Colon cancer Neg Hx    Rectal cancer Neg Hx    Stomach cancer Neg Hx    Past Surgical History:  Procedure Laterality  Date   TUBAL LIGATION     wisdom teeth  2000   Social History   Social History Narrative   Not on file   Immunization History  Administered Date(s) Administered   Influenza,inj,Quad PF,6+ Mos 02/04/2016, 11/09/2016, 01/18/2018, 11/20/2018   Influenza-Unspecified 01/09/2014   Tdap 04/25/2013     Objective: Vital Signs: BP 127/80 (BP Location: Left Arm, Patient Position: Sitting, Cuff Size: Normal)    Pulse (!) 59    Resp 12    Ht 5' 3.5" (1.613 m)    Wt 217 lb (98.4 kg)    LMP 04/29/2019    BMI 37.84 kg/m    Physical Exam Vitals signs and nursing note reviewed.  Constitutional:      Appearance: She is well-developed.  HENT:     Head: Normocephalic and atraumatic.  Eyes:     Conjunctiva/sclera: Conjunctivae normal.  Neck:     Musculoskeletal: Normal range of motion.  Cardiovascular:     Rate and Rhythm: Normal rate and regular rhythm.     Heart sounds: Normal heart sounds.  Pulmonary:     Effort: Pulmonary effort is normal.     Breath sounds: Normal breath sounds.  Abdominal:     General: Bowel sounds are normal.     Palpations: Abdomen is soft.  Lymphadenopathy:     Cervical: No cervical adenopathy.  Skin:    General: Skin is warm and dry.     Capillary Refill: Capillary refill takes less than 2 seconds.  Neurological:     Mental Status: She is alert and oriented to person, place, and time.  Psychiatric:        Behavior: Behavior normal.      Musculoskeletal Exam: C-spine thoracic and lumbar spine good range of motion.  Shoulder joints elbow joints wrist joint MCPs PIPs DIPs been good range of motion with no synovitis.  She had mild tenderness over the PIP joints.  Hip joints, knee joints, ankles MTPs PIPs with good range of motion with no synovitis.  She had mild tenderness over left trochanteric bursa.  CDAI Exam: CDAI Score: Not documented Patient Global Assessment: Not documented; Provider Global Assessment: Not documented Swollen: Not documented;  Tender: Not documented Joint Exam   Not documented   There is currently no information documented on the homunculus. Go to the Rheumatology activity and complete the homunculus joint exam.  Investigation: No additional findings.  Imaging: Xr Hand 2 View Left  Result Date: 05/01/2019 Mild PIP narrowing was noted.  No MCP, intercarpal or radiocarpal joint space narrowing was noted.  No erosive changes were noted. Impression: These findings are consistent with mild osteoarthritis of the hand.  Xr Hand 2 View Right  Result Date: 05/01/2019 Mild PIP narrowing was noted.  No MCP, intercarpal or radiocarpal joint space narrowing was noted.  No erosive changes were noted. Impression: These findings are consistent with mild osteoarthritis of the hand.  Xr Knee 3 View Left  Result Date: 05/01/2019 Mild medial compartment narrowing was noted.  Moderate patellofemoral narrowing was noted.  No chondrocalcinosis was noted. Impression: These findings are consistent with mild  osteoarthritis and moderate chondromalacia patella.  Xr Knee 3 View Right  Result Date: 05/01/2019 Mild medial compartment narrowing was noted.  Moderate patellofemoral narrowing was noted.  No chondrocalcinosis was noted. Impression: These findings are consistent with mild osteoarthritis and moderate chondromalacia patella.   Recent Labs: Lab Results  Component Value Date   WBC 9.8 11/20/2018   HGB 13.2 11/20/2018   PLT 287.0 11/20/2018   NA 138 02/01/2019   K 4.1 02/01/2019   CL 105 02/01/2019   CO2 23 02/01/2019   GLUCOSE 111 (H) 02/01/2019   BUN 12 02/01/2019   CREATININE 0.90 02/01/2019   BILITOT 0.4 11/20/2018   ALKPHOS 62 11/20/2018   AST 12 11/20/2018   ALT 14 11/20/2018   PROT 7.8 11/20/2018   ALBUMIN 4.5 11/20/2018   CALCIUM 9.7 02/01/2019   GFRAA 105 03/06/2018    Speciality Comments: No specialty comments available.  Procedures:  No procedures performed Allergies: Amlodipine   Assessment /  Plan:     Visit Diagnoses: Positive ANA (antinuclear antibody) - AVISE: ANA 1: 320 speckled, ENA negative, CB CAP negative, antiphospholipid negative, beta-2 GP 1-, thyroid antibodies negative.  Patient has low titer ANA and all other autoimmune labs were negative.  We had detailed discussion regarding her device labs.  RF and anti-CCP antibodies were negative as well.  Primary osteoarthritis of both hands -she complains of some stiffness and discomfort in her hands.  Radiographic findings were consistent with mild osteoarthritis.  Joint protection muscle strengthening was discussed.  Primary osteoarthritis of both knees - Mild osteoarthritis and moderate chondromalacia patella.  Weight loss diet and exercise was discussed.  Have given her a handout on knee exercises.  A list of natural anti-inflammatories was given.  Trochanteric bursitis of left hip-her symptoms have improved compared to last visit.  IT band exercises were demonstrated and handout was given.  Other insomnia-sleep hygiene was discussed.  Vitamin D deficiency-patient has been taking vitamin D supplement.  Essential hypertension, benign-blood pressure is better controlled.  Hyperlipidemia LDL goal <130  Prediabetes  B12 deficiency   Orders: No orders of the defined types were placed in this encounter.  No orders of the defined types were placed in this encounter.   Face-to-face time spent with patient was 30 minutes. Greater than 50% of time was spent in counseling and coordination of care.  Follow-Up Instructions: Return in about 1 year (around 05/24/2020), or if symptoms worsen or fail to improve, for Osteoarthritis.   Bo Merino, MD  Note - This record has been created using Editor, commissioning.  Chart creation errors have been sought, but may not always  have been located. Such creation errors do not reflect on  the standard of medical care.

## 2019-05-17 ENCOUNTER — Ambulatory Visit (INDEPENDENT_AMBULATORY_CARE_PROVIDER_SITE_OTHER): Payer: 59

## 2019-05-17 DIAGNOSIS — E538 Deficiency of other specified B group vitamins: Secondary | ICD-10-CM

## 2019-05-17 MED ORDER — CYANOCOBALAMIN 1000 MCG/ML IJ SOLN
1000.0000 ug | Freq: Once | INTRAMUSCULAR | Status: AC
  Administered 2019-05-17: 1000 ug via INTRAMUSCULAR

## 2019-05-17 NOTE — Progress Notes (Signed)
I have reviewed and agree.

## 2019-05-19 ENCOUNTER — Other Ambulatory Visit: Payer: Self-pay | Admitting: Internal Medicine

## 2019-05-19 DIAGNOSIS — E559 Vitamin D deficiency, unspecified: Secondary | ICD-10-CM

## 2019-05-25 ENCOUNTER — Other Ambulatory Visit: Payer: Self-pay

## 2019-05-25 ENCOUNTER — Ambulatory Visit: Payer: 59 | Admitting: Rheumatology

## 2019-05-25 ENCOUNTER — Encounter: Payer: Self-pay | Admitting: Rheumatology

## 2019-05-25 VITALS — BP 127/80 | HR 59 | Resp 12 | Ht 63.5 in | Wt 217.0 lb

## 2019-05-25 DIAGNOSIS — E538 Deficiency of other specified B group vitamins: Secondary | ICD-10-CM

## 2019-05-25 DIAGNOSIS — M170 Bilateral primary osteoarthritis of knee: Secondary | ICD-10-CM

## 2019-05-25 DIAGNOSIS — R768 Other specified abnormal immunological findings in serum: Secondary | ICD-10-CM

## 2019-05-25 DIAGNOSIS — R7303 Prediabetes: Secondary | ICD-10-CM

## 2019-05-25 DIAGNOSIS — E785 Hyperlipidemia, unspecified: Secondary | ICD-10-CM

## 2019-05-25 DIAGNOSIS — M7062 Trochanteric bursitis, left hip: Secondary | ICD-10-CM

## 2019-05-25 DIAGNOSIS — M19041 Primary osteoarthritis, right hand: Secondary | ICD-10-CM

## 2019-05-25 DIAGNOSIS — M19042 Primary osteoarthritis, left hand: Secondary | ICD-10-CM

## 2019-05-25 DIAGNOSIS — I10 Essential (primary) hypertension: Secondary | ICD-10-CM

## 2019-05-25 DIAGNOSIS — E559 Vitamin D deficiency, unspecified: Secondary | ICD-10-CM

## 2019-05-25 DIAGNOSIS — G4709 Other insomnia: Secondary | ICD-10-CM

## 2019-05-25 NOTE — Patient Instructions (Signed)
Iliotibial Band Syndrome Rehab Ask your health care provider which exercises are safe for you. Do exercises exactly as told by your health care provider and adjust them as directed. It is normal to feel mild stretching, pulling, tightness, or discomfort as you do these exercises, but you should stop right away if you feel sudden pain or your pain gets worse.Do not begin these exercises until told by your health care provider. Stretching and range of motion exercises These exercises warm up your muscles and joints and improve the movement and flexibility of your hip and pelvis. Exercise A: Quadriceps, prone  1. Lie on your abdomen on a firm surface, such as a bed or padded floor. 2. Bend your left / right knee and hold your ankle. If you cannot reach your ankle or pant leg, loop a belt around your foot and grab the belt instead. 3. Gently pull your heel toward your buttocks. Your knee should not slide out to the side. You should feel a stretch in the front of your thigh and knee. 4. Hold this position for __________ seconds. Repeat __________ times. Complete this stretch __________ times a day. Exercise B: Iliotibial band  1. Lie on your side with your left / right leg in the top position. 2. Bend both of your knees and grab your left / right ankle. Stretch out your bottom arm to help you balance. 3. Slowly bring your top knee back so your thigh goes behind your trunk. 4. Slowly lower your top leg toward the floor until you feel a gentle stretch on the outside of your left / right hip and thigh. If you do not feel a stretch and your knee will not fall farther, place the heel of your other foot on top of your knee and pull your knee down toward the floor with your foot. 5. Hold this position for __________ seconds. Repeat __________ times. Complete this stretch __________ times a day. Strengthening exercises These exercises build strength and endurance in your hip and pelvis. Endurance is the  ability to use your muscles for a long time, even after they get tired. Exercise C: Straight leg raises (hip abductors)  1. Lie on your side with your left / right leg in the top position. Lie so your head, shoulder, knee, and hip line up. You may bend your bottom knee to help you balance. 2. Roll your hips slightly forward so your hips are stacked directly over each other and your left / right knee is facing forward. 3. Tense the muscles in your outer thigh and lift your top leg 4-6 inches (10-15 cm). 4. Hold this position for __________ seconds. 5. Slowly return to the starting position. Let your muscles relax completely before doing another repetition. Repeat __________ times. Complete this exercise __________ times a day. Exercise D: Straight leg raises (hip extensors) 1. Lie on your abdomen on your bed or a firm surface. You can put a pillow under your hips if that is more comfortable. 2. Bend your left / right knee so your foot is straight up in the air. 3. Squeeze your buttock muscles and lift your left / right thigh off the bed. Do not let your back arch. 4. Tense this muscle as hard as you can without increasing any knee pain. 5. Hold this position for __________ seconds. 6. Slowly lower your leg to the starting position and allow it to relax completely. Repeat __________ times. Complete this exercise __________ times a day. Exercise E: Hip hike  1. Stand sideways on a bottom step. Stand on your left / right leg with your other foot unsupported next to the step. You can hold onto the railing or wall if needed for balance. 2. Keep your knees straight and your torso square. Then, lift your left / right hip up toward the ceiling. 3. Slowly let your left / right hip lower toward the floor, past the starting position. Your foot should get closer to the floor. Do not lean or bend your knees. Repeat __________ times. Complete this exercise __________ times a day. This information is not  intended to replace advice given to you by your health care provider. Make sure you discuss any questions you have with your health care provider. Document Released: 12/06/2005 Document Revised: 08/10/2016 Document Reviewed: 11/07/2015 Elsevier Interactive Patient Education  2019 [place]. Knee Exercises Ask your health care provider which exercises are safe for you. Do exercises exactly as told by your health care provider and adjust them as directed. It is normal to feel mild stretching, pulling, tightness, or discomfort as you do these exercises, but you should stop right away if you feel sudden pain or your pain gets worse.Do not begin these exercises until told by your health care provider. STRETCHING AND RANGE OF MOTION EXERCISES These exercises warm up your muscles and joints and improve the movement and flexibility of your knee. These exercises also help to relieve pain, numbness, and tingling. Exercise A: Knee Extension, Prone 1. Lie on your abdomen on a bed. 2. Place your left / right knee just beyond the edge of the surface so your knee is not on the bed. You can put a towel under your left / right thigh just above your knee for comfort. 3. Relax your leg muscles and allow gravity to straighten your knee. You should feel a stretch behind your left / right knee. 4. Hold this position for __________ seconds. 5. Scoot up so your knee is supported between repetitions. Repeat __________ times. Complete this stretch __________ times a day. Exercise B: Knee Flexion, Active  1. Lie on your back with both knees straight. If this causes back discomfort, bend your left / right knee so your foot is flat on the floor. 2. Slowly slide your left / right heel back toward your buttocks until you feel a gentle stretch in the front of your knee or thigh. 3. Hold this position for __________ seconds. 4. Slowly slide your left / right heel back to the starting position. Repeat __________ times.  Complete this exercise __________ times a day. Exercise C: Quadriceps, Prone  1. Lie on your abdomen on a firm surface, such as a bed or padded floor. 2. Bend your left / right knee and hold your ankle. If you cannot reach your ankle or pant leg, loop a belt around your foot and grab the belt instead. 3. Gently pull your heel toward your buttocks. Your knee should not slide out to the side. You should feel a stretch in the front of your thigh and knee. 4. Hold this position for __________ seconds. Repeat __________ times. Complete this stretch __________ times a day. Exercise D: Hamstring, Supine 1. Lie on your back. 2. Loop a belt or towel over the ball of your left / right foot. The ball of your foot is on the walking surface, right under your toes. 3. Straighten your left / right knee and slowly pull on the belt to raise your leg until you feel a gentle stretch  behind your knee. ? Do not let your left / right knee bend while you do this. ? Keep your other leg flat on the floor. 4. Hold this position for __________ seconds. Repeat __________ times. Complete this stretch __________ times a day. STRENGTHENING EXERCISES These exercises build strength and endurance in your knee. Endurance is the ability to use your muscles for a long time, even after they get tired. Exercise E: Quadriceps, Isometric  1. Lie on your back with your left / right leg extended and your other knee bent. Put a rolled towel or small pillow under your knee if told by your health care provider. 2. Slowly tense the muscles in the front of your left / right thigh. You should see your kneecap slide up toward your hip or see increased dimpling just above the knee. This motion will push the back of the knee toward the floor. 3. For __________ seconds, keep the muscle as tight as you can without increasing your pain. 4. Relax the muscles slowly and completely. Repeat __________ times. Complete this exercise __________ times a  day. Exercise F: Straight Leg Raises - Quadriceps 1. Lie on your back with your left / right leg extended and your other knee bent. 2. Tense the muscles in the front of your left / right thigh. You should see your kneecap slide up or see increased dimpling just above the knee. Your thigh may even shake a bit. 3. Keep these muscles tight as you raise your leg 4-6 inches (10-15 cm) off the floor. Do not let your knee bend. 4. Hold this position for __________ seconds. 5. Keep these muscles tense as you lower your leg. 6. Relax your muscles slowly and completely after each repetition. Repeat __________ times. Complete this exercise __________ times a day. Exercise G: Hamstring, Isometric 1. Lie on your back on a firm surface. 2. Bend your left / right knee approximately __________ degrees. 3. Dig your left / right heel into the surface as if you are trying to pull it toward your buttocks. Tighten the muscles in the back of your thighs to dig as hard as you can without increasing any pain. 4. Hold this position for __________ seconds. 5. Release the tension gradually and allow your muscles to relax completely for __________ seconds after each repetition. Repeat __________ times. Complete this exercise __________ times a day. Exercise H: Hamstring Curls  If told by your health care provider, do this exercise while wearing ankle weights. Begin with __________ weights. Then increase the weight by 1 lb (0.5 kg) increments. Do not wear ankle weights that are more than __________. 1. Lie on your abdomen with your legs straight. 2. Bend your left / right knee as far as you can without feeling pain. Keep your hips flat against the floor. 3. Hold this position for __________ seconds. 4. Slowly lower your leg to the starting position.  Repeat __________ times. Complete this exercise __________ times a day. Exercise I: Squats (Quadriceps) 1. Stand in front of a table, with your feet and knees pointing  straight ahead. You may rest your hands on the table for balance but not for support. 2. Slowly bend your knees and lower your hips like you are going to sit in a chair. ? Keep your weight over your heels, not over your toes. ? Keep your lower legs upright so they are parallel with the table legs. ? Do not let your hips go lower than your knees. ? Do not bend lower  than told by your health care provider. ? If your knee pain increases, do not bend as low. 3. Hold the squat position for __________ seconds. 4. Slowly push with your legs to return to standing. Do not use your hands to pull yourself to standing. Repeat __________ times. Complete this exercise __________ times a day. Exercise J: Wall Slides (Quadriceps)  1. Lean your back against a smooth wall or door while you walk your feet out 18-24 inches (46-61 cm) from it. 2. Place your feet hip-width apart. 3. Slowly slide down the wall or door until your knees bend __________ degrees. Keep your knees over your heels, not over your toes. Keep your knees in line with your hips. 4. Hold for __________ seconds. Repeat __________ times. Complete this exercise __________ times a day. Exercise K: Straight Leg Raises - Hip Abductors 1. Lie on your side with your left / right leg in the top position. Lie so your head, shoulder, knee, and hip line up. You may bend your bottom knee to help you keep your balance. 2. Roll your hips slightly forward so your hips are stacked directly over each other and your left / right knee is facing forward. 3. Leading with your heel, lift your top leg 4-6 inches (10-15 cm). You should feel the muscles in your outer hip lifting. ? Do not let your foot drift forward. ? Do not let your knee roll toward the ceiling. 4. Hold this position for __________ seconds. 5. Slowly return your leg to the starting position. 6. Let your muscles relax completely after each repetition. Repeat __________ times. Complete this exercise  __________ times a day. Exercise L: Straight Leg Raises - Hip Extensors 1. Lie on your abdomen on a firm surface. You can put a pillow under your hips if that is more comfortable. 2. Tense the muscles in your buttocks and lift your left / right leg about 4-6 inches (10-15 cm). Keep your knee straight as you lift your leg. 3. Hold this position for __________ seconds. 4. Slowly lower your leg to the starting position. 5. Let your leg relax completely after each repetition. Repeat __________ times. Complete this exercise __________ times a day. This information is not intended to replace advice given to you by your health care provider. Make sure you discuss any questions you have with your health care provider. Document Released: 10/20/2005 Document Revised: 08/30/2016 Document Reviewed: 10/12/2015 Elsevier Interactive Patient Education  2018 [employer].

## 2019-06-04 ENCOUNTER — Ambulatory Visit: Payer: Self-pay | Admitting: Rheumatology

## 2019-06-15 ENCOUNTER — Other Ambulatory Visit: Payer: Self-pay

## 2019-06-15 ENCOUNTER — Ambulatory Visit (INDEPENDENT_AMBULATORY_CARE_PROVIDER_SITE_OTHER): Payer: 59

## 2019-06-15 DIAGNOSIS — E538 Deficiency of other specified B group vitamins: Secondary | ICD-10-CM

## 2019-06-15 MED ORDER — CYANOCOBALAMIN 1000 MCG/ML IJ SOLN
1000.0000 ug | Freq: Once | INTRAMUSCULAR | Status: AC
  Administered 2019-06-15: 1000 ug via INTRAMUSCULAR

## 2019-06-15 NOTE — Progress Notes (Signed)
Medical screening examination/treatment/procedure(s) were performed by non-physician practitioner and as supervising physician I was immediately available for consultation/collaboration. I agree with above. Ellarie Picking, MD   

## 2019-07-27 ENCOUNTER — Ambulatory Visit (INDEPENDENT_AMBULATORY_CARE_PROVIDER_SITE_OTHER): Payer: 59

## 2019-07-27 DIAGNOSIS — E538 Deficiency of other specified B group vitamins: Secondary | ICD-10-CM | POA: Diagnosis not present

## 2019-07-27 MED ORDER — CYANOCOBALAMIN 1000 MCG/ML IJ SOLN
1000.0000 ug | Freq: Once | INTRAMUSCULAR | Status: AC
  Administered 2019-07-27: 17:00:00 1000 ug via INTRAMUSCULAR

## 2019-07-27 NOTE — Progress Notes (Signed)
b12 Injection given.   Tynika Luddy J Ladavion Savitz, MD  

## 2019-07-29 ENCOUNTER — Other Ambulatory Visit: Payer: Self-pay | Admitting: Internal Medicine

## 2019-07-29 DIAGNOSIS — I10 Essential (primary) hypertension: Secondary | ICD-10-CM

## 2019-08-01 ENCOUNTER — Other Ambulatory Visit: Payer: Self-pay | Admitting: Internal Medicine

## 2019-09-12 ENCOUNTER — Ambulatory Visit (INDEPENDENT_AMBULATORY_CARE_PROVIDER_SITE_OTHER): Payer: 59

## 2019-09-12 ENCOUNTER — Other Ambulatory Visit: Payer: Self-pay

## 2019-09-12 DIAGNOSIS — E538 Deficiency of other specified B group vitamins: Secondary | ICD-10-CM

## 2019-09-12 DIAGNOSIS — Z23 Encounter for immunization: Secondary | ICD-10-CM | POA: Diagnosis not present

## 2019-09-12 MED ORDER — CYANOCOBALAMIN 1000 MCG/ML IJ SOLN
1000.0000 ug | Freq: Once | INTRAMUSCULAR | Status: AC
  Administered 2019-09-12: 1000 ug via INTRAMUSCULAR

## 2019-09-12 NOTE — Progress Notes (Signed)
I have reviewed and agree.

## 2019-09-26 LAB — HM MAMMOGRAPHY

## 2019-10-18 ENCOUNTER — Other Ambulatory Visit: Payer: Self-pay

## 2019-10-18 ENCOUNTER — Ambulatory Visit (INDEPENDENT_AMBULATORY_CARE_PROVIDER_SITE_OTHER): Payer: 59

## 2019-10-18 DIAGNOSIS — E538 Deficiency of other specified B group vitamins: Secondary | ICD-10-CM

## 2019-10-18 MED ORDER — CYANOCOBALAMIN 1000 MCG/ML IJ SOLN
1000.0000 ug | Freq: Once | INTRAMUSCULAR | Status: AC
  Administered 2019-10-18: 1000 ug via INTRAMUSCULAR

## 2019-10-18 NOTE — Progress Notes (Signed)
I have reviewed and agree.

## 2019-10-29 ENCOUNTER — Other Ambulatory Visit: Payer: Self-pay | Admitting: Internal Medicine

## 2019-10-29 DIAGNOSIS — I10 Essential (primary) hypertension: Secondary | ICD-10-CM

## 2019-11-14 ENCOUNTER — Ambulatory Visit (INDEPENDENT_AMBULATORY_CARE_PROVIDER_SITE_OTHER): Payer: 59

## 2019-11-14 ENCOUNTER — Other Ambulatory Visit: Payer: Self-pay

## 2019-11-14 DIAGNOSIS — E538 Deficiency of other specified B group vitamins: Secondary | ICD-10-CM

## 2019-11-14 MED ORDER — CYANOCOBALAMIN 1000 MCG/ML IJ SOLN
1000.0000 ug | Freq: Once | INTRAMUSCULAR | Status: AC
  Administered 2019-11-14: 1000 ug via INTRAMUSCULAR

## 2019-11-14 NOTE — Progress Notes (Signed)
I have reviewed and agree.

## 2019-11-26 ENCOUNTER — Other Ambulatory Visit: Payer: Self-pay | Admitting: Internal Medicine

## 2019-12-17 ENCOUNTER — Ambulatory Visit (INDEPENDENT_AMBULATORY_CARE_PROVIDER_SITE_OTHER): Payer: 59

## 2019-12-17 ENCOUNTER — Other Ambulatory Visit: Payer: Self-pay

## 2019-12-17 DIAGNOSIS — E538 Deficiency of other specified B group vitamins: Secondary | ICD-10-CM | POA: Diagnosis not present

## 2019-12-17 MED ORDER — CYANOCOBALAMIN 1000 MCG/ML IJ SOLN
1000.0000 ug | Freq: Once | INTRAMUSCULAR | Status: AC
  Administered 2019-12-17: 1000 ug via INTRAMUSCULAR

## 2019-12-17 NOTE — Progress Notes (Signed)
I have reviewed and agree.

## 2020-01-11 ENCOUNTER — Encounter: Payer: Self-pay | Admitting: Family

## 2020-01-11 ENCOUNTER — Ambulatory Visit (INDEPENDENT_AMBULATORY_CARE_PROVIDER_SITE_OTHER): Payer: 59 | Admitting: Family

## 2020-01-11 ENCOUNTER — Other Ambulatory Visit: Payer: Self-pay

## 2020-01-11 VITALS — BP 124/76 | HR 76 | Temp 98.1°F | Ht 63.5 in | Wt 217.2 lb

## 2020-01-11 DIAGNOSIS — R300 Dysuria: Secondary | ICD-10-CM | POA: Diagnosis not present

## 2020-01-11 DIAGNOSIS — R7303 Prediabetes: Secondary | ICD-10-CM | POA: Diagnosis not present

## 2020-01-11 DIAGNOSIS — R102 Pelvic and perineal pain: Secondary | ICD-10-CM | POA: Diagnosis not present

## 2020-01-11 LAB — COMPREHENSIVE METABOLIC PANEL
ALT: 17 U/L (ref 0–35)
AST: 16 U/L (ref 0–37)
Albumin: 4.2 g/dL (ref 3.5–5.2)
Alkaline Phosphatase: 50 U/L (ref 39–117)
BUN: 11 mg/dL (ref 6–23)
CO2: 25 mEq/L (ref 19–32)
Calcium: 9.5 mg/dL (ref 8.4–10.5)
Chloride: 105 mEq/L (ref 96–112)
Creatinine, Ser: 0.81 mg/dL (ref 0.40–1.20)
GFR: 92.89 mL/min (ref >60.00)
Glucose, Bld: 107 mg/dL — ABNORMAL HIGH (ref 70–99)
Potassium: 3.8 mEq/L (ref 3.5–5.1)
Sodium: 138 mEq/L (ref 135–145)
Total Bilirubin: 0.5 mg/dL (ref 0.2–1.2)
Total Protein: 7.3 g/dL (ref 6.0–8.3)

## 2020-01-11 LAB — POC URINALSYSI DIPSTICK (AUTOMATED)
Bilirubin, UA: NEGATIVE
Blood, UA: NEGATIVE
Glucose, UA: NEGATIVE
Ketones, UA: NEGATIVE
Leukocytes, UA: NEGATIVE
Nitrite, UA: NEGATIVE
Protein, UA: NEGATIVE
Spec Grav, UA: 1.025 (ref 1.010–1.025)
Urobilinogen, UA: 0.2 E.U./dL
pH, UA: 5.5 (ref 5.0–8.0)

## 2020-01-11 LAB — HEMOGLOBIN A1C: Hgb A1c MFr Bld: 5.8 % (ref 4.6–6.5)

## 2020-01-11 MED ORDER — NITROFURANTOIN MONOHYD MACRO 100 MG PO CAPS: 100.0000 mg | ORAL_CAPSULE | Freq: Two times a day (BID) | ORAL | 0 refills | Status: DC

## 2020-01-11 NOTE — Progress Notes (Signed)
Samantha Ball is a 44 y.o. female with the following history as recorded in [employer]:  Patient Active Problem List   Diagnosis Date Noted  . Arthralgia 02/01/2019  . Vitamin D deficiency disease 11/21/2018  . B12 deficiency 11/21/2018  . Perennial allergic rhinitis 03/06/2018  . Prediabetes 01/18/2018  . Seasonal allergic rhinitis due to pollen 05/09/2017  . Hyperlipidemia LDL goal <130 12/21/2016  . Snoring 11/09/2016  . Other iron deficiency anemias 12/17/2014  . Insomnia 01/09/2014  . GERD (gastroesophageal reflux disease) 01/09/2014  . Constipation 01/09/2014  . Diuretic-induced hypokalemia 01/09/2014  . Positive ANA (antinuclear antibody) 07/16/2013  . Essential hypertension, benign 07/11/2013  . Visit for screening mammogram 07/11/2013  . Obesity (BMI 30.0-34.9) 07/11/2013    Current Outpatient Medications  Medication Sig Dispense Refill  . BIOTIN PO Take by mouth daily.    Marland Kitchen BYSTOLIC 5 MG tablet TAKE 1 TABLET BY MOUTH EVERY DAY 90 tablet 0  . Cholecalciferol (VITAMIN D3) 1.25 MG (50000 UT) CAPS TAKE 1 CAPSULE BY MOUTH ONE TIME PER WEEK 12 capsule 0  . clobetasol (TEMOVATE) 0.05 % external solution APPLY TO SCALP UP TO TWICE A DAY AS NEEDED (NOT TO FACE, GROIN, UNDERARMS)    . Cyanocobalamin (VITAMIN B-12 IJ) Inject as directed every 30 (thirty) days.    . Cyanocobalamin (VITAMIN B-12 PO) Take by mouth daily.    Marland Kitchen esomeprazole (NEXIUM) 40 MG capsule TAKE 1 CAPSULE BY MOUTH EVERY DAY 90 capsule 1  . ferrous sulfate 325 (65 FE) MG tablet TAKE 1 TABLET BY MOUTH 3 TIMES DAILY WITH MEALS. 270 tablet 1  . ibuprofen (ADVIL) 600 MG tablet Take 600 mg by mouth every 6 (six) hours as needed.    . montelukast (SINGULAIR) 10 MG tablet Take 1 tablet (10 mg total) by mouth at bedtime. 90 tablet 0  . naproxen (NAPROSYN) 375 MG tablet TAKE 1 TABLET BY MOUTH TWICE A DAY WITH MEALS 60 tablet 1  . potassium chloride (K-DUR) 10 MEQ tablet TAKE 1 TABLET BY MOUTH THREE TIMES A DAY 270 tablet 1   . nitrofurantoin, macrocrystal-monohydrate, (MACROBID) 100 MG capsule Take 1 capsule (100 mg total) by mouth 2 (two) times daily. 14 capsule 0  . spironolactone (ALDACTONE) 25 MG tablet TAKE 1 TABLET BY MOUTH EVERY DAY (Patient not taking: Reported on 01/11/2020) 90 tablet 0   No current facility-administered medications for this visit.    Allergies: Amlodipine  Past Medical History:  Diagnosis Date  . Anemia   . Hyperlipidemia   . Hypertension   . Obesity   . Sleep apnea   . Urticaria     Past Surgical History:  Procedure Laterality Date  . TUBAL LIGATION    . wisdom teeth  2000    Family History  Problem Relation Age of Onset  . Heart disease Father   . Diabetes Maternal Aunt   . Breast cancer Maternal Grandmother   . Allergic rhinitis Daughter   . Eczema Daughter   . Alcohol abuse Neg Hx   . COPD Neg Hx   . Depression Neg Hx   . Drug abuse Neg Hx   . Early death Neg Hx   . Hearing loss Neg Hx   . Hyperlipidemia Neg Hx   . Hypertension Neg Hx   . Kidney disease Neg Hx   . Stroke Neg Hx   . Colon cancer Neg Hx   . Rectal cancer Neg Hx   . Stomach cancer Neg Hx  Social History   Tobacco Use  . Smoking status: Never Smoker  . Smokeless tobacco: Never Used  Substance Use Topics  . Alcohol use: No    Subjective:   Patient presents with concerns for urinary pressure and frequency x 1 month; + low back pain; notes that has some intermittent spotting in the past month; per patient, she does not have any history of uterine fibroids/ ovarian cysts;   LMP- 3 weeks ago; BTL  Objective:  Vitals:   01/11/20 0902  BP: 124/76  Pulse: 76  Temp: 98.1 F (36.7 C)  TempSrc: Oral  SpO2: 98%  Weight: 217 lb 3.2 oz (98.5 kg)  Height: 5' 3.5" (1.613 m)    General: Well developed, well nourished, in no acute distress  Skin : Warm and dry.  Lungs: Respirations unlabored; clear to auscultation bilaterally without wheeze, rales, rhonchi  CVS exam: normal rate and  regular rhythm.  Musculoskeletal: No deformities; no active joint inflammation  Extremities: No edema, cyanosis, clubbing  Vessels: Symmetric bilaterally  Neurologic: Alert and oriented; speech intact; face symmetrical; moves all extremities well; CNII-XII intact without focal deficit   Assessment:  1. Dysuria   2. Pelvic pain   3. Pre-diabetes     Plan:  ? Pelvic source; check U/A and urine culture; Rx for Macrobid 100 mg bid x 7 days; order pelvic ultrasound- ? Uterine fibroids; follow-up to be determined.   No follow-ups on file.  Orders Placed This Encounter  Procedures  . Urine Culture    Standing Status:   Future    Number of Occurrences:   1    Standing Expiration Date:   01/10/2021  . US Pelvic Complete With Transvaginal    Standing Status:   Future    Standing Expiration Date:   03/10/2021    Order Specific Question:   Reason for Exam (SYMPTOM  OR DIAGNOSIS REQUIRED)    Answer:   pelvic pain    Order Specific Question:   Preferred imaging location?    Answer:   [facility]  . Comp Met (CMET)  . HgB A1c  . POCT Urinalysis Dipstick (Automated)    Requested Prescriptions   Signed Prescriptions Disp Refills  . nitrofurantoin, macrocrystal-monohydrate, (MACROBID) 100 MG capsule 14 capsule 0    Sig: Take 1 capsule (100 mg total) by mouth 2 (two) times daily.

## 2020-01-13 LAB — URINE CULTURE

## 2020-01-17 ENCOUNTER — Other Ambulatory Visit: Payer: Self-pay

## 2020-01-17 ENCOUNTER — Ambulatory Visit (INDEPENDENT_AMBULATORY_CARE_PROVIDER_SITE_OTHER): Payer: 59 | Admitting: *Deleted

## 2020-01-17 DIAGNOSIS — E538 Deficiency of other specified B group vitamins: Secondary | ICD-10-CM | POA: Diagnosis not present

## 2020-01-17 MED ORDER — CYANOCOBALAMIN 1000 MCG/ML IJ SOLN
1000.0000 ug | Freq: Once | INTRAMUSCULAR | Status: AC
  Administered 2020-01-17: 09:00:00 1000 ug via INTRAMUSCULAR

## 2020-01-17 NOTE — Progress Notes (Signed)
I have reviewed and agree.

## 2020-01-17 NOTE — Progress Notes (Signed)
Pls cosign for b12 

## 2020-01-28 ENCOUNTER — Other Ambulatory Visit: Payer: Self-pay | Admitting: Internal Medicine

## 2020-01-28 ENCOUNTER — Ambulatory Visit
Admission: RE | Admit: 2020-01-28 | Discharge: 2020-01-28 | Disposition: A | Discharge status: Home / Self Care | Payer: 59 | Source: Ambulatory Visit | Attending: Family | Admitting: Family

## 2020-01-28 DIAGNOSIS — I10 Essential (primary) hypertension: Secondary | ICD-10-CM

## 2020-01-28 DIAGNOSIS — R102 Pelvic and perineal pain: Secondary | ICD-10-CM

## 2020-01-29 ENCOUNTER — Telehealth: Payer: Self-pay

## 2020-01-29 NOTE — Telephone Encounter (Signed)
Samantha Ball with [facility] called this morning with STAT report.  She wanted me to make sure you saw and received patient's results regarding her scan.

## 2020-02-04 ENCOUNTER — Telehealth: Payer: Self-pay

## 2020-02-04 NOTE — Telephone Encounter (Signed)
Key: [id number]

## 2020-02-15 ENCOUNTER — Ambulatory Visit (INDEPENDENT_AMBULATORY_CARE_PROVIDER_SITE_OTHER): Payer: 59 | Admitting: *Deleted

## 2020-02-15 ENCOUNTER — Other Ambulatory Visit: Payer: Self-pay

## 2020-02-15 DIAGNOSIS — E538 Deficiency of other specified B group vitamins: Secondary | ICD-10-CM | POA: Diagnosis not present

## 2020-02-15 MED ORDER — CYANOCOBALAMIN 1000 MCG/ML IJ SOLN
1000.0000 ug | Freq: Once | INTRAMUSCULAR | Status: AC
  Administered 2020-02-15: 09:00:00 1000 ug via INTRAMUSCULAR

## 2020-02-15 NOTE — Progress Notes (Addendum)
I have reviewed and agree.

## 2020-03-02 ENCOUNTER — Other Ambulatory Visit: Payer: Self-pay | Admitting: Internal Medicine

## 2020-03-28 ENCOUNTER — Ambulatory Visit (INDEPENDENT_AMBULATORY_CARE_PROVIDER_SITE_OTHER): Payer: 59 | Admitting: *Deleted

## 2020-03-28 ENCOUNTER — Other Ambulatory Visit: Payer: Self-pay

## 2020-03-28 DIAGNOSIS — E538 Deficiency of other specified B group vitamins: Secondary | ICD-10-CM

## 2020-03-28 MED ORDER — CYANOCOBALAMIN 1000 MCG/ML IJ SOLN
1000.0000 ug | Freq: Once | INTRAMUSCULAR | Status: AC
  Administered 2020-03-28: 1000 ug via INTRAMUSCULAR

## 2020-03-28 NOTE — Progress Notes (Signed)
Pls cosign for B12 since PCP is out of the officed today.Marland KitchenJohny Chess

## 2020-04-01 ENCOUNTER — Encounter: Payer: Self-pay | Admitting: Internal Medicine

## 2020-04-01 ENCOUNTER — Ambulatory Visit (INDEPENDENT_AMBULATORY_CARE_PROVIDER_SITE_OTHER): Payer: 59 | Admitting: Internal Medicine

## 2020-04-01 ENCOUNTER — Other Ambulatory Visit: Payer: Self-pay

## 2020-04-01 VITALS — BP 158/100 | HR 77 | Temp 99.4°F | Resp 16 | Ht 63.5 in | Wt 215.0 lb

## 2020-04-01 DIAGNOSIS — E538 Deficiency of other specified B group vitamins: Secondary | ICD-10-CM

## 2020-04-01 DIAGNOSIS — J3089 Other allergic rhinitis: Secondary | ICD-10-CM

## 2020-04-01 DIAGNOSIS — L508 Other urticaria: Secondary | ICD-10-CM

## 2020-04-01 DIAGNOSIS — K2100 Gastro-esophageal reflux disease with esophagitis, without bleeding: Secondary | ICD-10-CM

## 2020-04-01 DIAGNOSIS — Z0000 Encounter for general adult medical examination without abnormal findings: Secondary | ICD-10-CM | POA: Diagnosis not present

## 2020-04-01 DIAGNOSIS — I10 Essential (primary) hypertension: Secondary | ICD-10-CM | POA: Diagnosis not present

## 2020-04-01 DIAGNOSIS — E559 Vitamin D deficiency, unspecified: Secondary | ICD-10-CM | POA: Diagnosis not present

## 2020-04-01 DIAGNOSIS — E876 Hypokalemia: Secondary | ICD-10-CM

## 2020-04-01 DIAGNOSIS — Z1231 Encounter for screening mammogram for malignant neoplasm of breast: Secondary | ICD-10-CM

## 2020-04-01 DIAGNOSIS — R7303 Prediabetes: Secondary | ICD-10-CM

## 2020-04-01 DIAGNOSIS — T502X5A Adverse effect of carbonic-anhydrase inhibitors, benzothiadiazides and other diuretics, initial encounter: Secondary | ICD-10-CM

## 2020-04-01 DIAGNOSIS — J301 Allergic rhinitis due to pollen: Secondary | ICD-10-CM

## 2020-04-01 DIAGNOSIS — D508 Other iron deficiency anemias: Secondary | ICD-10-CM

## 2020-04-01 DIAGNOSIS — L2084 Intrinsic (allergic) eczema: Secondary | ICD-10-CM

## 2020-04-01 HISTORY — DX: Intrinsic (allergic) eczema: L20.84

## 2020-04-01 LAB — CBC WITH DIFFERENTIAL/PLATELET
Basophils Absolute: 0 10*3/uL (ref 0.0–0.1)
Basophils Relative: 0.4 % (ref 0.0–3.0)
Eosinophils Absolute: 0.1 10*3/uL (ref 0.0–0.7)
Eosinophils Relative: 1.6 % (ref 0.0–5.0)
HCT: 38.4 % (ref 36.0–46.0)
Hemoglobin: 12.6 g/dL (ref 12.0–15.0)
Lymphocytes Relative: 37 % (ref 12.0–46.0)
Lymphs Abs: 2.6 10*3/uL (ref 0.7–4.0)
MCHC: 33 g/dL (ref 30.0–36.0)
MCV: 80.1 fl (ref 78.0–100.0)
Monocytes Absolute: 0.6 10*3/uL (ref 0.1–1.0)
Monocytes Relative: 8.5 % (ref 3.0–12.0)
Neutro Abs: 3.7 10*3/uL (ref 1.4–7.7)
Neutrophils Relative %: 52.5 % (ref 43.0–77.0)
Platelets: 277 10*3/uL (ref 150.0–400.0)
RBC: 4.79 Mil/uL (ref 3.87–5.11)
RDW: 15.5 % (ref 11.5–15.5)
WBC: 7 10*3/uL (ref 4.0–10.5)

## 2020-04-01 LAB — BASIC METABOLIC PANEL
BUN: 7 mg/dL (ref 6–23)
CO2: 26 mEq/L (ref 19–32)
Calcium: 9.7 mg/dL (ref 8.4–10.5)
Chloride: 104 mEq/L (ref 96–112)
Creatinine, Ser: 0.73 mg/dL (ref 0.40–1.20)
GFR: 104.63 mL/min (ref >60.00)
Glucose, Bld: 106 mg/dL — ABNORMAL HIGH (ref 70–99)
Potassium: 3.9 mEq/L (ref 3.5–5.1)
Sodium: 135 mEq/L (ref 135–145)

## 2020-04-01 LAB — FOLATE: Folate: 10.8 ng/mL (ref >5.9)

## 2020-04-01 LAB — IBC PANEL
Iron: 34 ug/dL — ABNORMAL LOW (ref 42–145)
Saturation Ratios: 7.4 % — ABNORMAL LOW (ref 20.0–50.0)
Transferrin: 329 mg/dL (ref 212.0–360.0)

## 2020-04-01 LAB — LIPID PANEL
Cholesterol: 198 mg/dL (ref 0–200)
HDL: 43.8 mg/dL (ref >39.00)
LDL Cholesterol: 138 mg/dL — ABNORMAL HIGH (ref 0–99)
NonHDL: 153.83
Total CHOL/HDL Ratio: 5
Triglycerides: 78 mg/dL (ref 0.0–149.0)
VLDL: 15.6 mg/dL (ref 0.0–40.0)

## 2020-04-01 LAB — VITAMIN D 25 HYDROXY (VIT D DEFICIENCY, FRACTURES): VITD: 34.73 ng/mL (ref 30.00–100.00)

## 2020-04-01 LAB — FERRITIN: Ferritin: 10.9 ng/mL (ref 10.0–291.0)

## 2020-04-01 LAB — HEMOGLOBIN A1C: Hgb A1c MFr Bld: 5.7 % (ref 4.6–6.5)

## 2020-04-01 MED ORDER — CLOBETASOL PROPIONATE 0.05 % EX SOLN: Freq: Two times a day (BID) | CUTANEOUS | 3 refills | Status: DC

## 2020-04-01 MED ORDER — LEVOCETIRIZINE DIHYDROCHLORIDE 5 MG PO TABS: 5.0000 mg | ORAL_TABLET | Freq: Every evening | ORAL | 1 refills | Status: DC

## 2020-04-01 MED ORDER — SPIRONOLACTONE 25 MG PO TABS: 25.0000 mg | ORAL_TABLET | Freq: Every day | ORAL | 1 refills | Status: DC

## 2020-04-01 MED ORDER — NEBIVOLOL HCL 5 MG PO TABS: 5.0000 mg | ORAL_TABLET | Freq: Every day | ORAL | 1 refills | Status: DC

## 2020-04-01 MED ORDER — ESOMEPRAZOLE MAGNESIUM 40 MG PO CPDR: 40.0000 mg | DELAYED_RELEASE_CAPSULE | Freq: Every day | ORAL | 1 refills | Status: DC

## 2020-04-01 MED ORDER — POTASSIUM CHLORIDE ER 10 MEQ PO TBCR: 10.0000 meq | EXTENDED_RELEASE_TABLET | Freq: Three times a day (TID) | ORAL | 1 refills | Status: DC

## 2020-04-01 MED ORDER — FERROUS SULFATE 325 (65 FE) MG PO TABS: ORAL_TABLET | ORAL | 1 refills | Status: DC

## 2020-04-01 MED ORDER — MONTELUKAST SODIUM 10 MG PO TABS: 10.0000 mg | ORAL_TABLET | Freq: Every day | ORAL | 1 refills | Status: DC

## 2020-04-01 NOTE — Progress Notes (Signed)
Subjective:  Patient ID: Samantha Ball, female    DOB: July 13, 1976  Age: 44 y.o. MRN: [id number]  CC: Annual Exam, Hypertension, Gastroesophageal Reflux, and Allergic Rhinitis   This visit occurred during the SARS-CoV-2 public health emergency.  Safety protocols were in place, including screening questions prior to the visit, additional usage of staff PPE, and extensive cleaning of exam room while observing appropriate contact time as indicated for disinfecting solutions.    HPI Samantha Ball presents for a CPX.  She continues to complain of intermittent hemorrhoidal bleeding.  Her colonoscopy 2 years ago was otherwise unremarkable.  She has been out of her antihypertensives for least 2 weeks.  She tells me her blood pressure has not been well controlled.  She denies any recent episodes of headache, blurred vision, chest pain, shortness of breath, palpitations, edema, or fatigue.  Outpatient Medications Prior to Visit  Medication Sig Dispense Refill  . BIOTIN PO Take by mouth daily.    . Cholecalciferol (VITAMIN D3) 1.25 MG (50000 UT) CAPS TAKE 1 CAPSULE BY MOUTH ONE TIME PER WEEK 12 capsule 0  . Cyanocobalamin (VITAMIN B-12 IJ) Inject as directed every 30 (thirty) days.    Marland Kitchen BYSTOLIC 5 MG tablet TAKE 1 TABLET BY MOUTH EVERY DAY 90 tablet 0  . clobetasol (TEMOVATE) 0.05 % external solution APPLY TO SCALP UP TO TWICE A DAY AS NEEDED (NOT TO FACE, GROIN, UNDERARMS)    . esomeprazole (NEXIUM) 40 MG capsule TAKE 1 CAPSULE BY MOUTH EVERY DAY 90 capsule 1  . ferrous sulfate 325 (65 FE) MG tablet TAKE 1 TABLET BY MOUTH 3 TIMES DAILY WITH MEALS. 270 tablet 1  . ibuprofen (ADVIL) 600 MG tablet Take 600 mg by mouth every 6 (six) hours as needed.    . montelukast (SINGULAIR) 10 MG tablet Take 1 tablet (10 mg total) by mouth at bedtime. 90 tablet 0  . naproxen (NAPROSYN) 375 MG tablet TAKE 1 TABLET BY MOUTH TWICE A DAY WITH MEALS 60 tablet 1  . potassium chloride (K-DUR) 10 MEQ tablet TAKE 1 TABLET  BY MOUTH THREE TIMES A DAY 270 tablet 1  . spironolactone (ALDACTONE) 25 MG tablet TAKE 1 TABLET BY MOUTH EVERY DAY 90 tablet 0  . Cyanocobalamin (VITAMIN B-12 PO) Take by mouth daily.    . nitrofurantoin, macrocrystal-monohydrate, (MACROBID) 100 MG capsule Take 1 capsule (100 mg total) by mouth 2 (two) times daily. 14 capsule 0   No facility-administered medications prior to visit.    ROS Review of Systems  Constitutional: Negative.  Negative for appetite change, chills, diaphoresis, fatigue and fever.  HENT: Positive for congestion, rhinorrhea and sneezing. Negative for sinus pain and trouble swallowing.   Eyes: Negative for visual disturbance.  Respiratory: Negative for cough, chest tightness, shortness of breath and wheezing.   Cardiovascular: Negative for chest pain, palpitations and leg swelling.  Gastrointestinal: Positive for anal bleeding and blood in stool. Negative for abdominal pain, diarrhea, nausea and vomiting.  Genitourinary: Negative.  Negative for difficulty urinating.  Musculoskeletal: Negative for arthralgias and myalgias.  Skin: Positive for rash (itching). Negative for color change and pallor.  Neurological: Negative for dizziness, weakness and light-headedness.  Hematological: Negative for adenopathy. Does not bruise/bleed easily.  Psychiatric/Behavioral: Negative.     Objective:  BP (!) 158/100 (BP Location: Left Arm, Patient Position: Sitting, Cuff Size: Large)   Pulse 77   Temp 99.4 F (37.4 C) (Oral)   Resp 16   Ht 5' 3.5" (1.613 m)  Wt 215 lb (97.5 kg)   LMP 03/20/2020   SpO2 99%   BMI 37.49 kg/m   BP Readings from Last 3 Encounters:  04/01/20 (!) 158/100  01/11/20 124/76  05/25/19 127/80    Wt Readings from Last 3 Encounters:  04/01/20 215 lb (97.5 kg)  01/11/20 217 lb 3.2 oz (98.5 kg)  05/25/19 217 lb (98.4 kg)    Physical Exam Vitals reviewed.  Constitutional:      Appearance: She is obese.  HENT:     Nose: Nose normal.      Mouth/Throat:     Mouth: Mucous membranes are moist.  Eyes:     General: No scleral icterus.    Conjunctiva/sclera: Conjunctivae normal.  Cardiovascular:     Rate and Rhythm: Normal rate and regular rhythm.     Heart sounds: No murmur.  Pulmonary:     Effort: Pulmonary effort is normal.     Breath sounds: No stridor. No wheezing, rhonchi or rales.  Abdominal:     General: Abdomen is protuberant. Bowel sounds are normal.     Palpations: Abdomen is soft. There is no hepatomegaly, splenomegaly or mass.     Tenderness: There is no abdominal tenderness.     Hernia: No hernia is present.  Musculoskeletal:        General: Normal range of motion.     Cervical back: Neck supple.     Right lower leg: No edema.     Left lower leg: No edema.  Lymphadenopathy:     Cervical: No cervical adenopathy.  Skin:    General: Skin is warm and dry.     Coloration: Skin is not pale.     Findings: Rash present. Rash is papular.  Neurological:     General: No focal deficit present.     Mental Status: She is alert.  Psychiatric:        Mood and Affect: Mood normal.        Behavior: Behavior normal.     Lab Results  Component Value Date   WBC 7.0 04/01/2020   HGB 12.6 04/01/2020   HCT 38.4 04/01/2020   PLT 277.0 04/01/2020   GLUCOSE 106 (H) 04/01/2020   CHOL 198 04/01/2020   TRIG 78.0 04/01/2020   HDL 43.80 04/01/2020   LDLDIRECT 99.0 12/21/2016   LDLCALC 138 (H) 04/01/2020   ALT 17 01/11/2020   AST 16 01/11/2020   NA 135 04/01/2020   K 3.9 04/01/2020   CL 104 04/01/2020   CREATININE 0.73 04/01/2020   BUN 7 04/01/2020   CO2 26 04/01/2020   TSH 1.91 11/20/2018   HGBA1C 5.7 04/01/2020    US Pelvic Complete With Transvaginal  Result Date: 01/28/2020 CLINICAL DATA:  Pelvic pain for 1 month, heavy menses at times EXAM: TRANSABDOMINAL AND TRANSVAGINAL ULTRASOUND OF PELVIS TECHNIQUE: Both transabdominal and transvaginal ultrasound examinations of the pelvis were performed. Transabdominal  technique was performed for global imaging of the pelvis including uterus, ovaries, adnexal regions, and pelvic cul-de-sac. It was necessary to proceed with endovaginal exam following the transabdominal exam to visualize the ovaries, endometrium and adnexa. COMPARISON:  None FINDINGS: Uterus Measurements: 11.4 x 4.1 x 6.0 cm = volume: 148 mL. Anteverted. Large leiomyoma posterior LEFT uterus 4.7 x 3.5 x 4.5 cm, subserosal. No definite endometrial extension. No additional masses. Endometrium Thickness: 6 mm. Few nonspecific echogenic foci within endometrial complex with question of a focal hyperechoic nodule/polyp at upper uterine segment 13 x 6 x 13 mm. No endometrial  fluid. Right ovary Measurements: 2.9 x 1.7 x 2.1 cm = volume: 5.4 mL. Normal morphology without mass Left ovary Measurements: 2.3 x 1.9 x 2.5 cm = volume: 5.8 mL. Normal morphology without mass Other findings No free pelvic fluid.  No adnexal masses. IMPRESSION: LEFT-sided uterine leiomyoma 4.7 cm diameter. Questionable endometrial polyp 13 x 6 x 13 mm; consider further evaluation with sonohysterogram for confirmation prior to hysteroscopy. Endometrial sampling should also be considered if patient is at high risk for endometrial carcinoma. (Ref: Radiological Reasoning: Algorithmic Workup of Abnormal Vaginal Bleeding with Endovaginal Sonography and Sonohysterography. AJR [id number]) These results will be called to the ordering clinician or representative by the Radiologist Assistant, and communication documented in the PACS or zVision Dashboard. Electronically Signed   By: Lavonia Dana M.D.   On: 01/28/2020 17:05    Assessment & Plan:   Turkessa was seen today for annual exam, hypertension, gastroesophageal reflux and allergic rhinitis .  Diagnoses and all orders for this visit:  Essential hypertension, benign- Her blood pressure is not adequately well controlled.  I have asked her to avoid anti-inflammatories.  Will restart nebivolol and  spironolactone.  She also agrees to improve her lifestyle modifications. -     nebivolol (BYSTOLIC) 5 MG tablet; Take 1 tablet (5 mg total) by mouth daily. -     spironolactone (ALDACTONE) 25 MG tablet; Take 1 tablet (25 mg total) by mouth daily.  B12 deficiency- She is not anemic and her B12 level is normal on high-dose oral B12 supplementation. -     CBC with Differential/Platelet; Future -     Folate; Future -     Folate -     CBC with Differential/Platelet  Vitamin D deficiency disease- Her vitamin D level is normal now. -     VITAMIN D 25 Hydroxy (Vit-D Deficiency, Fractures); Future -     VITAMIN D 25 Hydroxy (Vit-D Deficiency, Fractures)  Prediabetes- Her blood sugars are normal now. -     Basic metabolic panel; Future -     Hemoglobin A1c; Future -     Hemoglobin A1c -     Basic metabolic panel  Other iron deficiency anemias- Her H&H are normal.  Will continue iron replacement therapy. -     IBC panel; Future -     CBC with Differential/Platelet; Future -     Ferritin; Future -     ferrous sulfate 325 (65 FE) MG tablet; TAKE 1 TABLET BY MOUTH 3 TIMES DAILY WITH MEALS. -     Ferritin -     CBC with Differential/Platelet -     IBC panel  Routine general medical examination at a health care facility- Exam completed, labs reviewed, vaccines reviewed and updated, screening for colon and cervical cancer up-to-date, she is referred for breast cancer screening, patient education was given. -     Lipid panel; Future -     HIV Antibody (routine testing w rflx); Future -     HIV Antibody (routine testing w rflx) -     Lipid panel  Seasonal allergic rhinitis due to pollen -     montelukast (SINGULAIR) 10 MG tablet; Take 1 tablet (10 mg total) by mouth at bedtime.  Urticaria, chronic- The urticaria is well controlled with a daily dose of montelukast. -     montelukast (SINGULAIR) 10 MG tablet; Take 1 tablet (10 mg total) by mouth at bedtime.  Diuretic-induced hypokalemia-Her  potassium level is normal at 3.9.  Will continue  the combination of a potassium supplement and spironolactone. -     potassium chloride (KLOR-CON) 10 MEQ tablet; Take 1 tablet (10 mEq total) by mouth 3 (three) times daily. -     spironolactone (ALDACTONE) 25 MG tablet; Take 1 tablet (25 mg total) by mouth daily.  Perennial allergic rhinitis -     levocetirizine (XYZAL) 5 MG tablet; Take 1 tablet (5 mg total) by mouth every evening.  Gastroesophageal reflux disease with esophagitis without hemorrhage- Her symptoms are well controlled with the PPI. -     esomeprazole (NEXIUM) 40 MG capsule; Take 1 capsule (40 mg total) by mouth daily.  Intrinsic eczema -     clobetasol (TEMOVATE) 0.05 % external solution; Apply topically 2 (two) times daily.  Visit for screening mammogram -     MM DIGITAL SCREENING BILATERAL; Future   I have discontinued Cathlean Cower D. Hokenson's Cyanocobalamin (VITAMIN B-12 PO), ibuprofen, nitrofurantoin (macrocrystal-monohydrate), and naproxen. I have changed her Bystolic to nebivolol. I have also changed her esomeprazole, potassium chloride, spironolactone, and clobetasol. Additionally, I am having her start on levocetirizine. Lastly, I am having her maintain her BIOTIN PO, Cyanocobalamin (VITAMIN B-12 IJ), Vitamin D3, ferrous sulfate, and montelukast.  Meds ordered this encounter  Medications  . nebivolol (BYSTOLIC) 5 MG tablet    Sig: Take 1 tablet (5 mg total) by mouth daily.    Dispense:  90 tablet    Refill:  1  . esomeprazole (NEXIUM) 40 MG capsule    Sig: Take 1 capsule (40 mg total) by mouth daily.    Dispense:  90 capsule    Refill:  1  . ferrous sulfate 325 (65 FE) MG tablet    Sig: TAKE 1 TABLET BY MOUTH 3 TIMES DAILY WITH MEALS.    Dispense:  270 tablet    Refill:  1  . montelukast (SINGULAIR) 10 MG tablet    Sig: Take 1 tablet (10 mg total) by mouth at bedtime.    Dispense:  90 tablet    Refill:  1  . potassium chloride (KLOR-CON) 10 MEQ tablet    Sig:  Take 1 tablet (10 mEq total) by mouth 3 (three) times daily.    Dispense:  270 tablet    Refill:  1  . spironolactone (ALDACTONE) 25 MG tablet    Sig: Take 1 tablet (25 mg total) by mouth daily.    Dispense:  90 tablet    Refill:  1  . levocetirizine (XYZAL) 5 MG tablet    Sig: Take 1 tablet (5 mg total) by mouth every evening.    Dispense:  90 tablet    Refill:  1  . clobetasol (TEMOVATE) 0.05 % external solution    Sig: Apply topically 2 (two) times daily.    Dispense:  50 mL    Refill:  3   In addition to time spent on CPE, I spent 50 minutes in preparing to see the patient by review of recent labs, imaging and procedures, obtaining and reviewing separately obtained history, communicating with the patient and family or caregiver, ordering medications, tests or procedures, and documenting clinical information in the EHR including the differential Dx, treatment, and any further evaluation and other management of 1. Essential hypertension, benign 2. B12 deficiency 3. Vitamin D deficiency disease 4. Prediabetes 5. Other iron deficiency anemias 6. Seasonal allergic rhinitis due to pollen 7. Urticaria, chronic 8. Diuretic-induced hypokalemia 9. Perennial allergic rhinitis 10. Gastroesophageal reflux disease with esophagitis without hemorrhage 11. Intrinsic eczema  Follow-up: Return in about 3 months (around 07/01/2020).  Scarlette Calico, MD

## 2020-04-01 NOTE — Patient Instructions (Signed)

## 2020-04-02 ENCOUNTER — Encounter: Payer: Self-pay | Admitting: Internal Medicine

## 2020-04-02 LAB — HIV ANTIBODY (ROUTINE TESTING W REFLEX): HIV 1&2 Ab, 4th Generation: NONREACTIVE

## 2020-04-21 ENCOUNTER — Encounter: Payer: Self-pay | Admitting: Internal Medicine

## 2020-04-30 ENCOUNTER — Ambulatory Visit (INDEPENDENT_AMBULATORY_CARE_PROVIDER_SITE_OTHER): Payer: 59 | Admitting: *Deleted

## 2020-04-30 ENCOUNTER — Other Ambulatory Visit: Payer: Self-pay

## 2020-04-30 DIAGNOSIS — E538 Deficiency of other specified B group vitamins: Secondary | ICD-10-CM | POA: Diagnosis not present

## 2020-04-30 MED ORDER — CYANOCOBALAMIN 1000 MCG/ML IJ SOLN
1000.0000 ug | Freq: Once | INTRAMUSCULAR | Status: AC
  Administered 2020-04-30: 1000 ug via INTRAMUSCULAR

## 2020-04-30 NOTE — Progress Notes (Addendum)
Pls cosign for B12 inj since PCP is out of the office../lmb  I have reviewed and agree 

## 2020-05-02 ENCOUNTER — Ambulatory Visit: Payer: 59

## 2020-05-05 NOTE — Progress Notes (Signed)
b12 Injection given.   Oshen Wlodarczyk J Damante Spragg, MD  

## 2020-05-09 NOTE — Progress Notes (Deleted)
Office Visit Note  Patient: Samantha Ball             Date of Birth: October 11, 1976           MRN: [id number]             PCP: [name], MD Referring: [name], MD Visit Date: 05/23/2020 Occupation: @GUAROCC @  Subjective:  No chief complaint on file.   History of Present Illness: Samantha Ball is a 44 y.o. female ***   Activities of Daily Living:  Patient reports morning stiffness for *** {minute/hour:19697}.   Patient {ACTIONS;DENIES/REPORTS:21021675::"Denies"} nocturnal pain.  Difficulty dressing/grooming: {ACTIONS;DENIES/REPORTS:21021675::"Denies"} Difficulty climbing stairs: {ACTIONS;DENIES/REPORTS:21021675::"Denies"} Difficulty getting out of chair: {ACTIONS;DENIES/REPORTS:21021675::"Denies"} Difficulty using hands for taps, buttons, cutlery, and/or writing: {ACTIONS;DENIES/REPORTS:21021675::"Denies"}  No Rheumatology ROS completed.   PMFS History:  Patient Active Problem List   Diagnosis Date Noted  . Routine general medical examination at a health care facility 04/01/2020  . Intrinsic eczema 04/01/2020  . Vitamin D deficiency disease 11/21/2018  . B12 deficiency 11/21/2018  . Perennial allergic rhinitis 03/06/2018  . Prediabetes 01/18/2018  . Seasonal allergic rhinitis due to pollen 05/09/2017  . Hyperlipidemia LDL goal <130 12/21/2016  . Snoring 11/09/2016  . Other iron deficiency anemias 12/17/2014  . Insomnia 01/09/2014  . GERD (gastroesophageal reflux disease) 01/09/2014  . Constipation 01/09/2014  . Diuretic-induced hypokalemia 01/09/2014  . Positive ANA (antinuclear antibody) 07/16/2013  . Essential hypertension, benign 07/11/2013  . Visit for screening mammogram 07/11/2013  . Obesity (BMI 30.0-34.9) 07/11/2013    Past Medical History:  Diagnosis Date  . Anemia   . Hyperlipidemia   . Hypertension   . Intrinsic eczema 04/01/2020  . Obesity   . Sleep apnea   . Urticaria     Family History  Problem Relation Age of Onset  . Heart  disease Father   . Diabetes Maternal Aunt   . Breast cancer Maternal Grandmother   . Allergic rhinitis Daughter   . Eczema Daughter   . Alcohol abuse Neg Hx   . COPD Neg Hx   . Depression Neg Hx   . Drug abuse Neg Hx   . Early death Neg Hx   . Hearing loss Neg Hx   . Hyperlipidemia Neg Hx   . Hypertension Neg Hx   . Kidney disease Neg Hx   . Stroke Neg Hx   . Colon cancer Neg Hx   . Rectal cancer Neg Hx   . Stomach cancer Neg Hx    Past Surgical History:  Procedure Laterality Date  . TUBAL LIGATION    . wisdom teeth  2000   Social History   Social History Narrative  . Not on file   Immunization History  Administered Date(s) Administered  . Influenza,inj,Quad PF,6+ Mos 02/04/2016, 11/09/2016, 01/18/2018, 11/20/2018, 09/12/2019  . Influenza-Unspecified 01/09/2014  . Tdap 04/25/2013     Objective: Vital Signs: There were no vitals taken for this visit.   Physical Exam   Musculoskeletal Exam: ***  CDAI Exam: CDAI Score: -- Patient Global: --; Provider Global: -- Swollen: --; Tender: -- Joint Exam 05/23/2020   No joint exam has been documented for this visit   There is currently no information documented on the homunculus. Go to the Rheumatology activity and complete the homunculus joint exam.  Investigation: No additional findings.  Imaging: No results found.  Recent Labs: Lab Results  Component Value Date   WBC 7.0 04/01/2020   HGB 12.6 04/01/2020   PLT 277.0 04/01/2020  NA 135 04/01/2020   K 3.9 04/01/2020   CL 104 04/01/2020   CO2 26 04/01/2020   GLUCOSE 106 (H) 04/01/2020   BUN 7 04/01/2020   CREATININE 0.73 04/01/2020   BILITOT 0.5 01/11/2020   ALKPHOS 50 01/11/2020   AST 16 01/11/2020   ALT 17 01/11/2020   PROT 7.3 01/11/2020   ALBUMIN 4.2 01/11/2020   CALCIUM 9.7 04/01/2020   GFRAA 105 03/06/2018    Speciality Comments: No specialty comments available.  Procedures:  No procedures performed Allergies: Amlodipine   Assessment /  Plan:     Visit Diagnoses: No diagnosis found.  Orders: No orders of the defined types were placed in this encounter.  No orders of the defined types were placed in this encounter.   Face-to-face time spent with patient was *** minutes. Greater than 50% of time was spent in counseling and coordination of care.  Follow-Up Instructions: No follow-ups on file.   Ofilia Neas, PA-C  Note - This record has been created using Dragon software.  Chart creation errors have been sought, but may not always  have been located. Such creation errors do not reflect on  the standard of medical care.

## 2020-05-13 ENCOUNTER — Other Ambulatory Visit: Payer: Self-pay | Admitting: Internal Medicine

## 2020-05-22 NOTE — Progress Notes (Signed)
Office Visit Note  Patient: Samantha Ball             Date of Birth: May 27, 1976           MRN: [id number]             PCP: [name], MD Referring: [name], MD Visit Date: 05/27/2020 Occupation: @GUAROCC @  Subjective:  Hair loss  History of Present Illness: Samantha Ball is a 44 y.o. female with history of positive ANA and osteoarthritis.  The patient's last office visit was on 05/25/2019.  She reports that since then she has developed several new symptoms that she would like to discuss.  Since last summer she has been noticing hair loss on the crown of her scalp.  She is evaluated by her dermatologist and according to the patient no recommendations were made.  She denies any recent rashes.  She states that she does have photosensitivity and tries to avoid sun exposure.  She experiences intermittent oral ulcerations but denies any nasal ulcerations.  She denies any sicca symptoms.  She has intermittent symptoms of Raynaud's but no digital stations.  She continues to have chronic pain in both knee joints but denies any joint swelling.  She has ongoing right lateral epicondylitis and has had discomfort in the right wrist which she attributes to overuse activities while at work.   Activities of Daily Living:  Patient reports morning stiffness for  30 minutes.   Patient Reports nocturnal pain.  Difficulty dressing/grooming: Denies Difficulty climbing stairs: Reports Difficulty getting out of chair: Denies Difficulty using hands for taps, buttons, cutlery, and/or writing: Denies  Review of Systems  Constitutional: Negative for fatigue.  HENT: Positive for mouth sores and nosebleeds. Negative for mouth dryness and nose dryness.   Eyes: Negative for pain, itching, visual disturbance and dryness.  Respiratory: Negative for cough, hemoptysis, shortness of breath and difficulty breathing.   Cardiovascular: Negative for chest pain, palpitations, hypertension and swelling in  legs/feet.  Gastrointestinal: Positive for blood in stool and constipation. Negative for diarrhea.  Endocrine: Negative for increased urination.  Genitourinary: Negative for difficulty urinating and painful urination.  Musculoskeletal: Positive for arthralgias, joint pain and morning stiffness. Negative for joint swelling, myalgias, muscle weakness, muscle tenderness and myalgias.  Skin: Negative for color change, pallor, rash, hair loss, nodules/bumps, redness, skin tightness, ulcers and sensitivity to sunlight.  Allergic/Immunologic: Negative for susceptible to infections.  Neurological: Negative for dizziness, numbness and memory loss.  Hematological: Negative for swollen glands.  Psychiatric/Behavioral: Negative for depressed mood, confusion and sleep disturbance. The patient is not nervous/anxious.     PMFS History:  Patient Active Problem List   Diagnosis Date Noted  . Routine general medical examination at a health care facility 04/01/2020  . Intrinsic eczema 04/01/2020  . Vitamin D deficiency disease 11/21/2018  . B12 deficiency 11/21/2018  . Perennial allergic rhinitis 03/06/2018  . Prediabetes 01/18/2018  . Seasonal allergic rhinitis due to pollen 05/09/2017  . Hyperlipidemia LDL goal <130 12/21/2016  . Snoring 11/09/2016  . Other iron deficiency anemias 12/17/2014  . Insomnia 01/09/2014  . GERD (gastroesophageal reflux disease) 01/09/2014  . Constipation 01/09/2014  . Diuretic-induced hypokalemia 01/09/2014  . Positive ANA (antinuclear antibody) 07/16/2013  . Essential hypertension, benign 07/11/2013  . Visit for screening mammogram 07/11/2013  . Obesity (BMI 30.0-34.9) 07/11/2013    Past Medical History:  Diagnosis Date  . Anemia   . Fibroids    Dr. Charlesetta Garibaldi, per patient.   Marland Kitchen  Hyperlipidemia   . Hypertension   . Intrinsic eczema 04/01/2020  . Obesity   . Sleep apnea   . Urticaria     Family History  Problem Relation Age of Onset  . Heart disease Father   .  Diabetes Maternal Aunt   . Breast cancer Maternal Grandmother   . Allergic rhinitis Daughter   . Eczema Daughter   . Alcohol abuse Neg Hx   . COPD Neg Hx   . Depression Neg Hx   . Drug abuse Neg Hx   . Early death Neg Hx   . Hearing loss Neg Hx   . Hyperlipidemia Neg Hx   . Hypertension Neg Hx   . Kidney disease Neg Hx   . Stroke Neg Hx   . Colon cancer Neg Hx   . Rectal cancer Neg Hx   . Stomach cancer Neg Hx    Past Surgical History:  Procedure Laterality Date  . TUBAL LIGATION    . wisdom teeth  2000   Social History   Social History Narrative  . Not on file   Immunization History  Administered Date(s) Administered  . Influenza,inj,Quad PF,6+ Mos 02/04/2016, 11/09/2016, 01/18/2018, 11/20/2018, 09/12/2019  . Influenza-Unspecified 01/09/2014  . Tdap 04/25/2013     Objective: Vital Signs: BP (!) 134/91 (BP Location: Left Arm, Patient Position: Sitting, Cuff Size: Normal)   Pulse 85   Resp 14   Ht 5' 3.5" (1.613 m)   Wt 220 lb 9.6 oz (100.1 kg)   BMI 38.46 kg/m    Physical Exam Vitals and nursing note reviewed.  Constitutional:      Appearance: She is well-developed.  HENT:     Head: Normocephalic and atraumatic.  Eyes:     Conjunctiva/sclera: Conjunctivae normal.  Pulmonary:     Effort: Pulmonary effort is normal.  Abdominal:     General: Bowel sounds are normal.     Palpations: Abdomen is soft.  Musculoskeletal:     Cervical back: Normal range of motion.  Lymphadenopathy:     Cervical: No cervical adenopathy.  Skin:    General: Skin is warm and dry.     Capillary Refill: Capillary refill takes less than 2 seconds.  Neurological:     Mental Status: She is alert and oriented to person, place, and time.  Psychiatric:        Behavior: Behavior normal.      Musculoskeletal Exam: C-spine, thoracic spine, lumbar spine good range of motion.  Shoulder joints, elbow joints, wrist joints, MCPs, PIPs and DIPs good range of motion with no synovitis.  She has  complete fist formation bilaterally.  Hip joints have good range of motion with no discomfort.  Knee joints have good range of motion with no warmth or effusion.  She has bilateral knee crepitus.  Ankle joints have good range of motion without tenderness or inflammation.  CDAI Exam: CDAI Score: -- Patient Global: --; Provider Global: -- Swollen: --; Tender: -- Joint Exam 05/27/2020   No joint exam has been documented for this visit   There is currently no information documented on the homunculus. Go to the Rheumatology activity and complete the homunculus joint exam.  Investigation: No additional findings.  Imaging: No results found.  Recent Labs: Lab Results  Component Value Date   WBC 7.0 04/01/2020   HGB 12.6 04/01/2020   PLT 277.0 04/01/2020   NA 135 04/01/2020   K 3.9 04/01/2020   CL 104 04/01/2020   CO2 26 04/01/2020  GLUCOSE 106 (H) 04/01/2020   BUN 7 04/01/2020   CREATININE 0.73 04/01/2020   BILITOT 0.5 01/11/2020   ALKPHOS 50 01/11/2020   AST 16 01/11/2020   ALT 17 01/11/2020   PROT 7.3 01/11/2020   ALBUMIN 4.2 01/11/2020   CALCIUM 9.7 04/01/2020   GFRAA 105 03/06/2018    Speciality Comments: No specialty comments available.  Procedures:  No procedures performed Allergies: Amlodipine   Assessment / Plan:     Visit Diagnoses: Positive ANA (antinuclear antibody) - AVISE: ANA 1: 320 speckled, ENA negative, CB CAP negative, antiphospholipid negative, beta-2 GP 1-, thyroid antibodies negative: AVISE labs obtained on 05/03/2019 were reviewed with the patient today in the office.  Since her last office visit on 05/25/2019 she has developed several new concerns including hair loss, photosensitivity, intermittent symptoms of Raynaud's, and recurrent oral ulcerations.  She was evaluated by her dermatologist she has no developed any rashes.   No malar rash was noted on exam.  No digital ulcerations or signs of gangrene were noted.  She has not had any sicca symptoms.  She  continues to have chronic pain in both knee joints.  X-rays of both knees were obtained on 05/01/2019 which were consistent with mild osteoarthritis and moderate chondromalacia patella.  She has good range of motion of both knee joints on exam today.  She has bilateral knee crepitus.  No warmth or effusion of knee joints noted.  She has no synovitis in any other joints at this time.  We will obtain repeat AVISE labs.  She will follow up in 4 weeks to assess.   Primary osteoarthritis of both hands: She is not having any discomfort in her hands at this time.  No inflammation was noted.  She has complete fist formation bilaterally.  She experiences occasional discomfort in her right wrist which she attributes to typing a lot at work.  She was given a handout of exercises to perform.  Joint protection and muscle strengthening were discussed.  Primary osteoarthritis of both knees - Mild osteoarthritis and moderate chondromalacia patella.  She has good range of motion of both knee joints on exam.  Bilateral knee crepitus noted.  No warmth or effusion was noted.  She has chronic pain in both knee joints.  She was given a handout of knee joint exercises to perform.  Trochanteric bursitis of left hip: Resolved.  Other insomnia: Overall she has been sleeping well at night.  Vitamin D deficiency: She is taking vitamin D 50,000 units once weekly.  Other medical conditions are listed as follows:  Essential hypertension, benign  Hyperlipidemia LDL goal <130  Prediabetes  B12 deficiency  Orders: No orders of the defined types were placed in this encounter.  No orders of the defined types were placed in this encounter.     Follow-Up Instructions: Return in about 4 weeks (around 06/24/2020) for Positive ANA, Osteoarthritis.   Hazel Sams, PA-C   I examined and evaluated the patient with Hazel Sams PA.  Patient has history of infrequent oral ulcers, fatigue, hair loss.  She has had positive ANA in the  past.  We will check labs again today.  She had no synovitis on examination today.  She will come back to discuss labs in 1 month.  The plan of care was discussed as noted above.  Bo Merino, MD  Note - This record has been created using Editor, commissioning.  Chart creation errors have been sought, but may not always  have been located. Such  creation errors do not reflect on  the standard of medical care.

## 2020-05-23 ENCOUNTER — Ambulatory Visit: Payer: 59 | Admitting: Rheumatology

## 2020-05-27 ENCOUNTER — Ambulatory Visit: Payer: 59 | Admitting: Rheumatology

## 2020-05-27 ENCOUNTER — Other Ambulatory Visit: Payer: Self-pay

## 2020-05-27 ENCOUNTER — Encounter: Payer: Self-pay | Admitting: Rheumatology

## 2020-05-27 VITALS — BP 134/91 | HR 85 | Resp 14 | Ht 63.5 in | Wt 220.6 lb

## 2020-05-27 DIAGNOSIS — I10 Essential (primary) hypertension: Secondary | ICD-10-CM

## 2020-05-27 DIAGNOSIS — E559 Vitamin D deficiency, unspecified: Secondary | ICD-10-CM

## 2020-05-27 DIAGNOSIS — E785 Hyperlipidemia, unspecified: Secondary | ICD-10-CM

## 2020-05-27 DIAGNOSIS — M7062 Trochanteric bursitis, left hip: Secondary | ICD-10-CM

## 2020-05-27 DIAGNOSIS — R768 Other specified abnormal immunological findings in serum: Secondary | ICD-10-CM | POA: Diagnosis not present

## 2020-05-27 DIAGNOSIS — M19042 Primary osteoarthritis, left hand: Secondary | ICD-10-CM

## 2020-05-27 DIAGNOSIS — M170 Bilateral primary osteoarthritis of knee: Secondary | ICD-10-CM

## 2020-05-27 DIAGNOSIS — E538 Deficiency of other specified B group vitamins: Secondary | ICD-10-CM

## 2020-05-27 DIAGNOSIS — R7303 Prediabetes: Secondary | ICD-10-CM

## 2020-05-27 DIAGNOSIS — G4709 Other insomnia: Secondary | ICD-10-CM

## 2020-05-27 DIAGNOSIS — M19041 Primary osteoarthritis, right hand: Secondary | ICD-10-CM | POA: Diagnosis not present

## 2020-05-27 NOTE — Patient Instructions (Addendum)
Hand Exercises Hand exercises can be helpful for almost anyone. These exercises can strengthen the hands, improve flexibility and movement, and increase blood flow to the hands. These results can make work and daily tasks easier. Hand exercises can be especially helpful for people who have joint pain from arthritis or have nerve damage from overuse (carpal tunnel syndrome). These exercises can also help people who have injured a hand. Exercises Most of these hand exercises are gentle stretching and motion exercises. It is usually safe to do them often throughout the day. Warming up your hands before exercise may help to reduce stiffness. You can do this with gentle massage or by placing your hands in warm water for 10-15 minutes. It is normal to feel some stretching, pulling, tightness, or mild discomfort as you begin new exercises. This will gradually improve. Stop an exercise right away if you feel sudden, severe pain or your pain gets worse. Ask your health care provider which exercises are best for you. Knuckle bend or "claw" fist 1. Stand or sit with your arm, hand, and all five fingers pointed straight up. Make sure to keep your wrist straight during the exercise. 2. Gently bend your fingers down toward your palm until the tips of your fingers are touching the top of your palm. Keep your big knuckle straight and just bend the small knuckles in your fingers. 3. Hold this position for __________ seconds. 4. Straighten (extend) your fingers back to the starting position. Repeat this exercise 5-10 times with each hand. Full finger fist 1. Stand or sit with your arm, hand, and all five fingers pointed straight up. Make sure to keep your wrist straight during the exercise. 2. Gently bend your fingers into your palm until the tips of your fingers are touching the middle of your palm. 3. Hold this position for __________ seconds. 4. Extend your fingers back to the starting position, stretching every  joint fully. Repeat this exercise 5-10 times with each hand. Straight fist 1. Stand or sit with your arm, hand, and all five fingers pointed straight up. Make sure to keep your wrist straight during the exercise. 2. Gently bend your fingers at the big knuckle, where your fingers meet your hand, and the middle knuckle. Keep the knuckle at the tips of your fingers straight and try to touch the bottom of your palm. 3. Hold this position for __________ seconds. 4. Extend your fingers back to the starting position, stretching every joint fully. Repeat this exercise 5-10 times with each hand. Tabletop 1. Stand or sit with your arm, hand, and all five fingers pointed straight up. Make sure to keep your wrist straight during the exercise. 2. Gently bend your fingers at the big knuckle, where your fingers meet your hand, as far down as you can while keeping the small knuckles in your fingers straight. Think of forming a tabletop with your fingers. 3. Hold this position for __________ seconds. 4. Extend your fingers back to the starting position, stretching every joint fully. Repeat this exercise 5-10 times with each hand. Finger spread 1. Place your hand flat on a table with your palm facing down. Make sure your wrist stays straight as you do this exercise. 2. Spread your fingers and thumb apart from each other as far as you can until you feel a gentle stretch. Hold this position for __________ seconds. 3. Bring your fingers and thumb tight together again. Hold this position for __________ seconds. Repeat this exercise 5-10 times with each hand.   Making circles 1. Stand or sit with your arm, hand, and all five fingers pointed straight up. Make sure to keep your wrist straight during the exercise. 2. Make a circle by touching the tip of your thumb to the tip of your index finger. 3. Hold for __________ seconds. Then open your hand wide. 4. Repeat this motion with your thumb and each finger on your hand.  Repeat this exercise 5-10 times with each hand. Thumb motion 1. Sit with your forearm resting on a table and your wrist straight. Your thumb should be facing up toward the ceiling. Keep your fingers relaxed as you move your thumb. 2. Lift your thumb up as high as you can toward the ceiling. Hold for __________ seconds. 3. Bend your thumb across your palm as far as you can, reaching the tip of your thumb for the small finger (pinkie) side of your palm. Hold for __________ seconds. Repeat this exercise 5-10 times with each hand. Grip strengthening  1. Hold a stress ball or other soft ball in the middle of your hand. 2. Slowly increase the pressure, squeezing the ball as much as you can without causing pain. Think of bringing the tips of your fingers into the middle of your palm. All of your finger joints should bend when doing this exercise. 3. Hold your squeeze for __________ seconds, then relax. Repeat this exercise 5-10 times with each hand. Contact a health care provider if:  Your hand pain or discomfort gets much worse when you do an exercise.  Your hand pain or discomfort does not improve within 2 hours after you exercise. If you have any of these problems, stop doing these exercises right away. Do not do them again unless your health care provider says that you can. Get help right away if:  You develop sudden, severe hand pain or swelling. If this happens, stop doing these exercises right away. Do not do them again unless your health care provider says that you can. This information is not intended to replace advice given to you by your health care provider. Make sure you discuss any questions you have with your health care provider. Document Revised: 03/29/2019 Document Reviewed: 12/07/2018 Elsevier Patient Education  2020 [employer], 15(4), [phone]. Retrieved September 25, 2018 from [email or web address].com/[email or web address]">  Knee Exercises Ask your health  care provider which exercises are safe for you. Do exercises exactly as told by your health care provider and adjust them as directed. It is normal to feel mild stretching, pulling, tightness, or discomfort as you do these exercises. Stop right away if you feel sudden pain or your pain gets worse. Do not begin these exercises until told by your health care provider. Stretching and range-of-motion exercises These exercises warm up your muscles and joints and improve the movement and flexibility of your knee. These exercises also help to relieve pain and swelling. Knee extension, prone 1. Lie on your abdomen (prone position) on a bed. 2. Place your left / right knee just beyond the edge of the surface so your knee is not on the bed. You can put a towel under your left / right thigh just above your kneecap for comfort. 3. Relax your leg muscles and allow gravity to straighten your knee (extension). You should feel a stretch behind your left / right knee. 4. Hold this position for __________ seconds. 5. Scoot up so your knee is supported between repetitions. Repeat __________ times. Complete this exercise __________ times a  day. Knee flexion, active  1. Lie on your back with both legs straight. If this causes back discomfort, bend your left / right knee so your foot is flat on the floor. 2. Slowly slide your left / right heel back toward your buttocks. Stop when you feel a gentle stretch in the front of your knee or thigh (flexion). 3. Hold this position for __________ seconds. 4. Slowly slide your left / right heel back to the starting position. Repeat __________ times. Complete this exercise __________ times a day. Quadriceps stretch, prone  1. Lie on your abdomen on a firm surface, such as a bed or padded floor. 2. Bend your left / right knee and hold your ankle. If you cannot reach your ankle or pant leg, loop a belt around your foot and grab the belt instead. 3. Gently pull your heel toward  your buttocks. Your knee should not slide out to the side. You should feel a stretch in the front of your thigh and knee (quadriceps). 4. Hold this position for __________ seconds. Repeat __________ times. Complete this exercise __________ times a day. Hamstring, supine 1. Lie on your back (supine position). 2. Loop a belt or towel over the ball of your left / right foot. The ball of your foot is on the walking surface, right under your toes. 3. Straighten your left / right knee and slowly pull on the belt to raise your leg until you feel a gentle stretch behind your knee (hamstring). ? Do not let your knee bend while you do this. ? Keep your other leg flat on the floor. 4. Hold this position for __________ seconds. Repeat __________ times. Complete this exercise __________ times a day. Strengthening exercises These exercises build strength and endurance in your knee. Endurance is the ability to use your muscles for a long time, even after they get tired. Quadriceps, isometric This exercise stretches the muscles in front of your thigh (quadriceps) without moving your knee joint (isometric). 1. Lie on your back with your left / right leg extended and your other knee bent. Put a rolled towel or small pillow under your knee if told by your health care provider. 2. Slowly tense the muscles in the front of your left / right thigh. You should see your kneecap slide up toward your hip or see increased dimpling just above the knee. This motion will push the back of the knee toward the floor. 3. For __________ seconds, hold the muscle as tight as you can without increasing your pain. 4. Relax the muscles slowly and completely. Repeat __________ times. Complete this exercise __________ times a day. Straight leg raises This exercise stretches the muscles in front of your thigh (quadriceps) and the muscles that move your hips (hip flexors). 1. Lie on your back with your left / right leg extended and your  other knee bent. 2. Tense the muscles in the front of your left / right thigh. You should see your kneecap slide up or see increased dimpling just above the knee. Your thigh may even shake a bit. 3. Keep these muscles tight as you raise your leg 4-6 inches (10-15 cm) off the floor. Do not let your knee bend. 4. Hold this position for __________ seconds. 5. Keep these muscles tense as you lower your leg. 6. Relax your muscles slowly and completely after each repetition. Repeat __________ times. Complete this exercise __________ times a day. Hamstring, isometric 1. Lie on your back on a firm surface. 2. [place]  your left / right knee about __________ degrees. 3. Dig your left / right heel into the surface as if you are trying to pull it toward your buttocks. Tighten the muscles in the back of your thighs (hamstring) to "dig" as hard as you can without increasing any pain. 4. Hold this position for __________ seconds. 5. Release the tension gradually and allow your muscles to relax completely for __________ seconds after each repetition. Repeat __________ times. Complete this exercise __________ times a day. Hamstring curls If told by your health care provider, do this exercise while wearing ankle weights. Begin with __________ lb weights. Then increase the weight by 1 lb (0.5 kg) increments. Do not wear ankle weights that are more than __________ lb. 1. Lie on your abdomen with your legs straight. 2. Bend your left / right knee as far as you can without feeling pain. Keep your hips flat against the floor. 3. Hold this position for __________ seconds. 4. Slowly lower your leg to the starting position. Repeat __________ times. Complete this exercise __________ times a day. Squats This exercise strengthens the muscles in front of your thigh and knee (quadriceps). 1. Stand in front of a table, with your feet and knees pointing straight ahead. You may rest your hands on the table for balance but not for  support. 2. Slowly bend your knees and lower your hips like you are going to sit in a chair. ? Keep your weight over your heels, not over your toes. ? Keep your lower legs upright so they are parallel with the table legs. ? Do not let your hips go lower than your knees. ? Do not bend lower than told by your health care provider. ? If your knee pain increases, do not bend as low. 3. Hold the squat position for __________ seconds. 4. Slowly push with your legs to return to standing. Do not use your hands to pull yourself to standing. Repeat __________ times. Complete this exercise __________ times a day. Wall slides This exercise strengthens the muscles in front of your thigh and knee (quadriceps). 1. Lean your back against a smooth wall or door, and walk your feet out 18-24 inches (46-61 cm) from it. 2. Place your feet hip-width apart. 3. Slowly slide down the wall or door until your knees bend __________ degrees. Keep your knees over your heels, not over your toes. Keep your knees in line with your hips. 4. Hold this position for __________ seconds. Repeat __________ times. Complete this exercise __________ times a day. Straight leg raises This exercise strengthens the muscles that rotate the leg at the hip and move it away from your body (hip abductors). 1. Lie on your side with your left / right leg in the top position. Lie so your head, shoulder, knee, and hip line up. You may bend your bottom knee to help you keep your balance. 2. Roll your hips slightly forward so your hips are stacked directly over each other and your left / right knee is facing forward. 3. Leading with your heel, lift your top leg 4-6 inches (10-15 cm). You should feel the muscles in your outer hip lifting. ? Do not let your foot drift forward. ? Do not let your knee roll toward the ceiling. 4. Hold this position for __________ seconds. 5. Slowly return your leg to the starting position. 6. Let your muscles relax  completely after each repetition. Repeat __________ times. Complete this exercise __________ times a day. Straight leg raises This  exercise stretches the muscles that move your hips away from the front of the pelvis (hip extensors). 1. Lie on your abdomen on a firm surface. You can put a pillow under your hips if that is more comfortable. 2. Tense the muscles in your buttocks and lift your left / right leg about 4-6 inches (10-15 cm). Keep your knee straight as you lift your leg. 3. Hold this position for __________ seconds. 4. Slowly lower your leg to the starting position. 5. Let your leg relax completely after each repetition. Repeat __________ times. Complete this exercise __________ times a day. This information is not intended to replace advice given to you by your health care provider. Make sure you discuss any questions you have with your health care provider. Document Revised: 09/26/2018 Document Reviewed: 09/26/2018 Else[employer] Patient Education  [place] Ask your health care provider which exercises are safe for you. Do exercises exactly as told by your health care provider and adjust them as directed. It is normal to feel mild stretching, pulling, tightness, or discomfort as you do these exercises. Stop right away if you feel sudden pain or your pain gets worse. Do not begin these exercises until told by your health care provider. Stretching and range-of-motion exercises These exercises warm up your muscles and joints and improve the movement and flexibility of your elbow. These exercises also help to relieve pain, numbness, and tingling. Wrist flexion, assisted  6. Straighten your left / right elbow in front of you with your palm facing down toward the floor. ? If told by your health care provider, bend your left / right elbow to a 90-degree angle (right angle) at your side. 7. With your other hand, gently push over the back of your left / right hand  so your fingers point toward the floor (flexion). Stop when you feel a gentle stretch on the back of your forearm. 8. Hold this position for __________ seconds. Repeat __________ times. Complete this exercise __________ times a day. Wrist extension, assisted  5. Straighten your left / right elbow in front of you with your palm facing up toward the ceiling. ? If told by your health care provider, bend your left / right elbow to a 90-degree angle (right angle) at your side. 6. With your other hand, gently pull your left / right hand and fingers toward the floor (extension). Stop when you feel a gentle stretch on the palm side of your forearm. 7. Hold this position for __________ seconds. Repeat __________ times. Complete this exercise __________ times a day. Assisted forearm rotation, supination 5. Sit or stand with your left / right elbow bent to a 90-degree angle (right angle) at your side. 6. Using your uninjured hand, turn (rotate) your left / right palm up toward the ceiling (supination) until you feel a gentle stretch along the inside of your forearm. 7. Hold this position for __________ seconds. Repeat __________ times. Complete this exercise __________ times a day. Assisted forearm rotation, pronation 5. Sit or stand with your left / right elbow bent to a 90-degree angle (right angle) at your side. 6. Using your uninjured hand, rotate your left / right palm down toward the floor (pronation) until you feel a gentle stretch along the outside of your forearm. 7. Hold this position for __________ seconds. Repeat __________ times. Complete this exercise __________ times a day. Strengthening exercises These exercises build strength and endurance in your forearm and elbow. Endurance is the ability to  use your muscles for a long time, even after they get tired. Radial deviation  5. Stand with a __________ weight or a hammer in your left / right hand. Or, sit while holding a rubber exercise band  or tubing, with your left / right forearm supported on a table or countertop. ? If you are standing, position your forearm so that your thumb is facing forward. If you are sitting, position your forearm so that the thumb is facing the ceiling. This is the neutral position. 6. Raise your hand upward in front of you so your thumb moves toward the ceiling (radial deviation), or pull up on the rubber tubing. Keep your forearm and elbow still while you move your wrist only. 7. Hold this position for __________ seconds. 8. Slowly return to the starting position. Repeat __________ times. Complete this exercise __________ times a day. Wrist extension, eccentric 7. Sit with your left / right forearm palm-down and supported on a table or other surface. Let your left / right wrist extend over the edge of the surface. 8. Hold a __________ weight or a piece of exercise band or tubing in your left / right hand. ? If using a rubber exercise band or tubing, hold the other end of the tubing with your other hand. 9. Use your uninjured hand to move your left / right hand up toward the ceiling. 10. Take your uninjured hand away and slowly return to the starting position using only your left / right hand. Lowering your arm under tension is called eccentric extension. Repeat __________ times. Complete this exercise __________ times a day. Wrist extension Do not do this exercise if it causes pain at the outside of your elbow. Only do this exercise once instructed by your health care provider. 6. Sit with your left / right forearm supported on a table or other surface and your palm turned down toward the floor. Let your left / right wrist extend over the edge of the surface. 7. Hold a __________ weight or a piece of rubber exercise band or tubing. ? If you are using a rubber exercise band or tubing, hold the band or tubing in place with your other hand to provide resistance. 8. Slowly bend your wrist so your hand moves up  toward the ceiling (extension). Move only your wrist, keeping your forearm and elbow still. 9. Hold this position for __________ seconds. 10. Slowly return to the starting position. Repeat __________ times. Complete this exercise __________ times a day. Forearm rotation, supination To do this exercise, you will need a lightweight hammer or rubber mallet. 5. Sit with your left / right forearm supported on a table or other surface. Bend your elbow to a 90-degree angle (right angle). Position your forearm so that your palm is facing down toward the floor, with your hand resting over the edge of the table. 6. Hold a hammer in your left / right hand. ? To make this exercise easier, hold the hammer near the head of the hammer. ? To make this exercise harder, hold the hammer near the end of the handle. 7. Without moving your wrist or elbow, slowly rotate your forearm so your palm faces up toward the ceiling (supination). 8. Hold this position for __________ seconds. 9. Slowly return to the starting position. Repeat __________ times. Complete this exercise __________ times a day. Shoulder blade squeeze 5. Sit in a stable chair or stand with good posture. If you are sitting down, do not let your back touch  the back of the chair. 6. Your arms should be at your sides with your elbows bent to a 90-degree angle (right angle). Position your forearms so that your thumbs are facing the ceiling (neutral position). 7. Without lifting your shoulders up, squeeze your shoulder blades tightly together. 8. Hold this position for __________ seconds. 9. Slowly release and return to the starting position. Repeat __________ times. Complete this exercise __________ times a day. This information is not intended to replace advice given to you by your health care provider. Make sure you discuss any questions you have with your health care provider. Document Revised: 03/29/2019 Document Reviewed: 01/30/2019 Elsevier Patient  Education  [place].

## 2020-05-28 ENCOUNTER — Encounter: Payer: Self-pay | Admitting: Rheumatology

## 2020-05-28 ENCOUNTER — Other Ambulatory Visit: Payer: Self-pay

## 2020-05-28 ENCOUNTER — Ambulatory Visit (INDEPENDENT_AMBULATORY_CARE_PROVIDER_SITE_OTHER): Payer: 59 | Admitting: *Deleted

## 2020-05-28 DIAGNOSIS — E538 Deficiency of other specified B group vitamins: Secondary | ICD-10-CM

## 2020-05-28 MED ORDER — CYANOCOBALAMIN 1000 MCG/ML IJ SOLN
1000.0000 ug | Freq: Once | INTRAMUSCULAR | Status: AC
  Administered 2020-05-28: 1000 ug via INTRAMUSCULAR

## 2020-05-28 NOTE — Progress Notes (Signed)
Pls cosign for B12 inj../lmb  

## 2020-06-13 ENCOUNTER — Other Ambulatory Visit: Payer: Self-pay | Admitting: Internal Medicine

## 2020-06-24 ENCOUNTER — Other Ambulatory Visit: Payer: Self-pay

## 2020-06-24 ENCOUNTER — Ambulatory Visit: Payer: 59 | Admitting: Rheumatology

## 2020-06-24 ENCOUNTER — Encounter: Payer: Self-pay | Admitting: Rheumatology

## 2020-06-24 VITALS — BP 116/81 | HR 74 | Resp 12 | Ht 63.5 in | Wt 220.6 lb

## 2020-06-24 DIAGNOSIS — M170 Bilateral primary osteoarthritis of knee: Secondary | ICD-10-CM

## 2020-06-24 DIAGNOSIS — M19042 Primary osteoarthritis, left hand: Secondary | ICD-10-CM

## 2020-06-24 DIAGNOSIS — R7303 Prediabetes: Secondary | ICD-10-CM

## 2020-06-24 DIAGNOSIS — M19041 Primary osteoarthritis, right hand: Secondary | ICD-10-CM

## 2020-06-24 DIAGNOSIS — R768 Other specified abnormal immunological findings in serum: Secondary | ICD-10-CM | POA: Diagnosis not present

## 2020-06-24 DIAGNOSIS — G4709 Other insomnia: Secondary | ICD-10-CM

## 2020-06-24 DIAGNOSIS — M7062 Trochanteric bursitis, left hip: Secondary | ICD-10-CM

## 2020-06-24 DIAGNOSIS — E785 Hyperlipidemia, unspecified: Secondary | ICD-10-CM

## 2020-06-24 DIAGNOSIS — I10 Essential (primary) hypertension: Secondary | ICD-10-CM

## 2020-06-24 DIAGNOSIS — E538 Deficiency of other specified B group vitamins: Secondary | ICD-10-CM

## 2020-06-24 DIAGNOSIS — E559 Vitamin D deficiency, unspecified: Secondary | ICD-10-CM

## 2020-06-24 NOTE — Progress Notes (Signed)
Office Visit Note  Patient: Samantha Ball             Date of Birth: [birth year]           MRN: [id number]             PCP: [name], MD Referring: [name], MD Visit Date: 06/24/2020 Occupation: @GUAROCC @  Subjective:  Joint stiffness.   History of Present Illness: Samantha Ball is a 44 y.o. female with history of positive ANA and osteoarthritis.  She states she continues to have some stiffness in her joints.  Her joint pain has improved since the last visit.  She states she has been doing exercises which were given to her for her knee joints.  The trochanteric bursitis is improved as well.  She denies any history of nasal ulcers, malar rash, photosensitivity, sicca symptoms, Raynaud's phenomenon or lymphadenopathy.  She denies any history of joint swelling.  Activities of Daily Living:  Patient reports morning stiffness for 10 minutes.   Patient Denies nocturnal pain.  Difficulty dressing/grooming: Denies Difficulty climbing stairs: Reports Difficulty getting out of chair: Denies Difficulty using hands for taps, buttons, cutlery, and/or writing: Denies  Review of Systems  Constitutional: Positive for fatigue. Negative for night sweats, weight gain and weight loss.  HENT: Negative for mouth sores, trouble swallowing, trouble swallowing, mouth dryness and nose dryness.   Eyes: Negative for pain, redness, visual disturbance and dryness.  Respiratory: Negative for cough, shortness of breath and difficulty breathing.   Cardiovascular: Negative for chest pain, palpitations, hypertension, irregular heartbeat and swelling in legs/feet.  Gastrointestinal: Negative for blood in stool, constipation and diarrhea.  Endocrine: Negative for increased urination.  Genitourinary: Negative for vaginal dryness.  Musculoskeletal: Positive for arthralgias and joint pain. Negative for joint swelling, myalgias, muscle weakness, morning stiffness, muscle tenderness and myalgias.  Skin:  Negative for color change, rash, hair loss, skin tightness, ulcers and sensitivity to sunlight.  Allergic/Immunologic: Negative for susceptible to infections.  Neurological: Negative for dizziness, memory loss, night sweats and weakness.  Hematological: Negative for swollen glands.  Psychiatric/Behavioral: Positive for sleep disturbance. Negative for depressed mood. The patient is not nervous/anxious.     PMFS History:  Patient Active Problem List   Diagnosis Date Noted  . Routine general medical examination at a health care facility 04/01/2020  . Intrinsic eczema 04/01/2020  . Vitamin D deficiency disease 11/21/2018  . B12 deficiency 11/21/2018  . Perennial allergic rhinitis 03/06/2018  . Prediabetes 01/18/2018  . Seasonal allergic rhinitis due to pollen 05/09/2017  . Hyperlipidemia LDL goal <130 12/21/2016  . Snoring 11/09/2016  . Other iron deficiency anemias 12/17/2014  . Insomnia 01/09/2014  . GERD (gastroesophageal reflux disease) 01/09/2014  . Constipation 01/09/2014  . Diuretic-induced hypokalemia 01/09/2014  . Positive ANA (antinuclear antibody) 07/16/2013  . Essential hypertension, benign 07/11/2013  . Visit for screening mammogram 07/11/2013  . Obesity (BMI 30.0-34.9) 07/11/2013    Past Medical History:  Diagnosis Date  . Anemia   . Fibroids    Dr. Charlesetta Garibaldi, per patient.   . Hyperlipidemia   . Hypertension   . Intrinsic eczema 04/01/2020  . Obesity   . Sleep apnea   . Urticaria     Family History  Problem Relation Age of Onset  . Heart disease Father   . Diabetes Maternal Aunt   . Breast cancer Maternal Grandmother   . Allergic rhinitis Daughter   . Eczema Daughter   . Alcohol abuse Neg Hx   .  COPD Neg Hx   . Depression Neg Hx   . Drug abuse Neg Hx   . Early death Neg Hx   . Hearing loss Neg Hx   . Hyperlipidemia Neg Hx   . Hypertension Neg Hx   . Kidney disease Neg Hx   . Stroke Neg Hx   . Colon cancer Neg Hx   . Rectal cancer Neg Hx   . Stomach  cancer Neg Hx    Past Surgical History:  Procedure Laterality Date  . TUBAL LIGATION    . wisdom teeth  2000   Social History   Social History Narrative  . Not on file   Immunization History  Administered Date(s) Administered  . Influenza,inj,Quad PF,6+ Mos 02/04/2016, 11/09/2016, 01/18/2018, 11/20/2018, 09/12/2019  . Influenza-Unspecified 01/09/2014  . Tdap 04/25/2013     Objective: Vital Signs: BP 116/81 (BP Location: Left Arm, Patient Position: Sitting, Cuff Size: Large)   Pulse 74   Resp 12   Ht 5' 3.5" (1.613 m)   Wt 220 lb 9.6 oz (100.1 kg)   LMP 06/03/2020 (Approximate)   BMI 38.46 kg/m    Physical Exam Vitals and nursing note reviewed.  Constitutional:      Appearance: She is well-developed.  HENT:     Head: Normocephalic and atraumatic.  Eyes:     Conjunctiva/sclera: Conjunctivae normal.  Cardiovascular:     Rate and Rhythm: Normal rate and regular rhythm.     Heart sounds: Normal heart sounds.  Pulmonary:     Effort: Pulmonary effort is normal.     Breath sounds: Normal breath sounds.  Abdominal:     General: Bowel sounds are normal.     Palpations: Abdomen is soft.  Musculoskeletal:     Cervical back: Normal range of motion.  Lymphadenopathy:     Cervical: No cervical adenopathy.  Skin:    General: Skin is warm and dry.     Capillary Refill: Capillary refill takes less than 2 seconds.  Neurological:     Mental Status: She is alert and oriented to person, place, and time.  Psychiatric:        Behavior: Behavior normal.      Musculoskeletal Exam: C-spine was not with range of motion.  Shoulder joints, elbow joints, wrist joints, MCPs PIPs and DIPs with range of motion with no synovitis.  Hip joints, knee joints, ankles, MTPs PIPs and DIPs with good range of motion with no synovitis.  She had no tenderness over left trochanteric bursa.  CDAI Exam: CDAI Score: -- Patient Global: --; Provider Global: -- Swollen: --; Tender: -- Joint Exam  06/24/2020   No joint exam has been documented for this visit   There is currently no information documented on the homunculus. Go to the Rheumatology activity and complete the homunculus joint exam.  Investigation: No additional findings.  Imaging: No results found.  Recent Labs: Lab Results  Component Value Date   WBC 7.0 04/01/2020   HGB 12.6 04/01/2020   PLT 277.0 04/01/2020   NA 135 04/01/2020   K 3.9 04/01/2020   CL 104 04/01/2020   CO2 26 04/01/2020   GLUCOSE 106 (H) 04/01/2020   BUN 7 04/01/2020   CREATININE 0.73 04/01/2020   BILITOT 0.5 01/11/2020   ALKPHOS 50 01/11/2020   AST 16 01/11/2020   ALT 17 01/11/2020   PROT 7.3 01/11/2020   ALBUMIN 4.2 01/11/2020   CALCIUM 9.7 04/01/2020   GFRAA 105 03/06/2018   May 28, 2020 ANA 1: 320  homogeneous, ENA negative, CV Negative, Jo 1 -, anticardiolipin negative, beta-2 negative, phosphatidylserine negative, RF negative, anti-CCP negative, anticarP negative, antithyroglobulin negative, anti-TPO negative Speciality Comments: No specialty comments available.  Procedures:  No procedures performed Allergies: Amlodipine   Assessment / Plan:     Visit Diagnoses: Positive ANA (antinuclear antibody) - AVISE: ANA 1: 320 speckled, ENA negative, CB CAP negative, antiphospholipid negative, beta-2 GP 1-, thyroid antibodies negative.  Reviewed labs with patient at length.  She has no clinical features of autoimmune disease.  The features of autoimmune disease were also reviewed.  In case she develops any new symptoms she supposed to contact me.  Primary osteoarthritis of both hands-she has mild osteoarthritis.  Joint protection muscle strengthening was discussed.  Primary osteoarthritis of both knees-she has been doing exercises for lower extremity muscle strengthening which is helpful.  Trochanteric bursitis of left hip-improved.  Other insomnia-sleep hygiene was discussed.  Vitamin D deficiency  Essential hypertension,  benign-blood pressure is normal.  Hyperlipidemia LDL goal <130  Prediabetes  B12 deficiency  Orders: No orders of the defined types were placed in this encounter.  No orders of the defined types were placed in this encounter.     Follow-Up Instructions: Return in about 1 year (around 06/24/2021) for Osteoarthritis.   Bo Merino, MD  Note - This record has been created using Editor, commissioning.  Chart creation errors have been sought, but may not always  have been located. Such creation errors do not reflect on  the standard of medical care.

## 2020-06-30 ENCOUNTER — Ambulatory Visit (INDEPENDENT_AMBULATORY_CARE_PROVIDER_SITE_OTHER): Payer: 59 | Admitting: *Deleted

## 2020-06-30 ENCOUNTER — Other Ambulatory Visit: Payer: Self-pay

## 2020-06-30 ENCOUNTER — Ambulatory Visit: Payer: 59

## 2020-06-30 DIAGNOSIS — E538 Deficiency of other specified B group vitamins: Secondary | ICD-10-CM

## 2020-06-30 MED ORDER — CYANOCOBALAMIN 1000 MCG/ML IJ SOLN
1000.0000 ug | Freq: Once | INTRAMUSCULAR | Status: AC
  Administered 2020-06-30: 1000 ug via INTRAMUSCULAR

## 2020-06-30 NOTE — Progress Notes (Signed)
Pls cosign for B12 inj../lmb  

## 2020-07-31 ENCOUNTER — Other Ambulatory Visit: Payer: Self-pay

## 2020-07-31 ENCOUNTER — Ambulatory Visit (INDEPENDENT_AMBULATORY_CARE_PROVIDER_SITE_OTHER): Payer: 59 | Admitting: *Deleted

## 2020-07-31 DIAGNOSIS — E538 Deficiency of other specified B group vitamins: Secondary | ICD-10-CM

## 2020-07-31 MED ORDER — CYANOCOBALAMIN 1000 MCG/ML IJ SOLN
1000.0000 ug | Freq: Once | INTRAMUSCULAR | Status: AC
  Administered 2020-07-31: 1000 ug via INTRAMUSCULAR

## 2020-07-31 NOTE — Progress Notes (Signed)
Pls cosign for B12 inj../lmb  

## 2020-08-24 ENCOUNTER — Other Ambulatory Visit: Payer: Self-pay | Admitting: Internal Medicine

## 2020-09-01 ENCOUNTER — Ambulatory Visit (INDEPENDENT_AMBULATORY_CARE_PROVIDER_SITE_OTHER): Payer: 59

## 2020-09-01 ENCOUNTER — Other Ambulatory Visit: Payer: Self-pay

## 2020-09-01 DIAGNOSIS — E538 Deficiency of other specified B group vitamins: Secondary | ICD-10-CM

## 2020-09-01 MED ORDER — CYANOCOBALAMIN 1000 MCG/ML IJ SOLN
1000.0000 ug | Freq: Once | INTRAMUSCULAR | Status: AC
  Administered 2020-09-01: 1000 ug via INTRAMUSCULAR

## 2020-09-01 NOTE — Progress Notes (Signed)
Pt was given Vitamin B12. Pt tolerated well.

## 2020-09-01 NOTE — Progress Notes (Signed)
I have reviewed and agree.

## 2020-09-11 ENCOUNTER — Other Ambulatory Visit: Payer: Self-pay | Admitting: Internal Medicine

## 2020-09-11 DIAGNOSIS — L508 Other urticaria: Secondary | ICD-10-CM

## 2020-09-11 DIAGNOSIS — J3089 Other allergic rhinitis: Secondary | ICD-10-CM

## 2020-09-11 DIAGNOSIS — E876 Hypokalemia: Secondary | ICD-10-CM

## 2020-09-11 DIAGNOSIS — J301 Allergic rhinitis due to pollen: Secondary | ICD-10-CM

## 2020-09-11 DIAGNOSIS — T502X5A Adverse effect of carbonic-anhydrase inhibitors, benzothiadiazides and other diuretics, initial encounter: Secondary | ICD-10-CM

## 2020-09-11 DIAGNOSIS — I10 Essential (primary) hypertension: Secondary | ICD-10-CM

## 2020-10-02 ENCOUNTER — Other Ambulatory Visit: Payer: Self-pay

## 2020-10-02 ENCOUNTER — Ambulatory Visit (INDEPENDENT_AMBULATORY_CARE_PROVIDER_SITE_OTHER): Payer: 59 | Admitting: *Deleted

## 2020-10-02 DIAGNOSIS — E538 Deficiency of other specified B group vitamins: Secondary | ICD-10-CM | POA: Diagnosis not present

## 2020-10-02 MED ORDER — CYANOCOBALAMIN 1000 MCG/ML IJ SOLN
1000.0000 ug | Freq: Once | INTRAMUSCULAR | Status: AC
  Administered 2020-10-02: 1000 ug via INTRAMUSCULAR

## 2020-10-02 NOTE — Progress Notes (Signed)
Pls cosign for B12 inj../lmb  

## 2020-11-03 ENCOUNTER — Ambulatory Visit: Payer: 59

## 2020-11-07 ENCOUNTER — Telehealth: Payer: Self-pay

## 2020-11-07 ENCOUNTER — Ambulatory Visit: Payer: 59

## 2020-11-07 NOTE — Telephone Encounter (Signed)
Last order for B12 injections noted on 02/01/19.  Pt had OV 04/01/20 where it was noted by PCP B12 deficiency- She is not anemic and her B12 level is normal on high-dose oral B12 supplementation. LVM informing pt that we will need to schedule OV before able to given B12 injection.  Last recommendation on AVS in 04/01/20 was for pt to be seen in 70mo; pt has not had f/u.

## 2020-11-17 ENCOUNTER — Ambulatory Visit: Payer: 59 | Admitting: Internal Medicine

## 2020-11-17 ENCOUNTER — Encounter: Payer: Self-pay | Admitting: Internal Medicine

## 2020-11-17 ENCOUNTER — Other Ambulatory Visit: Payer: Self-pay

## 2020-11-17 VITALS — BP 130/88 | HR 64 | Temp 98.6°F | Resp 16 | Ht 63.5 in | Wt 219.0 lb

## 2020-11-17 DIAGNOSIS — R200 Anesthesia of skin: Secondary | ICD-10-CM

## 2020-11-17 DIAGNOSIS — M542 Cervicalgia: Secondary | ICD-10-CM

## 2020-11-17 DIAGNOSIS — D508 Other iron deficiency anemias: Secondary | ICD-10-CM

## 2020-11-17 DIAGNOSIS — T502X5A Adverse effect of carbonic-anhydrase inhibitors, benzothiadiazides and other diuretics, initial encounter: Secondary | ICD-10-CM

## 2020-11-17 DIAGNOSIS — E538 Deficiency of other specified B group vitamins: Secondary | ICD-10-CM

## 2020-11-17 DIAGNOSIS — Z23 Encounter for immunization: Secondary | ICD-10-CM | POA: Diagnosis not present

## 2020-11-17 DIAGNOSIS — I10 Essential (primary) hypertension: Secondary | ICD-10-CM

## 2020-11-17 DIAGNOSIS — E876 Hypokalemia: Secondary | ICD-10-CM

## 2020-11-17 DIAGNOSIS — R202 Paresthesia of skin: Secondary | ICD-10-CM

## 2020-11-17 DIAGNOSIS — E559 Vitamin D deficiency, unspecified: Secondary | ICD-10-CM

## 2020-11-17 DIAGNOSIS — G4452 New daily persistent headache (NDPH): Secondary | ICD-10-CM | POA: Insufficient documentation

## 2020-11-17 DIAGNOSIS — G8929 Other chronic pain: Secondary | ICD-10-CM | POA: Insufficient documentation

## 2020-11-17 DIAGNOSIS — G43009 Migraine without aura, not intractable, without status migrainosus: Secondary | ICD-10-CM | POA: Insufficient documentation

## 2020-11-17 MED ORDER — CYANOCOBALAMIN 1000 MCG/ML IJ SOLN
1000.0000 ug | Freq: Once | INTRAMUSCULAR | Status: AC
  Administered 2020-11-17: 1000 ug via INTRAMUSCULAR

## 2020-11-17 NOTE — Patient Instructions (Signed)
General Headache Without Cause A headache is pain or discomfort that is felt around the head or neck area. There are many causes and types of headaches. In some cases, the cause may not be found. Follow these instructions at home: Watch your condition for any changes. Let your doctor know about them. Take these steps to help with your condition: Managing pain      Take over-the-counter and prescription medicines only as told by your doctor.  Lie down in a dark, quiet room when you have a headache.  If told, put ice on your head and neck area: ? Put ice in a plastic bag. ? Place a towel between your skin and the bag. ? Leave the ice on for 20 minutes, 2-3 times per day.  If told, put heat on the affected area. Use the heat source that your doctor recommends, such as a moist heat pack or a heating pad. ? Place a towel between your skin and the heat source. ? Leave the heat on for 20-30 minutes. ? Remove the heat if your skin turns bright red. This is very important if you are unable to feel pain, heat, or cold. You may have a greater risk of getting burned.  Keep lights dim if bright lights bother you or make your headaches worse. Eating and drinking  Eat meals on a regular schedule.  If you drink alcohol: ? Limit how much you use to:  0-1 drink a day for women.  0-2 drinks a day for men. ? Be aware of how much alcohol is in your drink. In the [place] one drink equals one 12 oz bottle of beer (355 mL), one 5 oz glass of wine (148 mL), or one 1 oz glass of hard liquor (44 mL).  Stop drinking caffeine, or reduce how much caffeine you drink. General instructions   Keep a journal to find out if certain things bring on headaches. For example, write down: ? What you eat and drink. ? How much sleep you get. ? Any change to your diet or medicines.  Get a massage or try other ways to relax.  Limit stress.  Sit up straight. Do not tighten (tense) your muscles.  Do not use any  products that contain nicotine or tobacco. This includes cigarettes, e-cigarettes, and chewing tobacco. If you need help quitting, ask your doctor.  Exercise regularly as told by your doctor.  Get enough sleep. This often means 7-9 hours of sleep each night.  Keep all follow-up visits as told by your doctor. This is important. Contact a doctor if:  Your symptoms are not helped by medicine.  You have a headache that feels different than the other headaches.  You feel sick to your stomach (nauseous) or you throw up (vomit).  You have a fever. Get help right away if:  Your headache gets very bad quickly.  Your headache gets worse after a lot of physical activity.  You keep throwing up.  You have a stiff neck.  You have trouble seeing.  You have trouble speaking.  You have pain in the eye or ear.  Your muscles are weak or you lose muscle control.  You lose your balance or have trouble walking.  You feel like you will pass out (faint) or you pass out.  You are mixed up (confused).  You have a seizure. Summary  A headache is pain or discomfort that is felt around the head or neck area.  There are many causes and   types of headaches. In some cases, the cause may not be found.  Keep a journal to help find out what causes your headaches. Watch your condition for any changes. Let your doctor know about them.  Contact a doctor if you have a headache that is different from usual, or if your headache is not helped by medicine.  Get help right away if your headache gets very bad, you throw up, you have trouble seeing, you lose your balance, or you have a seizure. This information is not intended to replace advice given to you by your health care provider. Make sure you discuss any questions you have with your health care provider. Document Revised: 06/26/2018 Document Reviewed: 06/26/2018 [employer] Patient Education  2020 [employer]  

## 2020-11-17 NOTE — Progress Notes (Signed)
Subjective:  Patient ID: Samantha Ball, female    DOB: 1976-06-26  Age: 44 y.o. MRN: [id number]  CC: Headache, Hypertension, and Anemia  This visit occurred during the SARS-CoV-2 public health emergency.  Safety protocols were in place, including screening questions prior to the visit, additional usage of staff PPE, and extensive cleaning of exam room while observing appropriate contact time as indicated for disinfecting solutions.    HPI Samantha Ball presents for f/up -  She complains of worsening headaches over the last few months.  She has a history of migraine headaches and has not been getting symptom relief with naproxen or Excedrin.  She complains of a stabbing pain around her right eye that radiates back into the right side of her head.  She has recently had a few episodes of nausea and vomiting.  She said she has about 2 headaches per week.  She complains of photophobia and phonophobia.  She complains of intermittent, nonradiating neck pain and numbness and tingling in her upper extremities that is most prominent at night.  She tells me her blood pressure has been well controlled.  She is compliant with spironolactone and nebivolol.  She is active and denies any recent episodes of chest pain, shortness of breath, palpitations, edema, or fatigue.  She has a history of anemia, B12 deficiency, and iron deficiency.  She tells me she is compliant with her vitamin supplements.  She denies any recent sources of blood loss.  Outpatient Medications Prior to Visit  Medication Sig Dispense Refill  . b complex vitamins capsule Take 1 capsule by mouth daily.    Marland Kitchen BIOTIN PO Take by mouth daily.    Marland Kitchen BYSTOLIC 5 MG tablet TAKE 1 TABLET BY MOUTH EVERY DAY 90 tablet 0  . Cholecalciferol (DIALYVITE VITAMIN D 5000 PO) Take 5,000 Units by mouth daily.    . clobetasol (TEMOVATE) 0.05 % external solution Apply topically 2 (two) times daily. 50 mL 3  . Cyanocobalamin (VITAMIN B-12 IJ) Inject as  directed every 30 (thirty) days.    Marland Kitchen esomeprazole (NEXIUM) 40 MG capsule Take 1 capsule (40 mg total) by mouth daily. 90 capsule 1  . ferrous sulfate 325 (65 FE) MG tablet TAKE 1 TABLET BY MOUTH 3 TIMES DAILY WITH MEALS. 270 tablet 1  . levocetirizine (XYZAL) 5 MG tablet TAKE 1 TABLET BY MOUTH EVERY DAY IN THE EVENING 90 tablet 0  . montelukast (SINGULAIR) 10 MG tablet TAKE 1 TABLET BY MOUTH EVERYDAY AT BEDTIME 90 tablet 0  . naproxen (NAPROSYN) 375 MG tablet TAKE 1 TABLET BY MOUTH TWICE A DAY WITH MEALS 60 tablet 1  . potassium chloride (KLOR-CON) 10 MEQ tablet Take 1 tablet (10 mEq total) by mouth 3 (three) times daily. 270 tablet 1  . spironolactone (ALDACTONE) 25 MG tablet TAKE 1 TABLET BY MOUTH EVERY DAY 90 tablet 0   No facility-administered medications prior to visit.    ROS Review of Systems  Constitutional: Negative for appetite change, diaphoresis, fatigue and unexpected weight change.  HENT: Negative.  Negative for trouble swallowing.   Eyes: Positive for photophobia. Negative for visual disturbance.  Respiratory: Negative for cough, chest tightness, shortness of breath and wheezing.   Cardiovascular: Negative for chest pain, palpitations and leg swelling.  Gastrointestinal: Positive for nausea and vomiting. Negative for abdominal pain, blood in stool, constipation and diarrhea.  Endocrine: Negative.   Genitourinary: Negative for decreased urine volume, difficulty urinating, dysuria, frequency and urgency.  Musculoskeletal: Positive for neck pain.  Negative for arthralgias and back pain.  Skin: Negative.   Allergic/Immunologic: Negative.   Neurological: Positive for numbness and headaches. Negative for dizziness, tremors, seizures, syncope, speech difficulty, weakness and light-headedness.  Hematological: Negative for adenopathy. Does not bruise/bleed easily.  Psychiatric/Behavioral: Negative.     Objective:  BP 130/88   Pulse 64   Temp 98.6 F (37 C) (Oral)   Resp 16    Ht 5' 3.5" (1.613 m)   Wt 219 lb (99.3 kg)   SpO2 96%   BMI 38.19 kg/m   BP Readings from Last 3 Encounters:  11/17/20 130/88  06/24/20 116/81  05/27/20 (!) 134/91    Wt Readings from Last 3 Encounters:  11/17/20 219 lb (99.3 kg)  06/24/20 220 lb 9.6 oz (100.1 kg)  05/27/20 220 lb 9.6 oz (100.1 kg)    Physical Exam Vitals reviewed.  Constitutional:      General: She is not in acute distress.    Appearance: Normal appearance. She is well-developed. She is not ill-appearing, toxic-appearing or diaphoretic.  HENT:     Head: Normocephalic.     Nose: Nose normal.     Mouth/Throat:     Mouth: Mucous membranes are moist.  Eyes:     General: No scleral icterus.    Extraocular Movements: Extraocular movements intact.     Pupils: Pupils are equal, round, and reactive to light.  Cardiovascular:     Rate and Rhythm: Normal rate and regular rhythm.     Heart sounds: No murmur heard.   Pulmonary:     Effort: Pulmonary effort is normal.     Breath sounds: No stridor. No wheezing, rhonchi or rales.  Abdominal:     General: Abdomen is protuberant. Bowel sounds are normal. There is no distension.     Palpations: Abdomen is soft. There is no hepatomegaly, splenomegaly or mass.     Tenderness: There is no abdominal tenderness.  Musculoskeletal:        General: Normal range of motion.     Cervical back: Normal range of motion and neck supple.     Right lower leg: No edema.     Left lower leg: No edema.  Lymphadenopathy:     Cervical: No cervical adenopathy.  Skin:    General: Skin is warm and dry.     Coloration: Skin is not pale.  Neurological:     General: No focal deficit present.     Mental Status: She is alert and oriented to person, place, and time. Mental status is at baseline.     Sensory: No sensory deficit.     Motor: No weakness.     Gait: Gait normal.     Deep Tendon Reflexes: Reflexes normal.  Psychiatric:        Mood and Affect: Mood normal.        Behavior:  Behavior normal.        Thought Content: Thought content normal.        Judgment: Judgment normal.     Lab Results  Component Value Date   WBC 7.5 11/18/2020   HGB 12.4 11/18/2020   HCT 37.5 11/18/2020   PLT 293.0 11/18/2020   GLUCOSE 143 (H) 11/18/2020   CHOL 198 04/01/2020   TRIG 78.0 04/01/2020   HDL 43.80 04/01/2020   LDLDIRECT 99.0 12/21/2016   LDLCALC 138 (H) 04/01/2020   ALT 17 01/11/2020   AST 16 01/11/2020   NA 135 11/18/2020   K 3.9 11/18/2020   CL 104  11/18/2020   CREATININE 0.85 11/18/2020   BUN 8 11/18/2020   CO2 28 11/18/2020   TSH 1.91 11/20/2018   HGBA1C 5.7 04/01/2020    US Pelvic Complete With Transvaginal  Result Date: 01/28/2020 CLINICAL DATA:  Pelvic pain for 1 month, heavy menses at times EXAM: TRANSABDOMINAL AND TRANSVAGINAL ULTRASOUND OF PELVIS TECHNIQUE: Both transabdominal and transvaginal ultrasound examinations of the pelvis were performed. Transabdominal technique was performed for global imaging of the pelvis including uterus, ovaries, adnexal regions, and pelvic cul-de-sac. It was necessary to proceed with endovaginal exam following the transabdominal exam to visualize the ovaries, endometrium and adnexa. COMPARISON:  None FINDINGS: Uterus Measurements: 11.4 x 4.1 x 6.0 cm = volume: 148 mL. Anteverted. Large leiomyoma posterior LEFT uterus 4.7 x 3.5 x 4.5 cm, subserosal. No definite endometrial extension. No additional masses. Endometrium Thickness: 6 mm. Few nonspecific echogenic foci within endometrial complex with question of a focal hyperechoic nodule/polyp at upper uterine segment 13 x 6 x 13 mm. No endometrial fluid. Right ovary Measurements: 2.9 x 1.7 x 2.1 cm = volume: 5.4 mL. Normal morphology without mass Left ovary Measurements: 2.3 x 1.9 x 2.5 cm = volume: 5.8 mL. Normal morphology without mass Other findings No free pelvic fluid.  No adnexal masses. IMPRESSION: LEFT-sided uterine leiomyoma 4.7 cm diameter. Questionable endometrial polyp 13 x  6 x 13 mm; consider further evaluation with sonohysterogram for confirmation prior to hysteroscopy. Endometrial sampling should also be considered if patient is at high risk for endometrial carcinoma. (Ref: Radiological Reasoning: Algorithmic Workup of Abnormal Vaginal Bleeding with Endovaginal Sonography and Sonohysterography. AJR 2008; 696:V89-38) These results will be called to the ordering clinician or representative by the Radiologist Assistant, and communication documented in the PACS or zVision Dashboard. Electronically Signed   By: Lavonia Dana M.D.   On: 01/28/2020 17:05    Assessment & Plan:   Samantha Ball was seen today for headache, hypertension and anemia.  Diagnoses and all orders for this visit:  B12 deficiency- Her H&H and folate levels are normal.  She will continue parenteral B12 replacement therapy. -     CBC with Differential/Platelet; Future -     Folate; Future -     cyanocobalamin ((VITAMIN B-12)) injection 1,000 mcg  Essential hypertension, benign- Her blood pressure is adequately well controlled.  Electrolytes electrolytes and renal function are normal. -     Basic metabolic panel; Future  Other iron deficiency anemias- Her H&H and iron levels are normal.  She will continue the current iron supplement. -     Ferritin; Future -     Iron; Future  Vitamin D deficiency disease- Her vitamin D level is normal now. -     VITAMIN D 25 Hydroxy (Vit-D Deficiency, Fractures); Future  Diuretic-induced hypokalemia- Her potassium level is normal now. -     Magnesium; Future -     Basic metabolic panel; Future  Numbness and tingling of both upper extremities while sleeping- See below. -     MR Brain Wo Contrast; Future -     MR Cervical Spine Wo Contrast; Future  New daily persistent headache - She has a worsening headache with intermittent nausea and vomiting and upper extremity paresthesias.  I recommended that she undergo an MRI of the brain to screen for demyelination, mass,  tumor, NPH, or bleeding. -     MR Brain Wo Contrast; Future  Migraine without aura and without status migrainosus, not intractable- I recommended that she prevent and treat migraine headaches with  a daily dose of Qulipta.  Neck pain, chronic- She has neck pain and upper extremity paresthesias.  I recommended that she undergo an MRI of the neck to screen for demyelination, spinal stenosis, tumor, and DDD. -     MR Brain Wo Contrast; Future -     MR Cervical Spine Wo Contrast; Future  Cervicalgia -     MR Cervical Spine Wo Contrast; Future  Other orders -     Flu Vaccine QUAD 6+ mos PF IM (Fluarix Quad PF)   I am having Samantha Ball maintain her BIOTIN PO, Cyanocobalamin (VITAMIN B-12 IJ), esomeprazole, ferrous sulfate, potassium chloride, clobetasol, b complex vitamins, Cholecalciferol (DIALYVITE VITAMIN D 5000 PO), naproxen, spironolactone, Bystolic, levocetirizine, and montelukast. We administered cyanocobalamin.  Meds ordered this encounter  Medications  . cyanocobalamin ((VITAMIN B-12)) injection 1,000 mcg   I spent 50 minutes in preparing to see the patient by review of recent labs, imaging and procedures, obtaining and reviewing separately obtained history, communicating with the patient and family or caregiver, ordering medications, tests or procedures, and documenting clinical information in the EHR including the differential Dx, treatment, and any further evaluation and other management of 1. B12 deficiency 2. Essential hypertension, benign 3. Other iron deficiency anemias 4. Vitamin D deficiency disease 5. Diuretic-induced hypokalemia 6. Numbness and tingling of both upper extremities while sleeping 7. New daily persistent headache 8. Migraine without aura and without status migrainosus, not intractable 9. Neck pain, chronic 10. Cervicalgia     Follow-up: Return in about 3 months (around 02/16/2021).  Scarlette Calico, MD

## 2020-11-18 ENCOUNTER — Other Ambulatory Visit (INDEPENDENT_AMBULATORY_CARE_PROVIDER_SITE_OTHER): Payer: 59

## 2020-11-18 DIAGNOSIS — D508 Other iron deficiency anemias: Secondary | ICD-10-CM

## 2020-11-18 DIAGNOSIS — T502X5A Adverse effect of carbonic-anhydrase inhibitors, benzothiadiazides and other diuretics, initial encounter: Secondary | ICD-10-CM | POA: Diagnosis not present

## 2020-11-18 DIAGNOSIS — E559 Vitamin D deficiency, unspecified: Secondary | ICD-10-CM

## 2020-11-18 DIAGNOSIS — I10 Essential (primary) hypertension: Secondary | ICD-10-CM

## 2020-11-18 DIAGNOSIS — E538 Deficiency of other specified B group vitamins: Secondary | ICD-10-CM | POA: Diagnosis not present

## 2020-11-18 DIAGNOSIS — E876 Hypokalemia: Secondary | ICD-10-CM

## 2020-11-18 LAB — CBC WITH DIFFERENTIAL/PLATELET
Basophils Absolute: 0 10*3/uL (ref 0.0–0.1)
Basophils Relative: 0.6 % (ref 0.0–3.0)
Eosinophils Absolute: 0.1 10*3/uL (ref 0.0–0.7)
Eosinophils Relative: 1.4 % (ref 0.0–5.0)
HCT: 37.5 % (ref 36.0–46.0)
Hemoglobin: 12.4 g/dL (ref 12.0–15.0)
Lymphocytes Relative: 28.8 % (ref 12.0–46.0)
Lymphs Abs: 2.2 10*3/uL (ref 0.7–4.0)
MCHC: 33.1 g/dL (ref 30.0–36.0)
MCV: 79.5 fl (ref 78.0–100.0)
Monocytes Absolute: 0.4 10*3/uL (ref 0.1–1.0)
Monocytes Relative: 5.9 % (ref 3.0–12.0)
Neutro Abs: 4.7 10*3/uL (ref 1.4–7.7)
Neutrophils Relative %: 63.3 % (ref 43.0–77.0)
Platelets: 293 10*3/uL (ref 150.0–400.0)
RBC: 4.71 Mil/uL (ref 3.87–5.11)
RDW: 16.2 % — ABNORMAL HIGH (ref 11.5–15.5)
WBC: 7.5 10*3/uL (ref 4.0–10.5)

## 2020-11-18 LAB — MAGNESIUM: Magnesium: 1.7 mg/dL (ref 1.5–2.5)

## 2020-11-18 LAB — BASIC METABOLIC PANEL
BUN: 8 mg/dL (ref 6–23)
CO2: 28 mEq/L (ref 19–32)
Calcium: 10 mg/dL (ref 8.4–10.5)
Chloride: 104 mEq/L (ref 96–112)
Creatinine, Ser: 0.85 mg/dL (ref 0.40–1.20)
GFR: 83 mL/min (ref >60.00)
Glucose, Bld: 143 mg/dL — ABNORMAL HIGH (ref 70–99)
Potassium: 3.9 mEq/L (ref 3.5–5.1)
Sodium: 135 mEq/L (ref 135–145)

## 2020-11-18 LAB — FOLATE: Folate: 11.2 ng/mL (ref >5.9)

## 2020-11-18 LAB — VITAMIN D 25 HYDROXY (VIT D DEFICIENCY, FRACTURES): VITD: 30.36 ng/mL (ref 30.00–100.00)

## 2020-11-18 LAB — IRON: Iron: 43 ug/dL (ref 42–145)

## 2020-11-18 LAB — FERRITIN: Ferritin: 15.2 ng/mL (ref 10.0–291.0)

## 2020-11-26 ENCOUNTER — Other Ambulatory Visit: Payer: Self-pay | Admitting: Internal Medicine

## 2020-12-04 ENCOUNTER — Ambulatory Visit
Admission: RE | Admit: 2020-12-04 | Discharge: 2020-12-04 | Disposition: A | Discharge status: Home / Self Care | Payer: 59 | Source: Ambulatory Visit | Attending: Internal Medicine | Admitting: Internal Medicine

## 2020-12-04 ENCOUNTER — Other Ambulatory Visit: Payer: Self-pay

## 2020-12-04 DIAGNOSIS — G4452 New daily persistent headache (NDPH): Secondary | ICD-10-CM

## 2020-12-04 DIAGNOSIS — R202 Paresthesia of skin: Secondary | ICD-10-CM

## 2020-12-04 DIAGNOSIS — G8929 Other chronic pain: Secondary | ICD-10-CM

## 2020-12-04 DIAGNOSIS — M542 Cervicalgia: Secondary | ICD-10-CM

## 2020-12-04 DIAGNOSIS — R200 Anesthesia of skin: Secondary | ICD-10-CM

## 2020-12-07 ENCOUNTER — Other Ambulatory Visit: Payer: Self-pay | Admitting: Family

## 2020-12-07 DIAGNOSIS — J3089 Other allergic rhinitis: Secondary | ICD-10-CM

## 2020-12-07 DIAGNOSIS — E876 Hypokalemia: Secondary | ICD-10-CM

## 2020-12-07 DIAGNOSIS — I10 Essential (primary) hypertension: Secondary | ICD-10-CM

## 2020-12-07 DIAGNOSIS — T502X5A Adverse effect of carbonic-anhydrase inhibitors, benzothiadiazides and other diuretics, initial encounter: Secondary | ICD-10-CM

## 2020-12-08 ENCOUNTER — Ambulatory Visit (INDEPENDENT_AMBULATORY_CARE_PROVIDER_SITE_OTHER): Payer: 59 | Admitting: Family

## 2020-12-08 ENCOUNTER — Other Ambulatory Visit: Payer: Self-pay

## 2020-12-08 ENCOUNTER — Encounter: Payer: Self-pay | Admitting: Family

## 2020-12-08 VITALS — BP 130/78 | HR 81 | Temp 98.0°F | Ht 63.5 in | Wt 218.0 lb

## 2020-12-08 DIAGNOSIS — R399 Unspecified symptoms and signs involving the genitourinary system: Secondary | ICD-10-CM | POA: Diagnosis not present

## 2020-12-08 LAB — POCT URINALYSIS DIPSTICK
Bilirubin, UA: NEGATIVE
Blood, UA: NEGATIVE
Glucose, UA: NEGATIVE
Ketones, UA: NEGATIVE
Protein, UA: NEGATIVE
Spec Grav, UA: 1.025 (ref 1.010–1.025)
Urobilinogen, UA: 0.2 E.U./dL
pH, UA: 6 (ref 5.0–8.0)

## 2020-12-08 MED ORDER — NITROFURANTOIN MONOHYD MACRO 100 MG PO CAPS: 100.0000 mg | ORAL_CAPSULE | Freq: Two times a day (BID) | ORAL | 0 refills | Status: DC

## 2020-12-08 NOTE — Progress Notes (Signed)
Samantha Ball is a 44 y.o. female with the following history as recorded in [employer]:  Patient Active Problem List   Diagnosis Date Noted  . New daily persistent headache 11/17/2020  . Migraine without aura and without status migrainosus, not intractable 11/17/2020  . Neck pain, chronic 11/17/2020  . Routine general medical examination at a health care facility 04/01/2020  . Intrinsic eczema 04/01/2020  . Vitamin D deficiency disease 11/21/2018  . B12 deficiency 11/21/2018  . Perennial allergic rhinitis 03/06/2018  . Numbness and tingling of both upper extremities while sleeping 01/18/2018  . Prediabetes 01/18/2018  . Seasonal allergic rhinitis due to pollen 05/09/2017  . Hyperlipidemia LDL goal <130 12/21/2016  . Snoring 11/09/2016  . Other iron deficiency anemias 12/17/2014  . Insomnia 01/09/2014  . GERD (gastroesophageal reflux disease) 01/09/2014  . Constipation 01/09/2014  . Diuretic-induced hypokalemia 01/09/2014  . Positive ANA (antinuclear antibody) 07/16/2013  . Essential hypertension, benign 07/11/2013  . Visit for screening mammogram 07/11/2013  . Obesity (BMI 30.0-34.9) 07/11/2013    Current Outpatient Medications  Medication Sig Dispense Refill  . b complex vitamins capsule Take 1 capsule by mouth daily.    Marland Kitchen BIOTIN PO Take by mouth daily.    Marland Kitchen BYSTOLIC 5 MG tablet TAKE 1 TABLET BY MOUTH EVERY DAY 90 tablet 0  . Cholecalciferol (DIALYVITE VITAMIN D 5000 PO) Take 5,000 Units by mouth daily.    . clobetasol (TEMOVATE) 0.05 % external solution Apply topically 2 (two) times daily. 50 mL 3  . Cyanocobalamin (VITAMIN B-12 IJ) Inject as directed every 30 (thirty) days.    Marland Kitchen esomeprazole (NEXIUM) 40 MG capsule Take 1 capsule (40 mg total) by mouth daily. 90 capsule 1  . ferrous sulfate 325 (65 FE) MG tablet TAKE 1 TABLET BY MOUTH 3 TIMES DAILY WITH MEALS. 270 tablet 1  . levocetirizine (XYZAL) 5 MG tablet TAKE 1 TABLET BY MOUTH EVERY DAY IN THE EVENING 90 tablet 0  .  montelukast (SINGULAIR) 10 MG tablet TAKE 1 TABLET BY MOUTH EVERYDAY AT BEDTIME 90 tablet 0  . naproxen (NAPROSYN) 375 MG tablet TAKE 1 TABLET BY MOUTH TWICE A DAY WITH MEALS 60 tablet 1  . potassium chloride (KLOR-CON) 10 MEQ tablet Take 1 tablet (10 mEq total) by mouth 3 (three) times daily. 270 tablet 1  . spironolactone (ALDACTONE) 25 MG tablet TAKE 1 TABLET BY MOUTH EVERY DAY 90 tablet 0  . nitrofurantoin, macrocrystal-monohydrate, (MACROBID) 100 MG capsule Take 1 capsule (100 mg total) by mouth 2 (two) times daily. 14 capsule 0   No current facility-administered medications for this visit.    Allergies: Amlodipine  Past Medical History:  Diagnosis Date  . Anemia   . Fibroids    Dr. Charlesetta Garibaldi, per patient.   . Hyperlipidemia   . Hypertension   . Intrinsic eczema 04/01/2020  . Obesity   . Sleep apnea   . Urticaria     Past Surgical History:  Procedure Laterality Date  . TUBAL LIGATION    . wisdom teeth  2000    Family History  Problem Relation Age of Onset  . Heart disease Father   . Diabetes Maternal Aunt   . Breast cancer Maternal Grandmother   . Allergic rhinitis Daughter   . Eczema Daughter   . Alcohol abuse Neg Hx   . COPD Neg Hx   . Depression Neg Hx   . Drug abuse Neg Hx   . Early death Neg Hx   . Hearing loss Neg  Hx   . Hyperlipidemia Neg Hx   . Hypertension Neg Hx   . Kidney disease Neg Hx   . Stroke Neg Hx   . Colon cancer Neg Hx   . Rectal cancer Neg Hx   . Stomach cancer Neg Hx     Social History   Tobacco Use  . Smoking status: Never Smoker  . Smokeless tobacco: Never Used  Substance Use Topics  . Alcohol use: No    Subjective:  Concerned for UTI; strong smell to urine x 1-2 weeks; noted that she did see blood in her urine last Friday; no back pain or fever; had UTI in January and October of this year; will be getting hysterectomy due to fibroids- surgery yet to be scheduled;   Objective:  Vitals:   12/08/20 0849  BP: 130/78  Pulse: 81   Temp: 98 F (36.7 C)  TempSrc: Oral  SpO2: 99%  Weight: 218 lb (98.9 kg)  Height: 5' 3.5" (1.613 m)    General: Well developed, well nourished, in no acute distress  Head: Normocephalic and atraumatic  Lungs: Respirations unlabored;  CVS exam: normal rate and regular rhythm.  Neurologic: Alert and oriented; speech intact; face symmetrical; moves all extremities well; CNII-XII intact without focal deficit   Assessment:  1. UTI symptoms     Plan:  Check U/A and urine culture; Rx for Macrobid 100 mg bid x 7 days; follow-up to be determined;   This visit occurred during the SARS-CoV-2 public health emergency.  Safety protocols were in place, including screening questions prior to the visit, additional usage of staff PPE, and extensive cleaning of exam room while observing appropriate contact time as indicated for disinfecting solutions.     No follow-ups on file.  Orders Placed This Encounter  Procedures  . POCT urinalysis dipstick    Requested Prescriptions   Signed Prescriptions Disp Refills  . nitrofurantoin, macrocrystal-monohydrate, (MACROBID) 100 MG capsule 14 capsule 0    Sig: Take 1 capsule (100 mg total) by mouth 2 (two) times daily.

## 2020-12-08 NOTE — Addendum Note (Signed)
Addended by: Cresenciano Lick on: 12/08/2020 04:47 PM   Modules accepted: Orders

## 2020-12-10 LAB — URINE CULTURE

## 2020-12-24 ENCOUNTER — Other Ambulatory Visit: Payer: Self-pay | Admitting: Family

## 2020-12-24 DIAGNOSIS — I10 Essential (primary) hypertension: Secondary | ICD-10-CM

## 2021-01-23 ENCOUNTER — Other Ambulatory Visit: Payer: Self-pay | Admitting: Internal Medicine

## 2021-01-23 DIAGNOSIS — K2100 Gastro-esophageal reflux disease with esophagitis, without bleeding: Secondary | ICD-10-CM

## 2021-01-27 ENCOUNTER — Ambulatory Visit: Payer: 59 | Admitting: Podiatry

## 2021-01-27 ENCOUNTER — Other Ambulatory Visit: Payer: Self-pay

## 2021-01-27 DIAGNOSIS — M79675 Pain in left toe(s): Secondary | ICD-10-CM

## 2021-01-27 DIAGNOSIS — B351 Tinea unguium: Secondary | ICD-10-CM

## 2021-01-27 DIAGNOSIS — M79674 Pain in right toe(s): Secondary | ICD-10-CM

## 2021-01-27 MED ORDER — NONFORMULARY OR COMPOUNDED ITEM: 3 refills | Status: DC

## 2021-01-27 NOTE — Patient Instructions (Signed)
I have ordered a medication for you that will come from [facility] in [place]. They should be calling you to verify insurance and will mail the medication to you. If you live close by then you can go by their pharmacy to pick up the medication. Their phone number is [phone]. If you do not hear from them in the next few days, please give us a call at [phone].   

## 2021-01-30 ENCOUNTER — Other Ambulatory Visit: Payer: Self-pay

## 2021-01-30 ENCOUNTER — Ambulatory Visit (INDEPENDENT_AMBULATORY_CARE_PROVIDER_SITE_OTHER): Payer: Commercial Managed Care - PPO

## 2021-01-30 DIAGNOSIS — E538 Deficiency of other specified B group vitamins: Secondary | ICD-10-CM | POA: Diagnosis not present

## 2021-01-30 MED ORDER — CYANOCOBALAMIN 1000 MCG/ML IJ SOLN
1000.0000 ug | Freq: Once | INTRAMUSCULAR | Status: AC
  Administered 2021-01-30: 1000 ug via INTRAMUSCULAR

## 2021-01-30 NOTE — Progress Notes (Addendum)
Patient seen in office today for B12 injection ordered by Dr. Scarlette Calico. Patient tolerated injection well.  I have reviewed and agree

## 2021-02-03 NOTE — Progress Notes (Signed)
Subjective:   Patient ID: Samantha Ball, female   DOB: 45 y.o.   MRN: [id number]   HPI 45 year old female presents the office today for concerns of toenail fungus.  She has no significant risk allergies difficulty trimming herself.  She does get some occasional tenderness with pressure.  Denies emergency drainage or any swelling.  She has no other concerns today.   Review of Systems  All other systems reviewed and are negative.  Past Medical History:  Diagnosis Date  . Anemia   . Fibroids    Dr. Charlesetta Garibaldi, per patient.   . Hyperlipidemia   . Hypertension   . Intrinsic eczema 04/01/2020  . Obesity   . Sleep apnea   . Urticaria     Past Surgical History:  Procedure Laterality Date  . TUBAL LIGATION    . wisdom teeth  2000     Current Outpatient Medications:  .  BYSTOLIC 5 MG tablet, TAKE 1 TABLET BY MOUTH EVERY DAY, Disp: 90 tablet, Rfl: 0 .  levocetirizine (XYZAL) 5 MG tablet, TAKE 1 TABLET BY MOUTH EVERY DAY IN THE EVENING, Disp: 90 tablet, Rfl: 0 .  NONFORMULARY OR COMPOUNDED ITEM, Antifungal solution: Terbinafine 3%, Fluconazole 2%, Tea Tree Oil 5%, Urea 10%, Ibuprofen 2% in DMSO suspension #90mL, Disp: 1 each, Rfl: 3 .  spironolactone (ALDACTONE) 25 MG tablet, TAKE 1 TABLET BY MOUTH EVERY DAY, Disp: 90 tablet, Rfl: 0 .  b complex vitamins capsule, Take 1 capsule by mouth daily., Disp: , Rfl:  .  BIOTIN PO, Take by mouth daily., Disp: , Rfl:  .  Cholecalciferol (DIALYVITE VITAMIN D 5000 PO), Take 5,000 Units by mouth daily., Disp: , Rfl:  .  clobetasol (TEMOVATE) 0.05 % external solution, Apply topically 2 (two) times daily., Disp: 50 mL, Rfl: 3 .  Cyanocobalamin (VITAMIN B-12 IJ), Inject as directed every 30 (thirty) days., Disp: , Rfl:  .  esomeprazole (NEXIUM) 40 MG capsule, TAKE 1 CAPSULE BY MOUTH EVERY DAY, Disp: 90 capsule, Rfl: 1 .  ferrous sulfate 325 (65 FE) MG tablet, TAKE 1 TABLET BY MOUTH 3 TIMES DAILY WITH MEALS., Disp: 270 tablet, Rfl: 1 .  montelukast  (SINGULAIR) 10 MG tablet, TAKE 1 TABLET BY MOUTH EVERYDAY AT BEDTIME, Disp: 90 tablet, Rfl: 0 .  naproxen (NAPROSYN) 375 MG tablet, TAKE 1 TABLET BY MOUTH TWICE A DAY WITH MEALS, Disp: 60 tablet, Rfl: 1 .  nitrofurantoin, macrocrystal-monohydrate, (MACROBID) 100 MG capsule, Take 1 capsule (100 mg total) by mouth 2 (two) times daily., Disp: 14 capsule, Rfl: 0 .  potassium chloride (KLOR-CON) 10 MEQ tablet, Take 1 tablet (10 mEq total) by mouth 3 (three) times daily., Disp: 270 tablet, Rfl: 1  Allergies  Allergen Reactions  . Amlodipine     constipation         Objective:  Physical Exam  General: AAO x3, NAD  Dermatological: Nails are hypertrophic, dystrophic, brittle, discolored, elongated 10. No surrounding redness or drainage. No open lesions or pre-ulcerative lesions are identified today.  Vascular: Dorsalis Pedis artery and Posterior Tibial artery pedal pulses are 2/4 bilateral with immedate capillary fill time.There is no pain with calf compression, swelling, warmth, erythema.   Neruologic: Grossly intact via light touch bilateral.   Musculoskeletal: No gross boney pedal deformities bilateral. No pain, crepitus, or limitation noted with foot and ankle range of motion bilateral. Muscular strength 5/5 in all groups tested bilateral.  Gait: Unassisted, Nonantalgic.       Assessment:   Onychomycosis  Plan:  -Treatment options discussed including all alternatives, risks, and complications -Etiology of symptoms were discussed -As a courtesy I debrided the nails x10 without any complications or bleeding.  Discussed treatment options for nail fungus after discussion elects proceed with topical treatment.  I ordered a compound cream today through [employer].  Discussed success rates, application instructions and side effects.  Trula Slade DPM

## 2021-02-16 ENCOUNTER — Other Ambulatory Visit: Payer: Self-pay | Admitting: Internal Medicine

## 2021-02-27 ENCOUNTER — Other Ambulatory Visit: Payer: Self-pay

## 2021-02-27 ENCOUNTER — Ambulatory Visit (INDEPENDENT_AMBULATORY_CARE_PROVIDER_SITE_OTHER): Payer: Commercial Managed Care - PPO

## 2021-02-27 DIAGNOSIS — E538 Deficiency of other specified B group vitamins: Secondary | ICD-10-CM

## 2021-02-27 MED ORDER — CYANOCOBALAMIN 1000 MCG/ML IJ SOLN
1000.0000 ug | Freq: Once | INTRAMUSCULAR | Status: AC
  Administered 2021-02-27: 1000 ug via INTRAMUSCULAR

## 2021-02-27 NOTE — Progress Notes (Addendum)
Pt here for monthly B12 injection per Dr. Ronnald Ramp. B12 1000 mcg given in left IM, and patient tolerated injection well.  I have reviewed and agree

## 2021-03-19 ENCOUNTER — Other Ambulatory Visit: Payer: Self-pay | Admitting: Internal Medicine

## 2021-03-19 DIAGNOSIS — E876 Hypokalemia: Secondary | ICD-10-CM

## 2021-03-19 DIAGNOSIS — T502X5A Adverse effect of carbonic-anhydrase inhibitors, benzothiadiazides and other diuretics, initial encounter: Secondary | ICD-10-CM

## 2021-03-19 DIAGNOSIS — I10 Essential (primary) hypertension: Secondary | ICD-10-CM

## 2021-04-01 ENCOUNTER — Ambulatory Visit (INDEPENDENT_AMBULATORY_CARE_PROVIDER_SITE_OTHER): Payer: Commercial Managed Care - PPO

## 2021-04-01 ENCOUNTER — Other Ambulatory Visit: Payer: Self-pay

## 2021-04-01 DIAGNOSIS — E538 Deficiency of other specified B group vitamins: Secondary | ICD-10-CM

## 2021-04-01 MED ORDER — CYANOCOBALAMIN 1000 MCG/ML IJ SOLN
1000.0000 ug | INTRAMUSCULAR | Status: DC
  Administered 2021-04-01 – 2021-05-01 (×2): 1000 ug via INTRAMUSCULAR

## 2021-04-01 MED ORDER — CYANOCOBALAMIN 1000 MCG/ML IJ SOLN: 1000.0000 ug | Freq: Once | INTRAMUSCULAR | Status: DC

## 2021-04-01 NOTE — Progress Notes (Signed)
Pt here for monthly B12 injection per Dr.  Ronnald Ramp  B12 1045mcg given  Left arm IM, and pt tolerated injection well.  Next B12 injection scheduled for 05/01/21.

## 2021-04-27 ENCOUNTER — Encounter: Payer: Self-pay | Admitting: Podiatry

## 2021-04-27 ENCOUNTER — Ambulatory Visit: Payer: Commercial Managed Care - PPO | Admitting: Podiatry

## 2021-04-27 ENCOUNTER — Other Ambulatory Visit: Payer: Self-pay

## 2021-04-27 DIAGNOSIS — L608 Other nail disorders: Secondary | ICD-10-CM | POA: Insufficient documentation

## 2021-04-27 DIAGNOSIS — L600 Ingrowing nail: Secondary | ICD-10-CM | POA: Diagnosis not present

## 2021-04-27 DIAGNOSIS — B351 Tinea unguium: Secondary | ICD-10-CM | POA: Diagnosis not present

## 2021-04-27 DIAGNOSIS — H905 Unspecified sensorineural hearing loss: Secondary | ICD-10-CM | POA: Insufficient documentation

## 2021-04-27 DIAGNOSIS — M25569 Pain in unspecified knee: Secondary | ICD-10-CM | POA: Insufficient documentation

## 2021-04-27 NOTE — Patient Instructions (Signed)

## 2021-05-01 ENCOUNTER — Ambulatory Visit (INDEPENDENT_AMBULATORY_CARE_PROVIDER_SITE_OTHER): Payer: Commercial Managed Care - PPO

## 2021-05-01 ENCOUNTER — Other Ambulatory Visit: Payer: Self-pay

## 2021-05-01 DIAGNOSIS — E538 Deficiency of other specified B group vitamins: Secondary | ICD-10-CM

## 2021-05-01 NOTE — Progress Notes (Signed)
Pt here for monthly B12 injection per Dr. Jones  B12 1000mcg given right deltoid IM, and pt tolerated injection well.  Next B12 injection scheduled for 06/01/21  

## 2021-05-04 ENCOUNTER — Telehealth: Payer: Self-pay | Admitting: *Deleted

## 2021-05-04 MED ORDER — EFINACONAZOLE 10 % EX SOLN: 1.0000 [drp] | Freq: Every day | CUTANEOUS | 11 refills | Status: DC

## 2021-05-04 NOTE — Telephone Encounter (Signed)
Called and left a message for the patient stating that I was calling to check on the patient after having an ingrown toenail done and to call me back at [phone]. Lattie Haw

## 2021-05-04 NOTE — Progress Notes (Signed)
Subjective: 45 year old female presents the office today for follow-up evaluation of toenail fungus and she is currently using topical antifungal from [employer].  She has seen minimal improvement. The right big toenail is the most affected.  Denies any redness or drainage or any swelling to the toenail.  Gets occasional discomfort the nail but denies any redness or drainage or any signs of infection.     Objective: AAO x3, NAD DP/PT pulses palpable bilaterally, CRT less than 3 seconds Nails continue be hypertrophic, dystrophic with yellow-brown discoloration of the right hallux no significant with incurvation of the nail.  There is no surrounding erythema, ascending cellulitis.  No fluctuation crepitation.  No open lesions. No pain with calf compression, swelling, warmth, erythema  Assessment: Right hallux onychodystrophy  Plan: -All treatment options discussed with the patient including all alternatives, risks, complications.  -At this time I discussed with her total nail removal and she wishes to proceed with this. -At this time, recommended partial nail removal without chemical matricectomy to the right due to dystrophy, fungus. Risks and complications were discussed with the patient for which they understand and  verbally consent to the procedure. Under sterile conditions a total of 3 mL of a mixture of 2% lidocaine plain and 0.5% Marcaine plain was infiltrated in a hallux block fashion. Once anesthetized, the skin was prepped in sterile fashion. A tourniquet was then applied. Next the right hallux nail  was sharply excised making sure to remove the entire offending nail border. Once the nail was  Removed, the area was debrided and the underlying skin was intact. The area was irrigated and hemostasis was obtained.  A dry sterile dressing was applied. After application of the dressing the tourniquet was removed and there is found to be an immediate capillary refill time to the digit. The  patient tolerated the procedure well any complications. Post procedure instructions were discussed the patient for which he verbally understood. Follow-up in one week for nail check or sooner if any problems are to arise. Discussed signs/symptoms of worsening infection and directed to call the office immediately should any occur or go directly to the emergency room. In the meantime, encouraged to call the office with any questions, concerns, changes symptoms. -L sent for culture, pathology to [facility] -Patient encouraged to call the office with any questions, concerns, change in symptoms.   *incidentally ordered Jublia-I called the pharmacy and canceled this.  Trula Slade DPM

## 2021-05-07 ENCOUNTER — Other Ambulatory Visit: Payer: Self-pay | Admitting: Internal Medicine

## 2021-05-11 ENCOUNTER — Ambulatory Visit (INDEPENDENT_AMBULATORY_CARE_PROVIDER_SITE_OTHER): Payer: Commercial Managed Care - PPO | Admitting: Podiatry

## 2021-05-11 ENCOUNTER — Other Ambulatory Visit: Payer: Self-pay

## 2021-05-11 ENCOUNTER — Encounter: Payer: Self-pay | Admitting: Podiatry

## 2021-05-11 DIAGNOSIS — L600 Ingrowing nail: Secondary | ICD-10-CM | POA: Diagnosis not present

## 2021-05-11 DIAGNOSIS — B351 Tinea unguium: Secondary | ICD-10-CM | POA: Diagnosis not present

## 2021-05-11 NOTE — Patient Instructions (Signed)
If you don't hear from me about the nail culture in about a week, please call me at [phone] or send me a mychart message

## 2021-05-12 ENCOUNTER — Other Ambulatory Visit: Payer: Self-pay

## 2021-05-13 ENCOUNTER — Telehealth: Payer: Self-pay | Admitting: Internal Medicine

## 2021-05-13 ENCOUNTER — Ambulatory Visit (INDEPENDENT_AMBULATORY_CARE_PROVIDER_SITE_OTHER): Payer: Commercial Managed Care - PPO | Admitting: Internal Medicine

## 2021-05-13 ENCOUNTER — Encounter: Payer: Self-pay | Admitting: Internal Medicine

## 2021-05-13 ENCOUNTER — Other Ambulatory Visit: Payer: Self-pay

## 2021-05-13 VITALS — BP 138/88 | HR 77 | Temp 98.4°F | Resp 16 | Ht 63.5 in | Wt 222.0 lb

## 2021-05-13 DIAGNOSIS — Z124 Encounter for screening for malignant neoplasm of cervix: Secondary | ICD-10-CM | POA: Insufficient documentation

## 2021-05-13 DIAGNOSIS — E785 Hyperlipidemia, unspecified: Secondary | ICD-10-CM | POA: Diagnosis not present

## 2021-05-13 DIAGNOSIS — R399 Unspecified symptoms and signs involving the genitourinary system: Secondary | ICD-10-CM | POA: Diagnosis not present

## 2021-05-13 DIAGNOSIS — E118 Type 2 diabetes mellitus with unspecified complications: Secondary | ICD-10-CM | POA: Insufficient documentation

## 2021-05-13 DIAGNOSIS — I10 Essential (primary) hypertension: Secondary | ICD-10-CM

## 2021-05-13 DIAGNOSIS — R7303 Prediabetes: Secondary | ICD-10-CM | POA: Diagnosis not present

## 2021-05-13 DIAGNOSIS — Z1231 Encounter for screening mammogram for malignant neoplasm of breast: Secondary | ICD-10-CM

## 2021-05-13 DIAGNOSIS — R8281 Pyuria: Secondary | ICD-10-CM | POA: Insufficient documentation

## 2021-05-13 DIAGNOSIS — E538 Deficiency of other specified B group vitamins: Secondary | ICD-10-CM | POA: Diagnosis not present

## 2021-05-13 DIAGNOSIS — Z0000 Encounter for general adult medical examination without abnormal findings: Secondary | ICD-10-CM

## 2021-05-13 DIAGNOSIS — N3001 Acute cystitis with hematuria: Secondary | ICD-10-CM | POA: Insufficient documentation

## 2021-05-13 DIAGNOSIS — Z1159 Encounter for screening for other viral diseases: Secondary | ICD-10-CM | POA: Insufficient documentation

## 2021-05-13 LAB — LIPID PANEL
Cholesterol: 201 mg/dL — ABNORMAL HIGH (ref 0–200)
HDL: 38.8 mg/dL — ABNORMAL LOW (ref >39.00)
LDL Cholesterol: 141 mg/dL — ABNORMAL HIGH (ref 0–99)
NonHDL: 161.71
Total CHOL/HDL Ratio: 5
Triglycerides: 105 mg/dL (ref 0.0–149.0)
VLDL: 21 mg/dL (ref 0.0–40.0)

## 2021-05-13 LAB — BASIC METABOLIC PANEL
BUN: 8 mg/dL (ref 6–23)
CO2: 26 mEq/L (ref 19–32)
Calcium: 10.2 mg/dL (ref 8.4–10.5)
Chloride: 101 mEq/L (ref 96–112)
Creatinine, Ser: 0.82 mg/dL (ref 0.40–1.20)
GFR: 86.36 mL/min (ref >60.00)
Glucose, Bld: 114 mg/dL — ABNORMAL HIGH (ref 70–99)
Potassium: 3.8 mEq/L (ref 3.5–5.1)
Sodium: 135 mEq/L (ref 135–145)

## 2021-05-13 LAB — CBC WITH DIFFERENTIAL/PLATELET
Basophils Absolute: 0.1 10*3/uL (ref 0.0–0.1)
Basophils Relative: 0.7 % (ref 0.0–3.0)
Eosinophils Absolute: 0.1 10*3/uL (ref 0.0–0.7)
Eosinophils Relative: 1.5 % (ref 0.0–5.0)
HCT: 37.1 % (ref 36.0–46.0)
Hemoglobin: 12.4 g/dL (ref 12.0–15.0)
Lymphocytes Relative: 30.5 % (ref 12.0–46.0)
Lymphs Abs: 2.4 10*3/uL (ref 0.7–4.0)
MCHC: 33.5 g/dL (ref 30.0–36.0)
MCV: 77.8 fl — ABNORMAL LOW (ref 78.0–100.0)
Monocytes Absolute: 0.5 10*3/uL (ref 0.1–1.0)
Monocytes Relative: 7.1 % (ref 3.0–12.0)
Neutro Abs: 4.6 10*3/uL (ref 1.4–7.7)
Neutrophils Relative %: 60.2 % (ref 43.0–77.0)
Platelets: 359 10*3/uL (ref 150.0–400.0)
RBC: 4.77 Mil/uL (ref 3.87–5.11)
RDW: 17.9 % — ABNORMAL HIGH (ref 11.5–15.5)
WBC: 7.7 10*3/uL (ref 4.0–10.5)

## 2021-05-13 LAB — THYROID PANEL WITH TSH
Free Thyroxine Index: 2.7 (ref 1.4–3.8)
T3 Uptake: 29 % (ref 22–35)
T4, Total: 9.3 ug/dL (ref 5.1–11.9)
TSH: 1.96 mIU/L

## 2021-05-13 LAB — POCT URINALYSIS DIPSTICK
Bilirubin, UA: NEGATIVE
Blood, UA: NEGATIVE
Glucose, UA: NEGATIVE
Ketones, UA: NEGATIVE
Nitrite, UA: POSITIVE
Protein, UA: NEGATIVE
Spec Grav, UA: 1.015 (ref 1.010–1.025)
Urobilinogen, UA: 0.2 E.U./dL
pH, UA: 6 (ref 5.0–8.0)

## 2021-05-13 LAB — HEMOGLOBIN A1C: Hgb A1c MFr Bld: 6.5 % (ref 4.6–6.5)

## 2021-05-13 MED ORDER — NITROFURANTOIN MONOHYD MACRO 100 MG PO CAPS: 100.0000 mg | ORAL_CAPSULE | Freq: Two times a day (BID) | ORAL | 0 refills | Status: AC

## 2021-05-13 NOTE — Progress Notes (Signed)
Subjective:  Patient ID: Samantha Ball, female    DOB: November 25, 1976  Age: 45 y.o. MRN: [id number]  CC: Annual Exam, Urinary Tract Infection, Hyperlipidemia, and Hypertension  This visit occurred during the SARS-CoV-2 public health emergency.  Safety protocols were in place, including screening questions prior to the visit, additional usage of staff PPE, and extensive cleaning of exam room while observing appropriate contact time as indicated for disinfecting solutions.    HPI JINNIE ONLEY presents for a CPX and f/up -   She complains of a 2-week history of intermittent hematuria and dysuria.  She denies abdominal pain, pelvic pain, flank pain, nausea, vomiting, fever, or chills.  Outpatient Medications Prior to Visit  Medication Sig Dispense Refill  . b complex vitamins capsule Take 1 capsule by mouth daily.    Marland Kitchen BIOTIN PO Take by mouth daily.    Marland Kitchen BYSTOLIC 5 MG tablet TAKE 1 TABLET BY MOUTH EVERY DAY 90 tablet 0  . clobetasol (TEMOVATE) 0.05 % external solution Apply topically 2 (two) times daily. 50 mL 3  . Cyanocobalamin (VITAMIN B-12 IJ) Inject as directed every 30 (thirty) days.    . Efinaconazole 10 % SOLN Apply 1 drop topically daily. 4 mL 11  . ergocalciferol (VITAMIN D2) 1.25 MG (50000 UT) capsule Take 1 capsule by mouth every 7 (seven) days.    Marland Kitchen esomeprazole (NEXIUM) 40 MG capsule TAKE 1 CAPSULE BY MOUTH EVERY DAY 90 capsule 1  . ferrous sulfate 325 (65 FE) MG tablet TAKE 1 TABLET BY MOUTH 3 TIMES DAILY WITH MEALS. 270 tablet 1  . hydrochlorothiazide (HYDRODIURIL) 25 MG tablet Take 0.5 tablets by mouth daily.    Marland Kitchen levocetirizine (XYZAL) 5 MG tablet TAKE 1 TABLET BY MOUTH EVERY DAY IN THE EVENING 90 tablet 0  . naproxen (NAPROSYN) 375 MG tablet TAKE 1 TABLET BY MOUTH TWICE A DAY WITH MEALS 60 tablet 1  . NONFORMULARY OR COMPOUNDED ITEM Antifungal solution: Terbinafine 3%, Fluconazole 2%, Tea Tree Oil 5%, Urea 10%, Ibuprofen 2% in DMSO suspension #14mL 1 each 3  . potassium  chloride (KLOR-CON) 10 MEQ tablet Take 1 tablet (10 mEq total) by mouth 3 (three) times daily. 270 tablet 1  . spironolactone (ALDACTONE) 25 MG tablet TAKE 1 TABLET BY MOUTH EVERY DAY 90 tablet 0  . Cholecalciferol (DIALYVITE VITAMIN D 5000 PO) Take 5,000 Units by mouth daily.    Marland Kitchen doxepin (SINEQUAN) 25 MG capsule TAKE 1 CAPSULE (25 MG TOTAL) BY MOUTH AT BEDTIME.    . montelukast (SINGULAIR) 10 MG tablet TAKE 1 TABLET BY MOUTH EVERYDAY AT BEDTIME 90 tablet 0  . nitrofurantoin, macrocrystal-monohydrate, (MACROBID) 100 MG capsule Take 1 capsule (100 mg total) by mouth 2 (two) times daily. 14 capsule 0  . cyanocobalamin ((VITAMIN B-12)) injection 1,000 mcg      No facility-administered medications prior to visit.    ROS Review of Systems  Constitutional: Negative.  Negative for fatigue and fever.  HENT: Negative.   Respiratory: Negative for cough, chest tightness and shortness of breath.   Cardiovascular: Negative for chest pain, palpitations and leg swelling.  Gastrointestinal: Negative for abdominal pain, constipation, diarrhea, nausea and vomiting.  Endocrine: Negative.   Genitourinary: Positive for dysuria and hematuria. Negative for decreased urine volume, difficulty urinating and urgency.  Musculoskeletal: Negative.  Negative for arthralgias and myalgias.  Skin: Negative.  Negative for color change.  Neurological: Negative.  Negative for dizziness and weakness.  Hematological: Negative for adenopathy. Does not bruise/bleed easily.  Psychiatric/Behavioral:  Negative.     Objective:  BP 138/88 (BP Location: Right Arm, Patient Position: Sitting, Cuff Size: Large)   Pulse 77   Temp 98.4 F (36.9 C) (Oral)   Resp 16   Ht 5' 3.5" (1.613 m)   Wt 222 lb (100.7 kg)   LMP 04/26/2021 Comment: BTL  SpO2 99%   BMI 38.71 kg/m   BP Readings from Last 3 Encounters:  05/13/21 138/88  12/08/20 130/78  11/17/20 130/88    Wt Readings from Last 3 Encounters:  05/13/21 222 lb (100.7 kg)   12/08/20 218 lb (98.9 kg)  11/17/20 219 lb (99.3 kg)    Physical Exam Vitals reviewed.  Constitutional:      Appearance: Normal appearance.  HENT:     Nose: Nose normal.     Mouth/Throat:     Mouth: Mucous membranes are moist.  Eyes:     General: No scleral icterus.    Conjunctiva/sclera: Conjunctivae normal.  Cardiovascular:     Rate and Rhythm: Normal rate and regular rhythm.     Heart sounds: No murmur heard.   Pulmonary:     Effort: Pulmonary effort is normal.     Breath sounds: No stridor. No wheezing, rhonchi or rales.  Abdominal:     General: Abdomen is flat.     Palpations: There is no mass.     Tenderness: There is no abdominal tenderness. There is no guarding.  Musculoskeletal:        General: Normal range of motion.     Cervical back: Neck supple.     Right lower leg: No edema.     Left lower leg: No edema.  Lymphadenopathy:     Cervical: No cervical adenopathy.  Skin:    General: Skin is warm and dry.  Neurological:     General: No focal deficit present.     Mental Status: She is alert.  Psychiatric:        Mood and Affect: Mood normal.        Behavior: Behavior normal.     Lab Results  Component Value Date   WBC 7.7 05/13/2021   HGB 12.4 05/13/2021   HCT 37.1 05/13/2021   PLT 359.0 05/13/2021   GLUCOSE 114 (H) 05/13/2021   CHOL 201 (H) 05/13/2021   TRIG 105.0 05/13/2021   HDL 38.80 (L) 05/13/2021   LDLDIRECT 99.0 12/21/2016   LDLCALC 141 (H) 05/13/2021   ALT 17 01/11/2020   AST 16 01/11/2020   NA 135 05/13/2021   K 3.8 05/13/2021   CL 101 05/13/2021   CREATININE 0.82 05/13/2021   BUN 8 05/13/2021   CO2 26 05/13/2021   TSH 1.96 05/13/2021   HGBA1C 6.5 05/13/2021    MR Brain Wo Contrast  Result Date: 12/05/2020 CLINICAL DATA:  Headache with upper extremity tingling EXAM: MRI HEAD WITHOUT CONTRAST TECHNIQUE: Multiplanar, multiecho pulse sequences of the brain and surrounding structures were obtained without intravenous contrast.  COMPARISON:  None. FINDINGS: Brain: No acute infarct, mass effect or extra-axial collection. No acute or chronic hemorrhage. Normal white matter signal, parenchymal volume and CSF spaces. The midline structures are normal. Vascular: Major flow voids are preserved. Skull and upper cervical spine: Normal calvarium and skull base. Visualized upper cervical spine and soft tissues are normal. Sinuses/Orbits:No paranasal sinus fluid levels or advanced mucosal thickening. No mastoid or middle ear effusion. Normal orbits. IMPRESSION: Normal brain MRI. Electronically Signed   By: Ulyses Jarred M.D.   On: 12/05/2020 01:24   MR  Cervical Spine Wo Contrast  Result Date: 12/05/2020 CLINICAL DATA:  Chronic neck pain EXAM: MRI CERVICAL SPINE WITHOUT CONTRAST TECHNIQUE: Multiplanar, multisequence MR imaging of the cervical spine was performed. No intravenous contrast was administered. COMPARISON:  None. FINDINGS: Alignment: Physiologic. Vertebrae: No fracture, evidence of discitis, or bone lesion. Cord: Normal signal and morphology. Posterior Fossa, vertebral arteries, paraspinal tissues: Negative. Disc levels: C1-2: Unremarkable. C2-3: Normal disc space and facet joints. There is no spinal canal stenosis. No neural foraminal stenosis. C3-4: Normal disc space and facet joints. There is no spinal canal stenosis. No neural foraminal stenosis. C4-5: Normal disc space and facet joints. There is no spinal canal stenosis. No neural foraminal stenosis. C5-6: Disc bulge with left-greater-than-right uncovertebral hypertrophy. There is no spinal canal stenosis. Moderate left neural foraminal stenosis. C6-7: Small disc bulge with uncovertebral hypertrophy. There is no spinal canal stenosis. Moderate right neural foraminal stenosis. C7-T1: Normal disc space and facet joints. There is no spinal canal stenosis. No neural foraminal stenosis. IMPRESSION: 1. Moderate left C5-6 and right C6-7 neural foraminal stenosis secondary to uncovertebral  hypertrophy. 2. No spinal canal stenosis. Electronically Signed   By: Ulyses Jarred M.D.   On: 12/05/2020 01:22    Assessment & Plan:   Lindia was seen today for annual exam, urinary tract infection, hyperlipidemia and hypertension.  Diagnoses and all orders for this visit:  Essential hypertension, benign- Her blood pressure is adequately well controlled. -     Basic metabolic panel; Future -     Thyroid Panel With TSH; Future -     Thyroid Panel With TSH -     Basic metabolic panel  A54 deficiency-  Will continue parenteral B12 replacement therapy. -     CBC with Differential/Platelet; Future -     CBC with Differential/Platelet  Hyperlipidemia LDL goal <130 - I recommended that she take a statin for cardiovascular risk reduction. -     Lipid panel; Future -     Thyroid Panel With TSH; Future -     Thyroid Panel With TSH -     Lipid panel -     rosuvastatin (CRESTOR) 10 MG tablet; Take 1 tablet (10 mg total) by mouth daily.  Prediabetes- Her A1c is up to 6.5%. -     Basic metabolic panel; Future -     Hemoglobin A1c; Future -     Hemoglobin A1c -     Basic metabolic panel  Routine general medical examination at a health care facility- Exam completed, labs reviewed, vaccines reviewed and updated, cancer screenings addressed, patient education was given.  Visit for screening mammogram -     MM DIGITAL SCREENING BILATERAL; Future  Need for hepatitis C screening test -     Hepatitis C antibody; Future -     Hepatitis C antibody  UTI symptoms -     POCT Urinalysis Dipstick -     CULTURE, URINE COMPREHENSIVE; Future -     CULTURE, URINE COMPREHENSIVE  Pyuria -     CULTURE, URINE COMPREHENSIVE; Future -     CULTURE, URINE COMPREHENSIVE  Acute cystitis with hematuria- Urine culture is positive for E. coli.  It is sensitive to nitrofurantoin. -     nitrofurantoin, macrocrystal-monohydrate, (MACROBID) 100 MG capsule; Take 1 capsule (100 mg total) by mouth 2 (two) times daily  for 5 days.  Cervical cancer screening -     Ambulatory referral to Gynecology  Type II diabetes mellitus with manifestations ([place])- Her A1c is 6.5%.  Medical therapy is not indicated. -     HM Diabetes Foot Exam -     Ambulatory referral to Ophthalmology   I have discontinued Cathlean Cower D. Decesare's Cholecalciferol (DIALYVITE VITAMIN D 5000 PO), montelukast, and doxepin. I have also changed her nitrofurantoin (macrocrystal-monohydrate). Additionally, I am having her start on rosuvastatin. Lastly, I am having her maintain her BIOTIN PO, Cyanocobalamin (VITAMIN B-12 IJ), ferrous sulfate, potassium chloride, clobetasol, b complex vitamins, levocetirizine, Bystolic, esomeprazole, NONFORMULARY OR COMPOUNDED ITEM, spironolactone, ergocalciferol, hydrochlorothiazide, Efinaconazole, and naproxen. We will stop administering cyanocobalamin.  Meds ordered this encounter  Medications  . nitrofurantoin, macrocrystal-monohydrate, (MACROBID) 100 MG capsule    Sig: Take 1 capsule (100 mg total) by mouth 2 (two) times daily for 5 days.    Dispense:  10 capsule    Refill:  0  . rosuvastatin (CRESTOR) 10 MG tablet    Sig: Take 1 tablet (10 mg total) by mouth daily.    Dispense:  90 tablet    Refill:  1     Follow-up: Return in about 3 months (around 08/13/2021).  Scarlette Calico, MD

## 2021-05-13 NOTE — Telephone Encounter (Signed)
Patient would like a referral to a new OBGYN preferably in the [facility] network

## 2021-05-13 NOTE — Patient Instructions (Signed)
Health Maintenance, Female Adopting a healthy lifestyle and getting preventive care are important in promoting health and wellness. Ask your health care provider about:  The right schedule for you to have regular tests and exams.  Things you can do on your own to prevent diseases and keep yourself healthy. What should I know about diet, weight, and exercise? Eat a healthy diet  Eat a diet that includes plenty of vegetables, fruits, low-fat dairy products, and lean protein.  Do not eat a lot of foods that are high in solid fats, added sugars, or sodium.   Maintain a healthy weight Body mass index (BMI) is used to identify weight problems. It estimates body fat based on height and weight. Your health care provider can help determine your BMI and help you achieve or maintain a healthy weight. Get regular exercise Get regular exercise. This is one of the most important things you can do for your health. Most adults should:  Exercise for at least 150 minutes each week. The exercise should increase your heart rate and make you sweat (moderate-intensity exercise).  Do strengthening exercises at least twice a week. This is in addition to the moderate-intensity exercise.  Spend less time sitting. Even light physical activity can be beneficial. Watch cholesterol and blood lipids Have your blood tested for lipids and cholesterol at 46 years of age, then have this test every 5 years. Have your cholesterol levels checked more often if:  Your lipid or cholesterol levels are high.  You are older than 46 years of age.  You are at high risk for heart disease. What should I know about cancer screening? Depending on your health history and family history, you may need to have cancer screening at various ages. This may include screening for:  Breast cancer.  Cervical cancer.  Colorectal cancer.  Skin cancer.  Lung cancer. What should I know about heart disease, diabetes, and high blood  pressure? Blood pressure and heart disease  High blood pressure causes heart disease and increases the risk of stroke. This is more likely to develop in people who have high blood pressure readings, are of African descent, or are overweight.  Have your blood pressure checked: ? Every 3-5 years if you are 18-39 years of age. ? Every year if you are 40 years old or older. Diabetes Have regular diabetes screenings. This checks your fasting blood sugar level. Have the screening done:  Once every three years after age 40 if you are at a normal weight and have a low risk for diabetes.  More often and at a younger age if you are overweight or have a high risk for diabetes. What should I know about preventing infection? Hepatitis B If you have a higher risk for hepatitis B, you should be screened for this virus. Talk with your health care provider to find out if you are at risk for hepatitis B infection. Hepatitis C Testing is recommended for:  Everyone born from 1945 through 1965.  Anyone with known risk factors for hepatitis C. Sexually transmitted infections (STIs)  Get screened for STIs, including gonorrhea and chlamydia, if: ? You are sexually active and are younger than 46 years of age. ? You are older than 46 years of age and your health care provider tells you that you are at risk for this type of infection. ? Your sexual activity has changed since you were last screened, and you are at increased risk for chlamydia or gonorrhea. Ask your health care provider   if you are at risk.  Ask your health care provider about whether you are at high risk for HIV. Your health care provider may recommend a prescription medicine to help prevent HIV infection. If you choose to take medicine to prevent HIV, you should first get tested for HIV. You should then be tested every 3 months for as long as you are taking the medicine. Pregnancy  If you are about to stop having your period (premenopausal) and  you may become pregnant, seek counseling before you get pregnant.  Take 400 to 800 micrograms (mcg) of folic acid every day if you become pregnant.  Ask for birth control (contraception) if you want to prevent pregnancy. Osteoporosis and menopause Osteoporosis is a disease in which the bones lose minerals and strength with aging. This can result in bone fractures. If you are 65 years old or older, or if you are at risk for osteoporosis and fractures, ask your health care provider if you should:  Be screened for bone loss.  Take a calcium or vitamin D supplement to lower your risk of fractures.  Be given hormone replacement therapy (HRT) to treat symptoms of menopause. Follow these instructions at home: Lifestyle  Do not use any products that contain nicotine or tobacco, such as cigarettes, e-cigarettes, and chewing tobacco. If you need help quitting, ask your health care provider.  Do not use street drugs.  Do not share needles.  Ask your health care provider for help if you need support or information about quitting drugs. Alcohol use  Do not drink alcohol if: ? Your health care provider tells you not to drink. ? You are pregnant, may be pregnant, or are planning to become pregnant.  If you drink alcohol: ? Limit how much you use to 0-1 drink a day. ? Limit intake if you are breastfeeding.  Be aware of how much alcohol is in your drink. In the [place] one drink equals one 12 oz bottle of beer (355 mL), one 5 oz glass of wine (148 mL), or one 1 oz glass of hard liquor (44 mL). General instructions  Schedule regular health, dental, and eye exams.  Stay current with your vaccines.  Tell your health care provider if: ? You often feel depressed. ? You have ever been abused or do not feel safe at home. Summary  Adopting a healthy lifestyle and getting preventive care are important in promoting health and wellness.  Follow your health care provider's instructions about healthy  diet, exercising, and getting tested or screened for diseases.  Follow your health care provider's instructions on monitoring your cholesterol and blood pressure. This information is not intended to replace advice given to you by your health care provider. Make sure you discuss any questions you have with your health care provider. Document Revised: 11/29/2018 Document Reviewed: 11/29/2018 Elsevier Patient Education  2021 [employer] Inc[employer]  

## 2021-05-13 NOTE — Progress Notes (Signed)
Subjective: Samantha Ball is a 45 y.o.  female returns to office today for follow up evaluation after having right Hallux total nail avulsion performed. Patient had been soaking using epsom salts and applying topical antibiotic covered with bandaid daily.  She states procedure site is healed she denies any drainage.  Awaiting nail culture result.  Patient denies fevers, chills, nausea, vomiting. Denies any calf pain, chest pain, SOB.   Objective:  Vitals: Reviewed  General: Well developed, nourished, in no acute distress, alert and oriented x3   Dermatology: Skin is warm, dry and supple bilateral.  Right hallux nail bed appears to be clean, dry, with minimal granular tissue and surrounding scab. There is no surrounding erythema, edema, drainage/purulence. There are no other lesions or other signs of infection present.  Neurovascular status: Intact. No lower extremity swelling; No pain with calf compression bilateral.  Musculoskeletal: No significant tenderness to palpation of the right hallux nail bed. Muscular strength within normal limits bilateral.   Assesement and Plan: S/p partial nail avulsion, doing well.   -Continue soaking in epsom salts twice a day followed by antibiotic ointment and a band-aid. Can leave uncovered at night. Continue this until completely healed.  -If the area has not healed in 2 weeks, call the office for follow-up appointment, or sooner if any problems arise.  -Monitor for any signs/symptoms of infection. Call the office immediately if any occur or go directly to the emergency room. Call with any questions/concerns.  Awaiting pathology report.  Celesta Gentile, DPM

## 2021-05-14 LAB — HEPATITIS C ANTIBODY
Hepatitis C Ab: NONREACTIVE
SIGNAL TO CUT-OFF: 0.01 (ref <1.00)

## 2021-05-15 LAB — CULTURE, URINE COMPREHENSIVE

## 2021-05-17 MED ORDER — ROSUVASTATIN CALCIUM 10 MG PO TABS: 10.0000 mg | ORAL_TABLET | Freq: Every day | ORAL | 1 refills | Status: DC

## 2021-05-21 ENCOUNTER — Other Ambulatory Visit: Payer: Self-pay | Admitting: Internal Medicine

## 2021-05-21 DIAGNOSIS — E876 Hypokalemia: Secondary | ICD-10-CM

## 2021-05-21 DIAGNOSIS — I10 Essential (primary) hypertension: Secondary | ICD-10-CM

## 2021-05-26 ENCOUNTER — Encounter: Payer: Self-pay | Admitting: Internal Medicine

## 2021-06-01 ENCOUNTER — Ambulatory Visit (INDEPENDENT_AMBULATORY_CARE_PROVIDER_SITE_OTHER): Payer: Commercial Managed Care - PPO

## 2021-06-01 ENCOUNTER — Other Ambulatory Visit: Payer: Self-pay

## 2021-06-01 DIAGNOSIS — E538 Deficiency of other specified B group vitamins: Secondary | ICD-10-CM | POA: Diagnosis not present

## 2021-06-01 MED ORDER — CYANOCOBALAMIN 1000 MCG/ML IJ SOLN
1000.0000 ug | Freq: Once | INTRAMUSCULAR | Status: AC
  Administered 2021-06-01: 1000 ug via INTRAMUSCULAR

## 2021-06-01 NOTE — Progress Notes (Signed)
Pt here for monthly B12 injection per Dr. Jones  B12 1000mcg given IM, and pt tolerated injection well.  Next B12 injection scheduled for 06/02/21  

## 2021-06-04 ENCOUNTER — Other Ambulatory Visit: Payer: Self-pay

## 2021-06-04 ENCOUNTER — Encounter: Payer: Self-pay | Admitting: Internal Medicine

## 2021-06-04 ENCOUNTER — Ambulatory Visit (INDEPENDENT_AMBULATORY_CARE_PROVIDER_SITE_OTHER): Payer: Commercial Managed Care - PPO | Admitting: Internal Medicine

## 2021-06-04 VITALS — BP 138/88 | HR 68 | Temp 98.5°F | Ht 63.5 in | Wt 218.2 lb

## 2021-06-04 DIAGNOSIS — N390 Urinary tract infection, site not specified: Secondary | ICD-10-CM | POA: Diagnosis not present

## 2021-06-04 LAB — POCT URINALYSIS DIP (MANUAL ENTRY)
Bilirubin, UA: NEGATIVE
Blood, UA: NEGATIVE
Glucose, UA: NEGATIVE mg/dL
Ketones, POC UA: NEGATIVE mg/dL
Nitrite, UA: POSITIVE — AB
Protein Ur, POC: NEGATIVE mg/dL
Spec Grav, UA: 1.015 (ref 1.010–1.025)
Urobilinogen, UA: 0.2 E.U./dL
pH, UA: 6 (ref 5.0–8.0)

## 2021-06-04 MED ORDER — CEPHALEXIN 500 MG PO CAPS: 500.0000 mg | ORAL_CAPSULE | Freq: Two times a day (BID) | ORAL | 0 refills | Status: AC

## 2021-06-04 NOTE — Progress Notes (Signed)
   Subjective:   Patient ID: Samantha Ball, female    DOB: 12-28-1975, 45 y.o.   MRN: [id number]  HPI The patient is a 45 YO female coming in for urinary symptoms. Started Sunday with low back pain and frequency. She has had UTI in the past and this feels similar. Denies fevers or chills. Denies nausea or vomiting. Had UTI about 3 weeks ago and treated with macrobid at the time. Culture done and sensitive to this.   Review of Systems  Constitutional: Negative.   Respiratory: Negative.    Cardiovascular: Negative.   Gastrointestinal:  Negative for abdominal distention, abdominal pain, constipation, diarrhea, nausea and vomiting.  Genitourinary:  Positive for frequency and urgency. Negative for dysuria.  Musculoskeletal:  Positive for back pain.  Skin: Negative.    Objective:  Physical Exam Constitutional:      Appearance: She is well-developed.  HENT:     Head: Normocephalic and atraumatic.  Cardiovascular:     Rate and Rhythm: Normal rate and regular rhythm.  Pulmonary:     Effort: Pulmonary effort is normal. No respiratory distress.     Breath sounds: Normal breath sounds. No wheezing or rales.  Abdominal:     General: Bowel sounds are normal. There is no distension.     Palpations: Abdomen is soft.     Tenderness: There is no abdominal tenderness. There is no rebound.  Musculoskeletal:        General: Tenderness present.     Cervical back: Normal range of motion.     Comments: Low back tenderness, no true flank tenderness  Skin:    General: Skin is warm and dry.  Neurological:     Mental Status: She is alert and oriented to person, place, and time.     Coordination: Coordination normal.    Vitals:   06/04/21 1017  BP: 138/88  Pulse: 68  Temp: 98.5 F (36.9 C)  TempSrc: Oral  SpO2: 99%  Weight: 218 lb 3.2 oz (99 kg)  Height: 5' 3.5" (1.613 m)    This visit occurred during the SARS-CoV-2 public health emergency.  Safety protocols were in place, including  screening questions prior to the visit, additional usage of staff PPE, and extensive cleaning of exam room while observing appropriate contact time as indicated for disinfecting solutions.   Assessment & Plan:

## 2021-06-04 NOTE — Patient Instructions (Signed)
We have sent in the keflex to take 1 pill twice a day for 5 days. 

## 2021-06-04 NOTE — Assessment & Plan Note (Signed)
POC U/A done in office consistent with infection. This is recurrent in less than a month so urine culture ordered. Rx keflex 5 days course. She is not pregnant.

## 2021-06-05 IMAGING — US US PELVIS COMPLETE WITH TRANSVAGINAL
1 series · 13 of 25 positions shown · non-contrast
Comparison: None

CLINICAL DATA: Pelvic pain for 1 month, heavy menses at times

EXAM:
TRANSABDOMINAL AND TRANSVAGINAL ULTRASOUND OF PELVIS
TECHNIQUE: Both transabdominal and transvaginal ultrasound examinations of the
pelvis were performed. Transabdominal technique was performed for
global imaging of the pelvis including uterus, ovaries, adnexal
regions, and pelvic cul-de-sac. It was necessary to proceed with
endovaginal exam following the transabdominal exam to visualize the
ovaries, endometrium and adnexa.

[Series 1: us pelvis complete with transvaginal · 0.26mm/px · 13 of 55 slices shown]
[im 1/55]
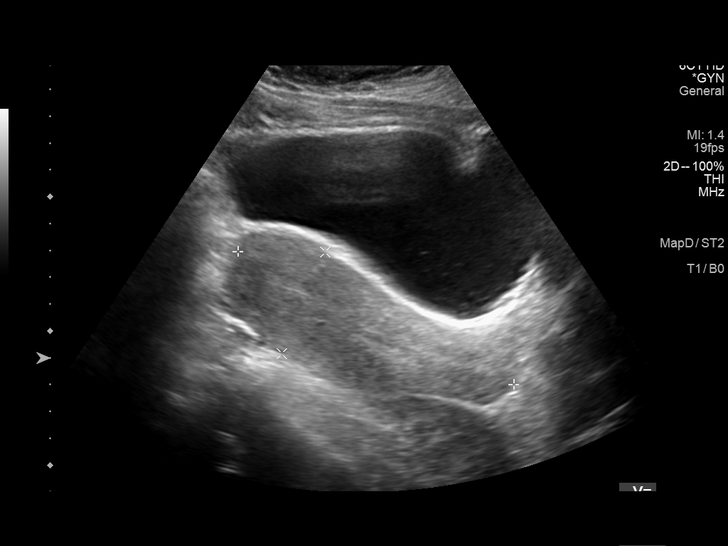
[im 5/55]
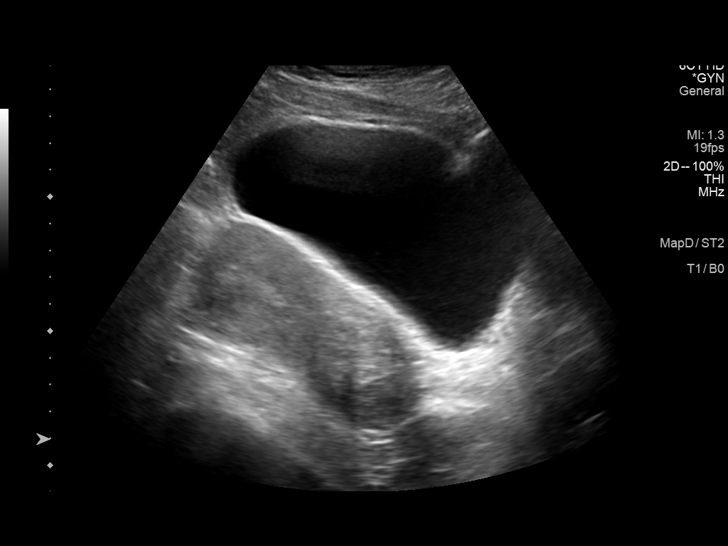
[im 10/55]
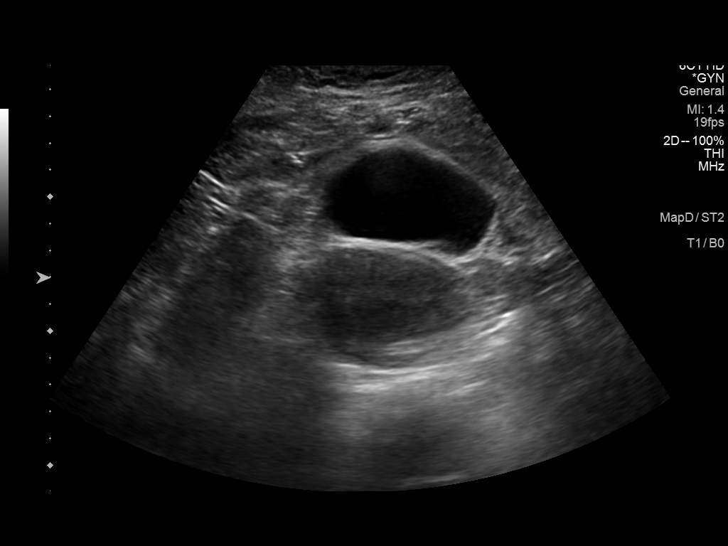
[im 14/55]
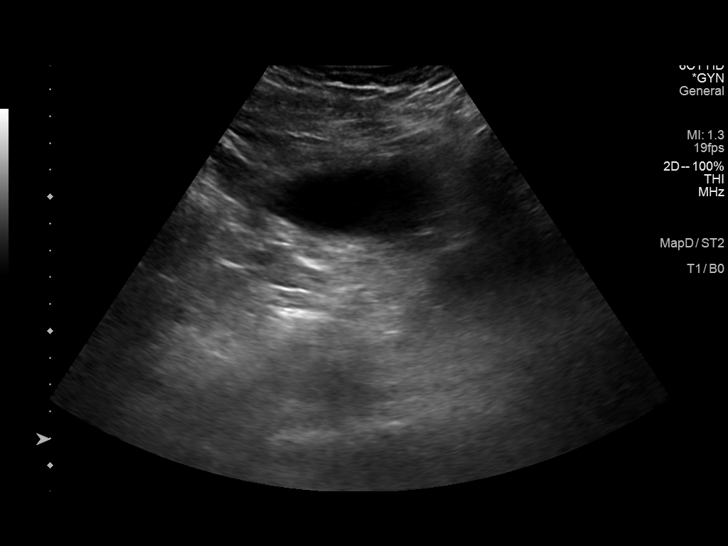
[im 19/55]
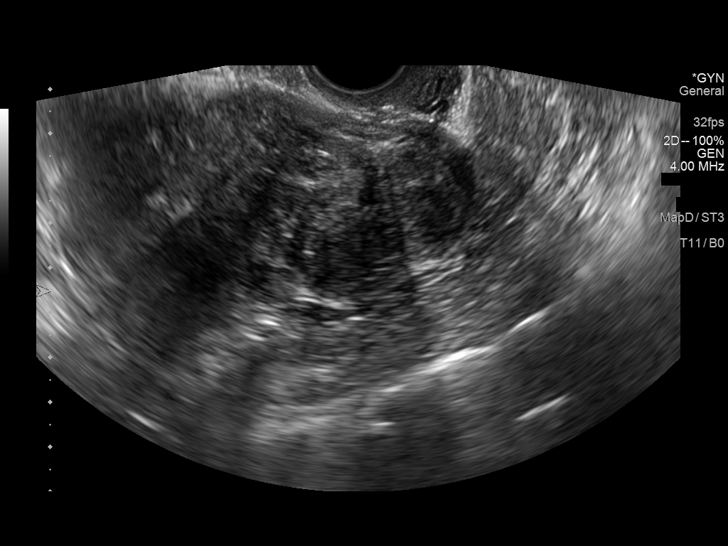
[im 23/55]
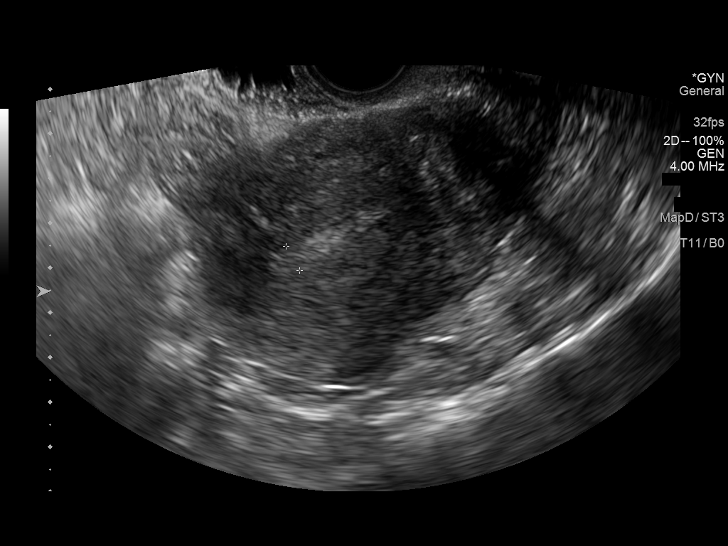
[im 28/55]
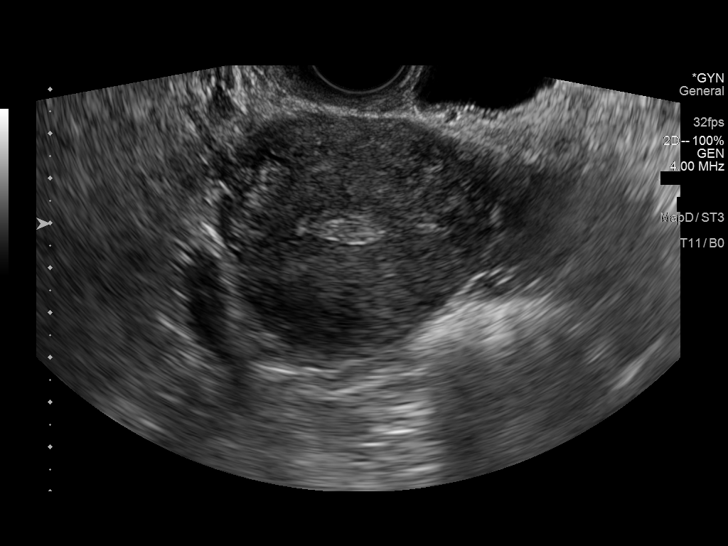
[im 32/55]
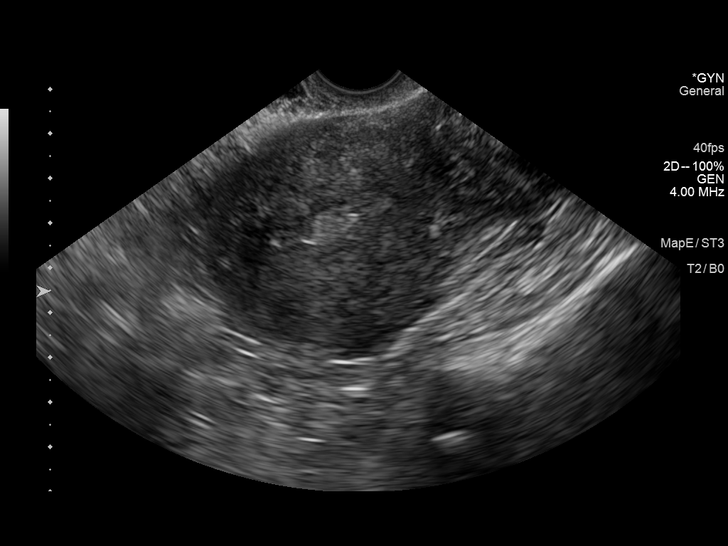
[im 37/55]
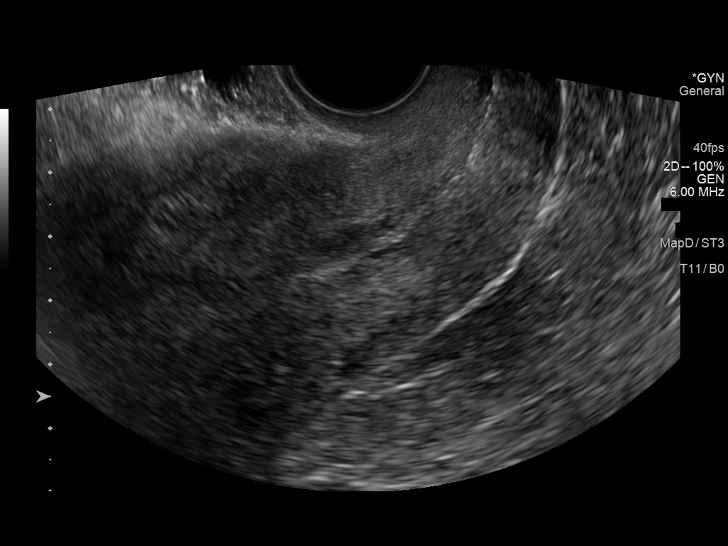
[im 41/55]
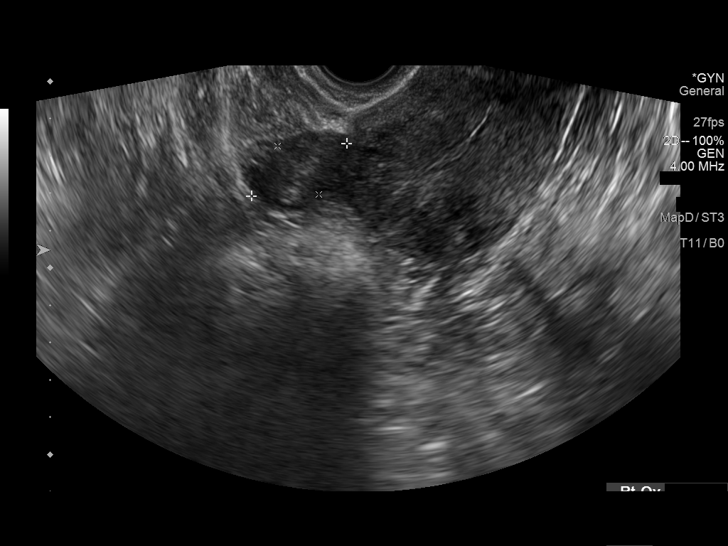
[im 46/55]
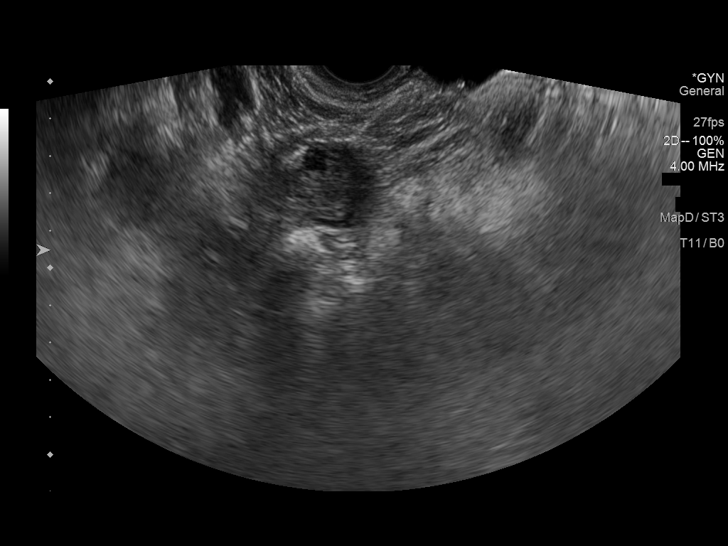
[im 50/55]
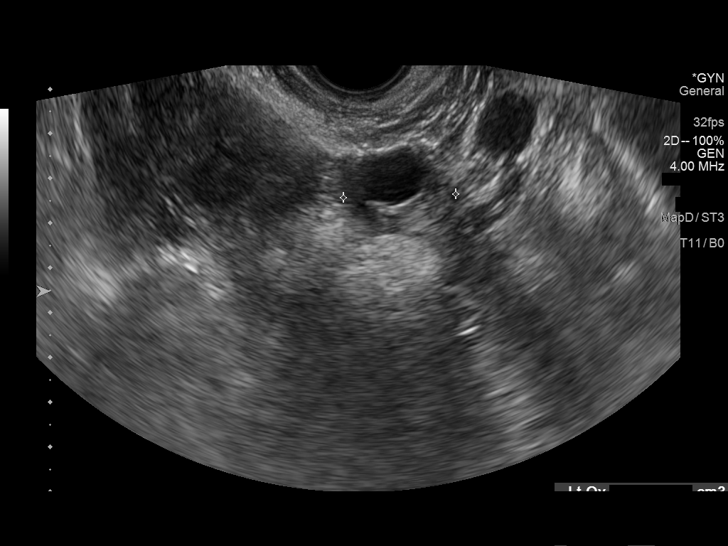
[im 55/55]
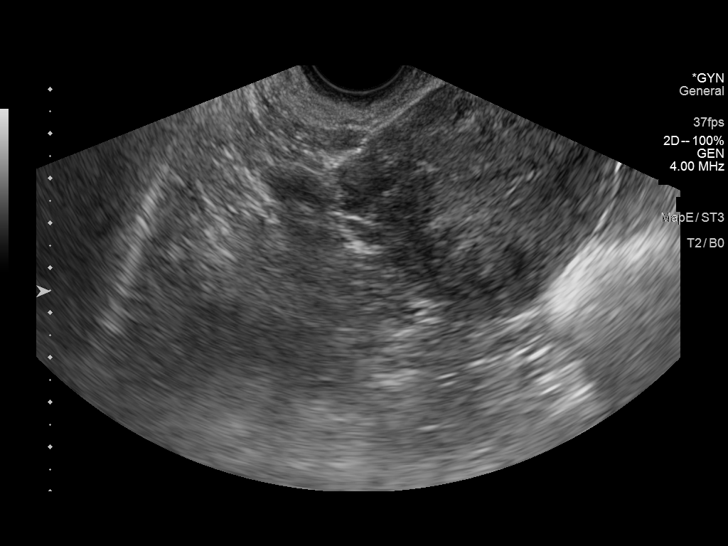

[13 of 25 positions shown; findings below may reference images not displayed]

FINDINGS: Uterus

Measurements: 11.4 x 4.1 x 6.0 cm = volume: 148 mL. Anteverted.
Large leiomyoma posterior LEFT uterus 4.7 x 3.5 x 4.5 cm,
subserosal. No definite endometrial extension. No additional masses.

Endometrium

Thickness: 6 mm. Few nonspecific echogenic foci within endometrial
complex with question of a focal hyperechoic nodule/polyp at upper
uterine segment 13 x 6 x 13 mm. No endometrial fluid.

Right ovary

Measurements: 2.9 x 1.7 x 2.1 cm = volume: 5.4 mL. Normal morphology
without mass

Left ovary

Measurements: 2.3 x 1.9 x 2.5 cm = volume: 5.8 mL. Normal morphology
without mass

Other findings

No free pelvic fluid.  No adnexal masses.
IMPRESSION: LEFT-sided uterine leiomyoma 4.7 cm diameter.

Questionable endometrial polyp 13 x 6 x 13 mm; consider further
evaluation with sonohysterogram for confirmation prior to
hysteroscopy. Endometrial sampling should also be considered if
patient is at high risk for endometrial carcinoma. (Ref:
Radiological Reasoning: Algorithmic Workup of Abnormal Vaginal
Bleeding with Endovaginal Sonography and Sonohysterography. AJR
[id number]; 191:[id number])

These results will be called to the ordering clinician or
representative by the Radiologist Assistant, and communication
documented in the PACS or zVision Dashboard.

## 2021-06-06 LAB — URINE CULTURE

## 2021-06-17 ENCOUNTER — Encounter: Payer: Self-pay | Admitting: Internal Medicine

## 2021-06-17 NOTE — Progress Notes (Addendum)
Office Visit Note  Patient: Samantha Ball             Date of Birth: Mar 21, 1976           MRN: [id number]             PCP: [name], MD Referring: [name], MD Visit Date: 06/26/2021 Occupation: @GUAROCC @  Subjective:  Fatigue and hair loss   History of Present Illness: YANNA LEAKS is a 45 y.o. female with a history of osteoarthritis and positive ANA.  She states she started losing hair last year and she still have severe loss of hair on the crown of her head.  She has been using some topical agents advised by the dermatologist.  She denies any history of oral ulcers, nasal ulcers, malar rash, photosensitivity, lymphadenopathy or inflammatory arthritis.  She has some stiffness in her hands.  She does not have any discomfort in her knee joints.  Activities of Daily Living:  Patient reports morning stiffness for several hours.   Patient Denies nocturnal pain.  Difficulty dressing/grooming: Denies Difficulty climbing stairs: Reports Difficulty getting out of chair: Denies Difficulty using hands for taps, buttons, cutlery, and/or writing: Reports  Review of Systems  Constitutional:  Positive for fatigue.  HENT:  Negative for mouth sores, mouth dryness and nose dryness.   Eyes:  Positive for dryness. Negative for pain, itching and visual disturbance.  Respiratory:  Negative for cough, hemoptysis, shortness of breath and difficulty breathing.   Cardiovascular:  Negative for chest pain, palpitations and swelling in legs/feet.  Gastrointestinal:  Positive for constipation. Negative for abdominal pain, blood in stool and diarrhea.  Endocrine: Negative for increased urination.  Genitourinary:  Negative for painful urination.  Musculoskeletal:  Positive for joint pain, joint pain, myalgias, morning stiffness and myalgias. Negative for joint swelling, muscle weakness and muscle tenderness.  Skin:  Negative for color change, rash, redness and sensitivity to sunlight.   Allergic/Immunologic: Negative for susceptible to infections.  Neurological:  Positive for numbness, headaches and parasthesias. Negative for dizziness, memory loss and weakness.  Hematological:  Negative for swollen glands.  Psychiatric/Behavioral:  Positive for sleep disturbance. Negative for depressed mood and confusion. The patient is not nervous/anxious.    PMFS History:  Patient Active Problem List   Diagnosis Date Noted   Acute cystitis without hematuria 06/18/2021   Frequent UTI 06/18/2021   Need for hepatitis C screening test 05/13/2021   Pyuria 05/13/2021   UTI symptoms 05/13/2021   Acute cystitis with hematuria 05/13/2021   Cervical cancer screening 05/13/2021   Type II diabetes mellitus with manifestations ([place]) 05/13/2021   Onychomycosis 04/27/2021   Sensorineural hearing loss 04/27/2021   Migraine without aura and without status migrainosus, not intractable 11/17/2020   Routine general medical examination at a health care facility 04/01/2020   Intrinsic eczema 04/01/2020   Vitamin D deficiency disease 11/21/2018   B12 deficiency 11/21/2018   Urinary tract infection without hematuria 11/21/2018   Perennial allergic rhinitis 03/06/2018   Numbness and tingling of both upper extremities while sleeping 01/18/2018   Prediabetes 01/18/2018   Seasonal allergic rhinitis due to pollen 05/09/2017   Hyperlipidemia LDL goal <130 12/21/2016   Snoring 11/09/2016   Other iron deficiency anemias 12/17/2014   GERD (gastroesophageal reflux disease) 01/09/2014   Constipation 01/09/2014   Diuretic-induced hypokalemia 01/09/2014   Positive ANA (antinuclear antibody) 07/16/2013   Essential hypertension, benign 07/11/2013   Visit for screening mammogram 07/11/2013   Obesity (BMI 30.0-34.9) 07/11/2013  Past Medical History:  Diagnosis Date   Anemia    Fibroids    Dr. Charlesetta Garibaldi, per patient.    Hyperlipidemia    Hypertension    Intrinsic eczema 04/01/2020   Obesity    Sleep apnea     Urticaria     Family History  Problem Relation Age of Onset   Heart disease Father    Diabetes Maternal Aunt    Breast cancer Maternal Grandmother    Allergic rhinitis Daughter    Eczema Daughter    Alcohol abuse Neg Hx    COPD Neg Hx    Depression Neg Hx    Drug abuse Neg Hx    Early death Neg Hx    Hearing loss Neg Hx    Hyperlipidemia Neg Hx    Hypertension Neg Hx    Kidney disease Neg Hx    Stroke Neg Hx    Colon cancer Neg Hx    Rectal cancer Neg Hx    Stomach cancer Neg Hx    Past Surgical History:  Procedure Laterality Date   TUBAL LIGATION     wisdom teeth  2000   Social History   Social History Narrative   Not on file   Immunization History  Administered Date(s) Administered   Influenza,inj,Quad PF,6+ Mos 02/04/2016, 11/09/2016, 01/18/2018, 11/20/2018, 09/12/2019, 11/17/2020   Influenza-Unspecified 01/09/2014   PFIZER(Purple Top)SARS-COV-2 Vaccination 03/20/2020, 04/17/2020, 01/29/2021   Tdap 04/25/2013     Objective: Vital Signs: BP 121/79 (BP Location: Left Arm, Patient Position: Sitting, Cuff Size: Normal)   Pulse 66   Ht 5\' 3"  (1.6 m)   Wt 221 lb 9.6 oz (100.5 kg)   BMI 39.25 kg/m    Physical Exam Vitals and nursing note reviewed.  Constitutional:      Appearance: She is well-developed.  HENT:     Head: Normocephalic and atraumatic.  Eyes:     Conjunctiva/sclera: Conjunctivae normal.  Cardiovascular:     Rate and Rhythm: Normal rate and regular rhythm.     Heart sounds: Normal heart sounds.  Pulmonary:     Effort: Pulmonary effort is normal.     Breath sounds: Normal breath sounds.  Abdominal:     General: Bowel sounds are normal.     Palpations: Abdomen is soft.  Musculoskeletal:     Cervical back: Normal range of motion.  Lymphadenopathy:     Cervical: No cervical adenopathy.  Skin:    General: Skin is warm and dry.     Capillary Refill: Capillary refill takes less than 2 seconds.  Neurological:     Mental Status: She is  alert and oriented to person, place, and time.  Psychiatric:        Behavior: Behavior normal.     Musculoskeletal Exam: C-spine, thoracic and lumbar spine with good range of motion.  Shoulder joints, elbows, wrist joints, MCPs, PIPs and DIPs with good range of motion.  She has some prominence of DIP joints with no synovitis.  Hip joints and knee joints with good range of motion with no warmth swelling or effusion.  She had no tenderness over ankles or MTPs.  CDAI Exam: CDAI Score: -- Patient Global: --; Provider Global: -- Swollen: --; Tender: -- Joint Exam 06/26/2021   No joint exam has been documented for this visit   There is currently no information documented on the homunculus. Go to the Rheumatology activity and complete the homunculus joint exam.  Investigation: No additional findings.  Imaging: No results found.  Recent  Labs: Lab Results  Component Value Date   WBC 7.7 05/13/2021   HGB 12.4 05/13/2021   PLT 359.0 05/13/2021   NA 135 05/13/2021   K 3.8 05/13/2021   CL 101 05/13/2021   CO2 26 05/13/2021   GLUCOSE 114 (H) 05/13/2021   BUN 8 05/13/2021   CREATININE 0.82 05/13/2021   BILITOT 0.5 01/11/2020   ALKPHOS 50 01/11/2020   AST 16 01/11/2020   ALT 17 01/11/2020   PROT 7.3 01/11/2020   ALBUMIN 4.2 01/11/2020   CALCIUM 10.2 05/13/2021   GFRAA 105 03/06/2018   June 18, 2021 UA showed trace leukocytes, CBC was normal, Speciality Comments: No specialty comments available.  Procedures:  No procedures performed Allergies: Amlodipine   Assessment / Plan:     Visit Diagnoses: Positive ANA (antinuclear antibody) - AVISE: ANA 1: 320 speckled, ENA negative, CB CAP negative, antiphospholipid negative, beta-2 GP 1-, thyroid antibodies negative.  -She has been experiencing some fatigue and hair loss.  She denies any history of oral ulcers, nasal ulcers, malar rash, photosensitivity, Raynaud's phenomenon or lymphadenopathy.  She has history of dry eyes.  I will obtain  labs today.  Plan: COMPLETE METABOLIC PANEL WITH GFR, ANA, Anti-DNA antibody, double-stranded, C3 and C4.  We will contact her once the lab results are available.  I also advised her to contact me in case she develops any new symptoms.  Otherwise she will come back in 1 year for a follow-up visit.  Primary osteoarthritis of both hands-she has osteoarthritis in her hands with some stiffness.  She has mostly DIP thickening but no synovitis was noted  Primary osteoarthritis of both knees-she gives history of stiffness and discomfort in her knee joints off and on.  She did not have any warmth or swelling on examination today.  Trochanteric bursitis of left hip-improved.  Other insomnia-good sleep hygiene was discussed.  Vitamin D deficiency -she has history of vitamin D deficiency and she has been experiencing increased fatigue.  I advised her to take vitamin D 2000 units daily.  We will also check vitamin D level today.  Plan: VITAMIN D 25 Hydroxy (Vit-D Deficiency, Fractures)  Hyperlipidemia LDL goal <130  Essential hypertension, benign-blood pressure is well controlled.  B12 deficiency  Prediabetes  Orders: Orders Placed This Encounter  Procedures   COMPLETE METABOLIC PANEL WITH GFR   ANA   Anti-DNA antibody, double-stranded   C3 and C4   VITAMIN D 25 Hydroxy (Vit-D Deficiency, Fractures)    No orders of the defined types were placed in this encounter.   Follow-Up Instructions: Return in about 1 year (around 06/26/2022) for Osteoarthritis, +ANA.   Bo Merino, MD  Note - This record has been created using Editor, commissioning.  Chart creation errors have been sought, but may not always  have been located. Such creation errors do not reflect on  the standard of medical care.

## 2021-06-18 ENCOUNTER — Ambulatory Visit (INDEPENDENT_AMBULATORY_CARE_PROVIDER_SITE_OTHER): Payer: Commercial Managed Care - PPO | Admitting: Internal Medicine

## 2021-06-18 ENCOUNTER — Encounter: Payer: Self-pay | Admitting: Internal Medicine

## 2021-06-18 ENCOUNTER — Other Ambulatory Visit: Payer: Self-pay

## 2021-06-18 VITALS — BP 128/82 | HR 68 | Temp 98.0°F | Ht 63.5 in | Wt 218.0 lb

## 2021-06-18 DIAGNOSIS — R399 Unspecified symptoms and signs involving the genitourinary system: Secondary | ICD-10-CM

## 2021-06-18 DIAGNOSIS — N3000 Acute cystitis without hematuria: Secondary | ICD-10-CM

## 2021-06-18 DIAGNOSIS — N390 Urinary tract infection, site not specified: Secondary | ICD-10-CM

## 2021-06-18 LAB — POCT URINALYSIS DIPSTICK
Bilirubin, UA: NEGATIVE
Blood, UA: NEGATIVE
Glucose, UA: NEGATIVE
Ketones, UA: NEGATIVE
Nitrite, UA: POSITIVE
Protein, UA: NEGATIVE
Spec Grav, UA: 1.02 (ref 1.010–1.025)
Urobilinogen, UA: 0.2 E.U./dL
pH, UA: 6 (ref 5.0–8.0)

## 2021-06-18 MED ORDER — METHENAMINE HIPPURATE 1 G PO TABS: 1.0000 g | ORAL_TABLET | Freq: Two times a day (BID) | ORAL | 1 refills | Status: DC

## 2021-06-18 MED ORDER — NITROFURANTOIN MONOHYD MACRO 100 MG PO CAPS: 100.0000 mg | ORAL_CAPSULE | Freq: Two times a day (BID) | ORAL | 0 refills | Status: AC

## 2021-06-18 NOTE — Patient Instructions (Signed)
Urinary Tract Infection, Adult A urinary tract infection (UTI) is an infection of any part of the urinary tract. The urinary tract includes the kidneys, ureters, bladder, and urethra. These organs make, store, and get rid of urine in the body. An upper UTI affects the ureters and kidneys. A lower UTI affects the bladder and urethra. What are the causes? Most urinary tract infections are caused by bacteria in your genital area around your urethra, where urine leaves your body. These bacteria grow and cause inflammation of your urinary tract. What increases the risk? You are more likely to develop this condition if: You have a urinary catheter that stays in place. You are not able to control when you urinate or have a bowel movement (incontinence). You are female and you: Use a spermicide or diaphragm for birth control. Have low estrogen levels. Are pregnant. You have certain genes that increase your risk. You are sexually active. You take antibiotic medicines. You have a condition that causes your flow of urine to slow down, such as: An enlarged prostate, if you are female. Blockage in your urethra. A kidney stone. A nerve condition that affects your bladder control (neurogenic bladder). Not getting enough to drink, or not urinating often. You have certain medical conditions, such as: Diabetes. A weak disease-fighting system (immunesystem). Sickle cell disease. Gout. Spinal cord injury. What are the signs or symptoms? Symptoms of this condition include: Needing to urinate right away (urgency). Frequent urination. This may include small amounts of urine each time you urinate. Pain or burning with urination. Blood in the urine. Urine that smells bad or unusual. Trouble urinating. Cloudy urine. Vaginal discharge, if you are female. Pain in the abdomen or the lower back. You may also have: Vomiting or a decreased appetite. Confusion. Irritability or tiredness. A fever or  chills. Diarrhea. The first symptom in older adults may be confusion. In some cases, they may not have any symptoms until the infection has worsened. How is this diagnosed? This condition is diagnosed based on your medical history and a physical exam. You may also have other tests, including: Urine tests. Blood tests. Tests for STIs (sexually transmitted infections). If you have had more than one UTI, a cystoscopy or imaging studies may be done to determine the cause of the infections. How is this treated? Treatment for this condition includes: Antibiotic medicine. Over-the-counter medicines to treat discomfort. Drinking enough water to stay hydrated. If you have frequent infections or have other conditions such as a kidney stone, you may need to see a health care provider who specializes in the urinary tract (urologist). In rare cases, urinary tract infections can cause sepsis. Sepsis is a life-threatening condition that occurs when the body responds to an infection. Sepsis is treated in the hospital with IV antibiotics, fluids, and other medicines. Follow these instructions at home: Medicines Take over-the-counter and prescription medicines only as told by your health care provider. If you were prescribed an antibiotic medicine, take it as told by your health care provider. Do not stop using the antibiotic even if you start to feel better. General instructions Make sure you: Empty your bladder often and completely. Do not hold urine for long periods of time. Empty your bladder after sex. Wipe from front to back after urinating or having a bowel movement if you are female. Use each tissue only one time when you wipe. Drink enough fluid to keep your urine pale yellow. Keep all follow-up visits. This is important. Contact a health care provider   if: Your symptoms do not get better after 1-2 days. Your symptoms go away and then return. Get help right away if: You have severe pain in your  back or your lower abdomen. You have a fever or chills. You have nausea or vomiting. Summary A urinary tract infection (UTI) is an infection of any part of the urinary tract, which includes the kidneys, ureters, bladder, and urethra. Most urinary tract infections are caused by bacteria in your genital area. Treatment for this condition often includes antibiotic medicines. If you were prescribed an antibiotic medicine, take it as told by your health care provider. Do not stop using the antibiotic even if you start to feel better. Keep all follow-up visits. This is important. This information is not intended to replace advice given to you by your health care provider. Make sure you discuss any questions you have with your health care provider. Document Revised: 07/18/2020 Document Reviewed: 07/18/2020 Elsevier Patient Education  2022 [employer]  

## 2021-06-18 NOTE — Progress Notes (Signed)
Subjective:  Patient ID: Samantha Ball, female    DOB: May 01, 1976  Age: 45 y.o. MRN: [id number]  CC: Urinary Tract Infection  This visit occurred during the SARS-CoV-2 public health emergency.  Safety protocols were in place, including screening questions prior to the visit, additional usage of staff PPE, and extensive cleaning of exam room while observing appropriate contact time as indicated for disinfecting solutions.    HPI DERINDA BARTUS presents for f/up   She complains of a 1 week history of frequency and hematuria.  She tells me she has had 3 UTIs in the last year.  She has had some low back pain but she denies nausea, vomiting, fever, chills, abdominal pain, or flank pain.  Outpatient Medications Prior to Visit  Medication Sig Dispense Refill   b complex vitamins capsule Take 1 capsule by mouth daily.     BIOTIN PO Take by mouth daily.     clobetasol (TEMOVATE) 0.05 % external solution Apply topically 2 (two) times daily. 50 mL 3   Cyanocobalamin (VITAMIN B-12 IJ) Inject as directed every 30 (thirty) days.     Efinaconazole 10 % SOLN Apply 1 drop topically daily. 4 mL 11   esomeprazole (NEXIUM) 40 MG capsule TAKE 1 CAPSULE BY MOUTH EVERY DAY 90 capsule 1   ferrous sulfate 325 (65 FE) MG tablet TAKE 1 TABLET BY MOUTH 3 TIMES DAILY WITH MEALS. 270 tablet 1   hydrochlorothiazide (HYDRODIURIL) 25 MG tablet Take 0.5 tablets by mouth daily.     levocetirizine (XYZAL) 5 MG tablet TAKE 1 TABLET BY MOUTH EVERY DAY IN THE EVENING 90 tablet 0   naproxen (NAPROSYN) 375 MG tablet TAKE 1 TABLET BY MOUTH TWICE A DAY WITH MEALS 60 tablet 1   nebivolol (BYSTOLIC) 5 MG tablet TAKE 1 TABLET BY MOUTH EVERY DAY 90 tablet 0   NONFORMULARY OR COMPOUNDED ITEM Antifungal solution: Terbinafine 3%, Fluconazole 2%, Tea Tree Oil 5%, Urea 10%, Ibuprofen 2% in DMSO suspension #59mL 1 each 3   potassium chloride (KLOR-CON) 10 MEQ tablet Take 1 tablet (10 mEq total) by mouth 3 (three) times daily. 270 tablet  1   rosuvastatin (CRESTOR) 10 MG tablet Take 1 tablet (10 mg total) by mouth daily. 90 tablet 1   spironolactone (ALDACTONE) 25 MG tablet TAKE 1 TABLET BY MOUTH EVERY DAY 90 tablet 0   ergocalciferol (VITAMIN D2) 1.25 MG (50000 UT) capsule Take 1 capsule by mouth every 7 (seven) days.     No facility-administered medications prior to visit.    ROS Review of Systems  Constitutional:  Negative for chills, diaphoresis, fatigue and fever.  HENT: Negative.    Eyes: Negative.   Cardiovascular: Negative.  Negative for chest pain, palpitations and leg swelling.  Gastrointestinal:  Negative for abdominal pain, constipation, diarrhea, nausea and vomiting.  Endocrine: Negative.   Genitourinary:  Positive for frequency, hematuria and urgency. Negative for decreased urine volume, difficulty urinating, dyspareunia, dysuria, pelvic pain and vaginal bleeding.  Musculoskeletal:  Positive for back pain. Negative for myalgias.  Skin: Negative.  Negative for rash.  Neurological: Negative.   Hematological: Negative.  Negative for adenopathy. Does not bruise/bleed easily.  Psychiatric/Behavioral: Negative.     Objective:  BP 128/82 (BP Location: Left Arm, Patient Position: Sitting, Cuff Size: Large)   Pulse 68   Temp 98 F (36.7 C) (Oral)   Ht 5' 3.5" (1.613 m)   Wt 218 lb (98.9 kg)   LMP 05/25/2021   SpO2 98%   BMI  38.01 kg/m   BP Readings from Last 3 Encounters:  06/18/21 128/82  06/04/21 138/88  05/13/21 138/88    Wt Readings from Last 3 Encounters:  06/18/21 218 lb (98.9 kg)  06/04/21 218 lb 3.2 oz (99 kg)  05/13/21 222 lb (100.7 kg)    Physical Exam Vitals reviewed.  Constitutional:      Appearance: Normal appearance.  HENT:     Nose: Nose normal.     Mouth/Throat:     Mouth: Mucous membranes are moist.  Eyes:     General: No scleral icterus.    Conjunctiva/sclera: Conjunctivae normal.  Cardiovascular:     Rate and Rhythm: Normal rate and regular rhythm.     Heart sounds:  No murmur heard. Pulmonary:     Effort: Pulmonary effort is normal.     Breath sounds: No stridor. No wheezing, rhonchi or rales.  Abdominal:     General: Abdomen is flat. Bowel sounds are normal. There is no distension.     Palpations: Abdomen is soft. There is no hepatomegaly, splenomegaly or mass.     Tenderness: There is no abdominal tenderness.  Musculoskeletal:        General: Normal range of motion.     Cervical back: Neck supple.     Right lower leg: No edema.     Left lower leg: No edema.  Lymphadenopathy:     Cervical: No cervical adenopathy.  Skin:    General: Skin is warm and dry.  Neurological:     General: No focal deficit present.     Mental Status: She is alert.    Lab Results  Component Value Date   WBC 7.7 05/13/2021   HGB 12.4 05/13/2021   HCT 37.1 05/13/2021   PLT 359.0 05/13/2021   GLUCOSE 114 (H) 05/13/2021   CHOL 201 (H) 05/13/2021   TRIG 105.0 05/13/2021   HDL 38.80 (L) 05/13/2021   LDLDIRECT 99.0 12/21/2016   LDLCALC 141 (H) 05/13/2021   ALT 17 01/11/2020   AST 16 01/11/2020   NA 135 05/13/2021   K 3.8 05/13/2021   CL 101 05/13/2021   CREATININE 0.82 05/13/2021   BUN 8 05/13/2021   CO2 26 05/13/2021   TSH 1.96 05/13/2021   HGBA1C 6.5 05/13/2021    MR Brain Wo Contrast  Result Date: 12/05/2020 CLINICAL DATA:  Headache with upper extremity tingling EXAM: MRI HEAD WITHOUT CONTRAST TECHNIQUE: Multiplanar, multiecho pulse sequences of the brain and surrounding structures were obtained without intravenous contrast. COMPARISON:  None. FINDINGS: Brain: No acute infarct, mass effect or extra-axial collection. No acute or chronic hemorrhage. Normal white matter signal, parenchymal volume and CSF spaces. The midline structures are normal. Vascular: Major flow voids are preserved. Skull and upper cervical spine: Normal calvarium and skull base. Visualized upper cervical spine and soft tissues are normal. Sinuses/Orbits:No paranasal sinus fluid levels or  advanced mucosal thickening. No mastoid or middle ear effusion. Normal orbits. IMPRESSION: Normal brain MRI. Electronically Signed   By: Ulyses Jarred M.D.   On: 12/05/2020 01:24   MR Cervical Spine Wo Contrast  Result Date: 12/05/2020 CLINICAL DATA:  Chronic neck pain EXAM: MRI CERVICAL SPINE WITHOUT CONTRAST TECHNIQUE: Multiplanar, multisequence MR imaging of the cervical spine was performed. No intravenous contrast was administered. COMPARISON:  None. FINDINGS: Alignment: Physiologic. Vertebrae: No fracture, evidence of discitis, or bone lesion. Cord: Normal signal and morphology. Posterior Fossa, vertebral arteries, paraspinal tissues: Negative. Disc levels: C1-2: Unremarkable. C2-3: Normal disc space and facet joints. There is  no spinal canal stenosis. No neural foraminal stenosis. C3-4: Normal disc space and facet joints. There is no spinal canal stenosis. No neural foraminal stenosis. C4-5: Normal disc space and facet joints. There is no spinal canal stenosis. No neural foraminal stenosis. C5-6: Disc bulge with left-greater-than-right uncovertebral hypertrophy. There is no spinal canal stenosis. Moderate left neural foraminal stenosis. C6-7: Small disc bulge with uncovertebral hypertrophy. There is no spinal canal stenosis. Moderate right neural foraminal stenosis. C7-T1: Normal disc space and facet joints. There is no spinal canal stenosis. No neural foraminal stenosis. IMPRESSION: 1. Moderate left C5-6 and right C6-7 neural foraminal stenosis secondary to uncovertebral hypertrophy. 2. No spinal canal stenosis. Electronically Signed   By: Ulyses Jarred M.D.   On: 12/05/2020 01:22    Color, UA  yellow  yellow R  yellow  mid yellow  dark yellow     Clarity, UA  clear  clear R  clear  cloudy  clear     Glucose, UA Negative Negative  negative R  Negative  Negative  Negative     Bilirubin, UA  neg  negative R  neg  negative  negative     Ketones, UA  neg  negative R  neg  negative  negative     Spec  Grav, UA 1.010 - 1.025 1.020  1.015  1.015  1.025  1.025     Blood, UA  neg  negative R  neg  negative  negative     pH, UA 5.0 - 8.0 6.0  6.0  6.0  6.0  5.5     Protein, UA Negative Negative  negative R  Negative  Negative  Negative     Urobilinogen, UA 0.2 or 1.0 E.U./dL 0.2  0.2  0.2  0.2  0.2  1.0 R  0.2 R   Nitrite, UA  pos  Positive Abnormal  R  positive  +  negative     Leukocytes, UA Negative Trace Abnormal   Small (1+) Abnormal   Small (1+) Abnormal   4+ Abnormal   Negative  LARGE Abnormal   NEGATIVE   Appearance           Assessment & Plan:   Aprel was seen today for urinary tract infection.  Diagnoses and all orders for this visit:  UTI symptoms -     POCT Urinalysis Dipstick -     Urine Culture; Future -     CULTURE, URINE COMPREHENSIVE; Future -     CULTURE, URINE COMPREHENSIVE -     Urine Culture  Acute cystitis without hematuria- Her urine culture is positive for E. coli.  It is sensitive to nitrofurantoin. -     CULTURE, URINE COMPREHENSIVE; Future -     nitrofurantoin, macrocrystal-monohydrate, (MACROBID) 100 MG capsule; Take 1 capsule (100 mg total) by mouth 2 (two) times daily for 7 days. -     CULTURE, URINE COMPREHENSIVE  Frequent UTI -     methenamine (HIPREX) 1 g tablet; Take 1 tablet (1 g total) by mouth 2 (two) times daily with a meal.  I have discontinued Cathlean Cower D. Maddocks's ergocalciferol. I am also having her start on methenamine and nitrofurantoin (macrocrystal-monohydrate). Additionally, I am having her maintain her BIOTIN PO, Cyanocobalamin (VITAMIN B-12 IJ), ferrous sulfate, potassium chloride, clobetasol, b complex vitamins, levocetirizine, esomeprazole, NONFORMULARY OR COMPOUNDED ITEM, hydrochlorothiazide, Efinaconazole, naproxen, rosuvastatin, spironolactone, and nebivolol.  Meds ordered this encounter  Medications   methenamine (HIPREX) 1 g tablet    Sig: Take  1 tablet (1 g total) by mouth 2 (two) times daily with a meal.    Dispense:  180  tablet    Refill:  1   nitrofurantoin, macrocrystal-monohydrate, (MACROBID) 100 MG capsule    Sig: Take 1 capsule (100 mg total) by mouth 2 (two) times daily for 7 days.    Dispense:  14 capsule    Refill:  0     Follow-up: Return in about 3 months (around 09/18/2021).  Scarlette Calico, MD

## 2021-06-20 LAB — CULTURE, URINE COMPREHENSIVE

## 2021-06-26 ENCOUNTER — Ambulatory Visit (INDEPENDENT_AMBULATORY_CARE_PROVIDER_SITE_OTHER): Payer: Commercial Managed Care - PPO | Admitting: Rheumatology

## 2021-06-26 ENCOUNTER — Other Ambulatory Visit: Payer: Self-pay

## 2021-06-26 ENCOUNTER — Encounter: Payer: Self-pay | Admitting: Rheumatology

## 2021-06-26 VITALS — BP 121/79 | HR 66 | Ht 63.0 in | Wt 221.6 lb

## 2021-06-26 DIAGNOSIS — I10 Essential (primary) hypertension: Secondary | ICD-10-CM

## 2021-06-26 DIAGNOSIS — E559 Vitamin D deficiency, unspecified: Secondary | ICD-10-CM

## 2021-06-26 DIAGNOSIS — G4709 Other insomnia: Secondary | ICD-10-CM

## 2021-06-26 DIAGNOSIS — R7303 Prediabetes: Secondary | ICD-10-CM

## 2021-06-26 DIAGNOSIS — R768 Other specified abnormal immunological findings in serum: Secondary | ICD-10-CM | POA: Diagnosis not present

## 2021-06-26 DIAGNOSIS — M19041 Primary osteoarthritis, right hand: Secondary | ICD-10-CM

## 2021-06-26 DIAGNOSIS — E538 Deficiency of other specified B group vitamins: Secondary | ICD-10-CM

## 2021-06-26 DIAGNOSIS — M170 Bilateral primary osteoarthritis of knee: Secondary | ICD-10-CM | POA: Diagnosis not present

## 2021-06-26 DIAGNOSIS — E785 Hyperlipidemia, unspecified: Secondary | ICD-10-CM

## 2021-06-26 DIAGNOSIS — M19042 Primary osteoarthritis, left hand: Secondary | ICD-10-CM

## 2021-06-26 NOTE — Patient Instructions (Signed)
Hand Exercises Hand exercises can be helpful for almost anyone. These exercises can strengthen the hands, improve flexibility and movement, and increase blood flow to the hands. These results can make work and daily tasks easier. Hand exercises can be especially helpful for people who have joint pain from arthritis or have nerve damage from overuse (carpal tunnel syndrome). These exercises can also help people who have injured a hand. Exercises Most of these hand exercises are gentle stretching and motion exercises. It is usually safe to do them often throughout the day. Warming up your hands before exercise may help to reduce stiffness. You can do this with gentle massage orby placing your hands in warm water for 10-15 minutes. It is normal to feel some stretching, pulling, tightness, or mild discomfort as you begin new exercises. This will gradually improve. Stop an exercise right away if you feel sudden, severe pain or your pain gets worse. Ask your healthcare provider which exercises are best for you. Knuckle bend or "claw" fist Stand or sit with your arm, hand, and all five fingers pointed straight up. Make sure to keep your wrist straight during the exercise. Gently bend your fingers down toward your palm until the tips of your fingers are touching the top of your palm. Keep your big knuckle straight and just bend the small knuckles in your fingers. Hold this position for __________ seconds. Straighten (extend) your fingers back to the starting position. Repeat this exercise 5-10 times with each hand. Full finger fist Stand or sit with your arm, hand, and all five fingers pointed straight up. Make sure to keep your wrist straight during the exercise. Gently bend your fingers into your palm until the tips of your fingers are touching the middle of your palm. Hold this position for __________ seconds. Extend your fingers back to the starting position, stretching every joint fully. Repeat this  exercise 5-10 times with each hand. Straight fist Stand or sit with your arm, hand, and all five fingers pointed straight up. Make sure to keep your wrist straight during the exercise. Gently bend your fingers at the big knuckle, where your fingers meet your hand, and the middle knuckle. Keep the knuckle at the tips of your fingers straight and try to touch the bottom of your palm. Hold this position for __________ seconds. Extend your fingers back to the starting position, stretching every joint fully. Repeat this exercise 5-10 times with each hand. Tabletop Stand or sit with your arm, hand, and all five fingers pointed straight up. Make sure to keep your wrist straight during the exercise. Gently bend your fingers at the big knuckle, where your fingers meet your hand, as far down as you can while keeping the small knuckles in your fingers straight. Think of forming a tabletop with your fingers. Hold this position for __________ seconds. Extend your fingers back to the starting position, stretching every joint fully. Repeat this exercise 5-10 times with each hand. Finger spread Place your hand flat on a table with your palm facing down. Make sure your wrist stays straight as you do this exercise. Spread your fingers and thumb apart from each other as far as you can until you feel a gentle stretch. Hold this position for __________ seconds. Bring your fingers and thumb tight together again. Hold this position for __________ seconds. Repeat this exercise 5-10 times with each hand. Making circles Stand or sit with your arm, hand, and all five fingers pointed straight up. Make sure to keep your   wrist straight during the exercise. Make a circle by touching the tip of your thumb to the tip of your index finger. Hold for __________ seconds. Then open your hand wide. Repeat this motion with your thumb and each finger on your hand. Repeat this exercise 5-10 times with each hand. Thumb motion Sit  with your forearm resting on a table and your wrist straight. Your thumb should be facing up toward the ceiling. Keep your fingers relaxed as you move your thumb. Lift your thumb up as high as you can toward the ceiling. Hold for __________ seconds. Bend your thumb across your palm as far as you can, reaching the tip of your thumb for the small finger (pinkie) side of your palm. Hold for __________ seconds. Repeat this exercise 5-10 times with each hand. Grip strengthening  Hold a stress ball or other soft ball in the middle of your hand. Slowly increase the pressure, squeezing the ball as much as you can without causing pain. Think of bringing the tips of your fingers into the middle of your palm. All of your finger joints should bend when doing this exercise. Hold your squeeze for __________ seconds, then relax. Repeat this exercise 5-10 times with each hand. Contact a health care provider if: Your hand pain or discomfort gets much worse when you do an exercise. Your hand pain or discomfort does not improve within 2 hours after you exercise. If you have any of these problems, stop doing these exercises right away. Do not do them again unless your health care provider says that you can. Get help right away if: You develop sudden, severe hand pain or swelling. If this happens, stop doing these exercises right away. Do not do them again unless your health care provider says that you can. This information is not intended to replace advice given to you by your health care provider. Make sure you discuss any questions you have with your healthcare provider. Document Revised: 03/29/2019 Document Reviewed: 12/07/2018 Elsevier Patient Education  2022 [employer]ers, 15(4), [phone]. Retrieved September 25, 2018 from http://clinicalkey.com/nursing">  Knee Exercises Ask your health care provider which exercises are safe for you. Do exercises exactly as told by your health care  provider and adjust them as directed. It is normal to feel mild stretching, pulling, tightness, or discomfort as you do these exercises. Stop right away if you feel sudden pain or your pain gets worse. Do not begin these exercises until told by your health care provider. Stretching and range-of-motion exercises These exercises warm up your muscles and joints and improve the movement and flexibility of your knee. These exercises also help to relieve pain andswelling. Knee extension, prone Lie on your abdomen (prone position) on a bed. Place your left / right knee just beyond the edge of the surface so your knee is not on the bed. You can put a towel under your left / right thigh just above your kneecap for comfort. Relax your leg muscles and allow gravity to straighten your knee (extension). You should feel a stretch behind your left / right knee. Hold this position for __________ seconds. Scoot up so your knee is supported between repetitions. Repeat __________ times. Complete this exercise __________ times a day. Knee flexion, active  Lie on your back with both legs straight. If this causes back discomfort, bend your left / right knee so your foot is flat on the floor. Slowly slide your left / right heel back toward your buttocks.  Stop when you feel a gentle stretch in the front of your knee or thigh (flexion). Hold this position for __________ seconds. Slowly slide your left / right heel back to the starting position. Repeat __________ times. Complete this exercise __________ times a day. Quadriceps stretch, prone  Lie on your abdomen on a firm surface, such as a bed or padded floor. Bend your left / right knee and hold your ankle. If you cannot reach your ankle or pant leg, loop a belt around your foot and grab the belt instead. Gently pull your heel toward your buttocks. Your knee should not slide out to the side. You should feel a stretch in the front of your thigh and knee  (quadriceps). Hold this position for __________ seconds. Repeat __________ times. Complete this exercise __________ times a day. Hamstring, supine Lie on your back (supine position). Loop a belt or towel over the ball of your left / right foot. The ball of your foot is on the walking surface, right under your toes. Straighten your left / right knee and slowly pull on the belt to raise your leg until you feel a gentle stretch behind your knee (hamstring). Do not let your knee bend while you do this. Keep your other leg flat on the floor. Hold this position for __________ seconds. Repeat __________ times. Complete this exercise __________ times a day. Strengthening exercises These exercises build strength and endurance in your knee. Endurance is theability to use your muscles for a long time, even after they get tired. Quadriceps, isometric This exercise stretches the muscles in front of your thigh (quadriceps) without moving your knee joint (isometric). Lie on your back with your left / right leg extended and your other knee bent. Put a rolled towel or small pillow under your knee if told by your health care provider. Slowly tense the muscles in the front of your left / right thigh. You should see your kneecap slide up toward your hip or see increased dimpling just above the knee. This motion will push the back of the knee toward the floor. For __________ seconds, hold the muscle as tight as you can without increasing your pain. Relax the muscles slowly and completely. Repeat __________ times. Complete this exercise __________ times a day. Straight leg raises This exercise stretches the muscles in front of your thigh (quadriceps) and the muscles that move your hips (hip flexors). Lie on your back with your left / right leg extended and your other knee bent. Tense the muscles in the front of your left / right thigh. You should see your kneecap slide up or see increased dimpling just above the  knee. Your thigh may even shake a bit. Keep these muscles tight as you raise your leg 4-6 inches (10-15 cm) off the floor. Do not let your knee bend. Hold this position for __________ seconds. Keep these muscles tense as you lower your leg. Relax your muscles slowly and completely after each repetition. Repeat __________ times. Complete this exercise __________ times a day. Hamstring, isometric Lie on your back on a firm surface. Bend your left / right knee about __________ degrees. Dig your left / right heel into the surface as if you are trying to pull it toward your buttocks. Tighten the muscles in the back of your thighs (hamstring) to "dig" as hard as you can without increasing any pain. Hold this position for __________ seconds. Release the tension gradually and allow your muscles to relax completely for __________ seconds after  each repetition. Repeat __________ times. Complete this exercise __________ times a day. Hamstring curls If told by your health care provider, do this exercise while wearing ankle weights. Begin with __________ lb weights. Then increase the weight by 1 lb (0.5 kg) increments. Do not wear ankle weights that are more than __________ lb. Lie on your abdomen with your legs straight. Bend your left / right knee as far as you can without feeling pain. Keep your hips flat against the floor. Hold this position for __________ seconds. Slowly lower your leg to the starting position. Repeat __________ times. Complete this exercise __________ times a day. Squats This exercise strengthens the muscles in front of your thigh and knee (quadriceps). Stand in front of a table, with your feet and knees pointing straight ahead. You may rest your hands on the table for balance but not for support. Slowly bend your knees and lower your hips like you are going to sit in a chair. Keep your weight over your heels, not over your toes. Keep your lower legs upright so they are parallel  with the table legs. Do not let your hips go lower than your knees. Do not bend lower than told by your health care provider. If your knee pain increases, do not bend as low. Hold the squat position for __________ seconds. Slowly push with your legs to return to standing. Do not use your hands to pull yourself to standing. Repeat __________ times. Complete this exercise __________ times a day. Wall slides This exercise strengthens the muscles in front of your thigh and knee (quadriceps). Lean your back against a smooth wall or door, and walk your feet out 18-24 inches (46-61 cm) from it. Place your feet hip-width apart. Slowly slide down the wall or door until your knees bend __________ degrees. Keep your knees over your heels, not over your toes. Keep your knees in line with your hips. Hold this position for __________ seconds. Repeat __________ times. Complete this exercise __________ times a day. Straight leg raises This exercise strengthens the muscles that rotate the leg at the hip and move it away from your body (hip abductors). Lie on your side with your left / right leg in the top position. Lie so your head, shoulder, knee, and hip line up. You may bend your bottom knee to help you keep your balance. Roll your hips slightly forward so your hips are stacked directly over each other and your left / right knee is facing forward. Leading with your heel, lift your top leg 4-6 inches (10-15 cm). You should feel the muscles in your outer hip lifting. Do not let your foot drift forward. Do not let your knee roll toward the ceiling. Hold this position for __________ seconds. Slowly return your leg to the starting position. Let your muscles relax completely after each repetition. Repeat __________ times. Complete this exercise __________ times a day. Straight leg raises This exercise stretches the muscles that move your hips away from the front of the pelvis (hip extensors). Lie on your  abdomen on a firm surface. You can put a pillow under your hips if that is more comfortable. Tense the muscles in your buttocks and lift your left / right leg about 4-6 inches (10-15 cm). Keep your knee straight as you lift your leg. Hold this position for __________ seconds. Slowly lower your leg to the starting position. Let your leg relax completely after each repetition. Repeat __________ times. Complete this exercise __________ times a day. This  information is not intended to replace advice given to you by your health care provider. Make sure you discuss any questions you have with your healthcare provider. Document Revised: 09/26/2018 Document Reviewed: 09/26/2018 Elsevier Patient Education  2022 [employer].

## 2021-06-29 LAB — COMPLETE METABOLIC PANEL WITH GFR
AG Ratio: 1.6 (calc) (ref 1.0–2.5)
ALT: 17 U/L (ref 6–29)
AST: 16 U/L (ref 10–35)
Albumin: 4.5 g/dL (ref 3.6–5.1)
Alkaline phosphatase (APISO): 54 U/L (ref 31–125)
BUN: 13 mg/dL (ref 7–25)
CO2: 25 mmol/L (ref 20–32)
Calcium: 10.2 mg/dL (ref 8.6–10.2)
Chloride: 108 mmol/L (ref 98–110)
Creat: 0.8 mg/dL (ref 0.50–1.10)
GFR, Est African American: 103 mL/min/{1.73_m2} (ref >=60)
GFR, Est Non African American: 89 mL/min/{1.73_m2} (ref >=60)
Globulin: 2.8 g/dL (calc) (ref 1.9–3.7)
Glucose, Bld: 113 mg/dL — ABNORMAL HIGH (ref 65–99)
Potassium: 4.4 mmol/L (ref 3.5–5.3)
Sodium: 140 mmol/L (ref 135–146)
Total Bilirubin: 0.4 mg/dL (ref 0.2–1.2)
Total Protein: 7.3 g/dL (ref 6.1–8.1)

## 2021-06-29 LAB — C3 AND C4
C3 Complement: 153 mg/dL (ref 83–193)
C4 Complement: 53 mg/dL (ref 15–57)

## 2021-06-29 LAB — ANA: Anti Nuclear Antibody (ANA): NEGATIVE

## 2021-06-29 LAB — VITAMIN D 25 HYDROXY (VIT D DEFICIENCY, FRACTURES): Vit D, 25-Hydroxy: 36 ng/mL (ref 30–100)

## 2021-06-29 LAB — ANTI-DNA ANTIBODY, DOUBLE-STRANDED: ds DNA Ab: <1 IU/mL

## 2021-07-01 ENCOUNTER — Ambulatory Visit (INDEPENDENT_AMBULATORY_CARE_PROVIDER_SITE_OTHER): Payer: Commercial Managed Care - PPO

## 2021-07-01 ENCOUNTER — Other Ambulatory Visit: Payer: Self-pay

## 2021-07-01 DIAGNOSIS — E538 Deficiency of other specified B group vitamins: Secondary | ICD-10-CM | POA: Diagnosis not present

## 2021-07-01 MED ORDER — CYANOCOBALAMIN 1000 MCG/ML IJ SOLN
1000.0000 ug | Freq: Once | INTRAMUSCULAR | Status: AC
  Administered 2021-07-01: 1000 ug via INTRAMUSCULAR

## 2021-07-01 NOTE — Progress Notes (Signed)
B12 given w/o any complications. 

## 2021-07-14 ENCOUNTER — Other Ambulatory Visit: Payer: Self-pay | Admitting: Internal Medicine

## 2021-07-20 ENCOUNTER — Encounter: Payer: Commercial Managed Care - PPO | Admitting: Nurse Practitioner

## 2021-08-03 ENCOUNTER — Ambulatory Visit (INDEPENDENT_AMBULATORY_CARE_PROVIDER_SITE_OTHER): Payer: Commercial Managed Care - PPO

## 2021-08-03 ENCOUNTER — Other Ambulatory Visit: Payer: Self-pay

## 2021-08-03 DIAGNOSIS — E538 Deficiency of other specified B group vitamins: Secondary | ICD-10-CM | POA: Diagnosis not present

## 2021-08-03 MED ORDER — CYANOCOBALAMIN 1000 MCG/ML IJ SOLN
1000.0000 ug | INTRAMUSCULAR | Status: AC
  Administered 2021-08-03: 1000 ug via INTRAMUSCULAR

## 2021-08-03 NOTE — Progress Notes (Signed)
Pt here for monthly B12 injection per Dr Ronnald Ramp.  B12 1044mg given IM in left deltoid and pt tolerated injection well.  Next B12 injection scheduled for 09/17/21 during OV.

## 2021-08-15 ENCOUNTER — Other Ambulatory Visit: Payer: Self-pay | Admitting: Internal Medicine

## 2021-08-15 DIAGNOSIS — K2100 Gastro-esophageal reflux disease with esophagitis, without bleeding: Secondary | ICD-10-CM

## 2021-08-30 ENCOUNTER — Other Ambulatory Visit: Payer: Self-pay | Admitting: Internal Medicine

## 2021-08-30 DIAGNOSIS — I10 Essential (primary) hypertension: Secondary | ICD-10-CM

## 2021-09-08 ENCOUNTER — Encounter: Payer: Self-pay | Admitting: Nurse Practitioner

## 2021-09-08 ENCOUNTER — Other Ambulatory Visit: Payer: Self-pay

## 2021-09-08 ENCOUNTER — Ambulatory Visit (INDEPENDENT_AMBULATORY_CARE_PROVIDER_SITE_OTHER): Payer: Commercial Managed Care - PPO | Admitting: Nurse Practitioner

## 2021-09-08 ENCOUNTER — Other Ambulatory Visit (HOSPITAL_COMMUNITY)
Admission: RE | Admit: 2021-09-08 | Discharge: 2021-09-08 | Disposition: A | Discharge status: Home / Self Care | Payer: Commercial Managed Care - PPO | Source: Ambulatory Visit | Attending: Nurse Practitioner | Admitting: Nurse Practitioner

## 2021-09-08 VITALS — BP 124/80 | Ht 64.0 in | Wt 222.0 lb

## 2021-09-08 DIAGNOSIS — N920 Excessive and frequent menstruation with regular cycle: Secondary | ICD-10-CM | POA: Diagnosis not present

## 2021-09-08 DIAGNOSIS — R102 Pelvic and perineal pain unspecified side: Secondary | ICD-10-CM

## 2021-09-08 DIAGNOSIS — D252 Subserosal leiomyoma of uterus: Secondary | ICD-10-CM

## 2021-09-08 DIAGNOSIS — Z01419 Encounter for gynecological examination (general) (routine) without abnormal findings: Secondary | ICD-10-CM | POA: Diagnosis not present

## 2021-09-08 NOTE — Progress Notes (Signed)
Samantha Ball August 06, 1976 [id number]   History:  45 y.o. G3P2 presents to establish care and discuss fibroids. Complains of heavy bleeding with clots first 4 days of menses that require pad + tampon changes every couple of hours. Light bleeding then continues for another 3-4 days. She feels menorrhagia is progressively worsening. She also has pelvic discomfort when not on menses. Discussed hysterectomy with previous GYN provider. Ultrasound 01/2020 showed 1 subserosal fibroid measuring 4.7 x 3.5 x 4.5 cm with questionable endometrial polyp (biopsy benign). Normal pap and mammogram history.   Gynecologic History Patient's last menstrual period was 08/08/2021. Period Cycle (Days): 28 Period Duration (Days): 4 Period Pattern: Regular Menstrual Flow: Heavy Dysmenorrhea: (!) Mild Dysmenorrhea Symptoms: Cramping Contraception/Family planning: tubal ligation Sexually active: Yes  Health Maintenance Last Pap: 2019 per patient. Results were: Normal Last mammogram: 09/2020. Results were: Normal per patient Last colonoscopy: 2019. Results were: Normal, 10-year recall Last Dexa: Not indicated  Past medical history, past surgical history, family history and social history were all reviewed and documented in the EPIC chart. Married. 34 yo daughter - senior in [employer], runs track. 55 yo son - playing basketball in college in [place].   ROS:  A ROS was performed and pertinent positives and negatives are included.  Exam:  Vitals:   09/08/21 1546  BP: 124/80  Weight: 222 lb (100.7 kg)  Height: 5\' 4"  (1.626 m)   Body mass index is 38.11 kg/m.  General appearance:  Normal Thyroid:  Symmetrical, normal in size, without palpable masses or nodularity. Respiratory  Auscultation:  Clear without wheezing or rhonchi Cardiovascular  Auscultation:  Regular rate, without rubs, murmurs or gallops  Edema/varicosities:  Not grossly evident Abdominal  Soft,nontender, without masses, guarding or  rebound.  Liver/spleen:  No organomegaly noted  Hernia:  None appreciated  Skin  Inspection:  Grossly normal Breasts: Examined lying and sitting.   Right: Without masses, retractions, nipple discharge or axillary adenopathy.   Left: Without masses, retractions, nipple discharge or axillary adenopathy. Genitourinary   Inguinal/mons:  Normal without inguinal adenopathy  External genitalia:  Normal appearing vulva with no masses, tenderness, or lesions  BUS/Urethra/Skene's glands:  Normal  Vagina:  Normal appearing with normal color and discharge, no lesions  Cervix:  Normal appearing without discharge or lesions  Uterus:  Difficult to palpate due to body habitus but no gross masses or tenderness  Adnexa/parametria:     Rt: Normal in size, without masses or tenderness.   Lt: Normal in size, without masses or tenderness.  Anus and perineum: Normal  Digital rectal exam: Normal sphincter tone without palpated masses or tenderness  Patient informed chaperone available to be present for breast and pelvic exam. Patient has requested no chaperone to be present. Patient has been advised what will be completed during breast and pelvic exam.   Assessment/Plan:  45 y.o. G3p2 to establish care and discuss fibroids.   Well female exam with routine gynecological exam - Plan: Cytology - PAP( [place]). Education provided on SBEs, importance of preventative screenings, current guidelines, high calcium diet, regular exercise, and multivitamin daily. Labs with PCP.   Subserous leiomyoma of uterus - Plan: US PELVIS TRANSVAGINAL NON-OB ([facility] ONLY). Ultrasound 01/2020 showed 1 subserosal fibroid measuring 4.7 x 3.5 x 4.5 cm with questionable endometrial polyp (biopsy benign). Symptomatic with heavy menses and pelvic discomfort. Discussed hysterectomy with previous GYN.   Menorrhagia with regular cycle - Plan: US PELVIS TRANSVAGINAL NON-OB ([facility] ONLY). Complains of heavy bleeding with clots first  4 days of menses  that require pap + tampon use with changes every couple of hours. Light bleeding then continues for another 3-4 days. She feels menorrhagia is progressively worsening. We did briefly discuss bleeding management with hormonal contraception such as IUD versus hysterectomy. Will be discussed further following ultrasound.   Pelvic pain - Plan: US PELVIS TRANSVAGINAL NON-OB ([facility] ONLY).  Screening for cervical cancer - Normal Pap history. Pap collected today.  Screening for breast cancer - Normal mammogram history.  Continue annual screenings.  Normal breast exam today.  Screening for colon cancer - 2019 colonoscopy. Will repeat at GI's recommended interval.   Return in 1 year for annual.   Tamela Gammon DNP, 4:30 PM 09/08/2021

## 2021-09-09 ENCOUNTER — Other Ambulatory Visit: Payer: Self-pay | Admitting: Nurse Practitioner

## 2021-09-09 DIAGNOSIS — B373 Candidiasis of vulva and vagina: Secondary | ICD-10-CM

## 2021-09-09 DIAGNOSIS — B3731 Acute candidiasis of vulva and vagina: Secondary | ICD-10-CM

## 2021-09-09 LAB — CYTOLOGY - PAP
Comment: NEGATIVE
Diagnosis: NEGATIVE
High risk HPV: NEGATIVE

## 2021-09-09 MED ORDER — FLUCONAZOLE 150 MG PO TABS: 150.0000 mg | ORAL_TABLET | ORAL | 0 refills | Status: DC

## 2021-09-17 ENCOUNTER — Ambulatory Visit (INDEPENDENT_AMBULATORY_CARE_PROVIDER_SITE_OTHER): Payer: Commercial Managed Care - PPO | Admitting: Internal Medicine

## 2021-09-17 ENCOUNTER — Encounter: Payer: Self-pay | Admitting: Internal Medicine

## 2021-09-17 ENCOUNTER — Other Ambulatory Visit: Payer: Self-pay

## 2021-09-17 VITALS — BP 132/86 | HR 62 | Temp 98.6°F | Resp 16 | Ht 64.0 in | Wt 220.0 lb

## 2021-09-17 DIAGNOSIS — Z23 Encounter for immunization: Secondary | ICD-10-CM | POA: Diagnosis not present

## 2021-09-17 DIAGNOSIS — G43009 Migraine without aura, not intractable, without status migrainosus: Secondary | ICD-10-CM | POA: Diagnosis not present

## 2021-09-17 DIAGNOSIS — I10 Essential (primary) hypertension: Secondary | ICD-10-CM | POA: Diagnosis not present

## 2021-09-17 DIAGNOSIS — E538 Deficiency of other specified B group vitamins: Secondary | ICD-10-CM

## 2021-09-17 DIAGNOSIS — E118 Type 2 diabetes mellitus with unspecified complications: Secondary | ICD-10-CM

## 2021-09-17 LAB — CBC WITH DIFFERENTIAL/PLATELET
Basophils Absolute: 0.1 10*3/uL (ref 0.0–0.1)
Basophils Relative: 0.8 % (ref 0.0–3.0)
Eosinophils Absolute: 0.1 10*3/uL (ref 0.0–0.7)
Eosinophils Relative: 1.6 % (ref 0.0–5.0)
HCT: 40.2 % (ref 36.0–46.0)
Hemoglobin: 13.1 g/dL (ref 12.0–15.0)
Lymphocytes Relative: 34.9 % (ref 12.0–46.0)
Lymphs Abs: 2.5 10*3/uL (ref 0.7–4.0)
MCHC: 32.6 g/dL (ref 30.0–36.0)
MCV: 81.7 fl (ref 78.0–100.0)
Monocytes Absolute: 0.6 10*3/uL (ref 0.1–1.0)
Monocytes Relative: 7.9 % (ref 3.0–12.0)
Neutro Abs: 3.9 10*3/uL (ref 1.4–7.7)
Neutrophils Relative %: 54.8 % (ref 43.0–77.0)
Platelets: 273 10*3/uL (ref 150.0–400.0)
RBC: 4.91 Mil/uL (ref 3.87–5.11)
RDW: 17.1 % — ABNORMAL HIGH (ref 11.5–15.5)
WBC: 7.1 10*3/uL (ref 4.0–10.5)

## 2021-09-17 LAB — HEMOGLOBIN A1C: Hgb A1c MFr Bld: 6.5 % (ref 4.6–6.5)

## 2021-09-17 LAB — MICROALBUMIN / CREATININE URINE RATIO
Creatinine,U: 96.9 mg/dL
Microalb Creat Ratio: 2 mg/g (ref 0.0–30.0)
Microalb, Ur: 2 mg/dL — ABNORMAL HIGH (ref 0.0–1.9)

## 2021-09-17 MED ORDER — NURTEC 75 MG PO TBDP: 1.0000 | ORAL_TABLET | ORAL | 1 refills | Status: AC

## 2021-09-17 MED ORDER — CYANOCOBALAMIN 1000 MCG/ML IJ SOLN
1000.0000 ug | Freq: Once | INTRAMUSCULAR | Status: AC
  Administered 2021-09-17: 1000 ug via INTRAMUSCULAR

## 2021-09-17 NOTE — Progress Notes (Signed)
Subjective:  Patient ID: Samantha Ball, female    DOB: 1976/04/15  Age: 45 y.o. MRN: [id number]  CC: Hypertension and Diabetes  This visit occurred during the SARS-CoV-2 public health emergency.  Safety protocols were in place, including screening questions prior to the visit, additional usage of staff PPE, and extensive cleaning of exam room while observing appropriate contact time as indicated for disinfecting solutions.    HPI Samantha Ball presents for f/up -  She complains of a 4-week history of intermittent frontal headaches that she describes as an achy sensation.  She has had these headaches before and was treated for migraine.  She has recently been taking Excedrin which controls the pain but they keep recurring, about 2-3 times a week.  She says there is nothing new or different about the headaches.  She denies nausea, vomiting, slurred speech, ataxia, or paresthesias.  Outpatient Medications Prior to Visit  Medication Sig Dispense Refill   b complex vitamins capsule Take 1 capsule by mouth daily.     BIOTIN PO Take by mouth daily.     Cyanocobalamin (VITAMIN B-12 IJ) Inject as directed every 30 (thirty) days.     esomeprazole (NEXIUM) 40 MG capsule TAKE 1 CAPSULE BY MOUTH EVERY DAY 90 capsule 1   ferrous sulfate 325 (65 FE) MG tablet TAKE 1 TABLET BY MOUTH 3 TIMES DAILY WITH MEALS. 270 tablet 1   levocetirizine (XYZAL) 5 MG tablet TAKE 1 TABLET BY MOUTH EVERY DAY IN THE EVENING 90 tablet 0   methenamine (HIPREX) 1 g tablet Take 1 tablet (1 g total) by mouth 2 (two) times daily with a meal. 180 tablet 1   naproxen (NAPROSYN) 375 MG tablet TAKE 1 TABLET BY MOUTH TWICE A DAY WITH MEALS 60 tablet 1   nebivolol (BYSTOLIC) 5 MG tablet TAKE 1 TABLET BY MOUTH EVERY DAY 90 tablet 0   potassium chloride (KLOR-CON) 10 MEQ tablet Take 1 tablet (10 mEq total) by mouth 3 (three) times daily. 270 tablet 1   rosuvastatin (CRESTOR) 10 MG tablet Take 1 tablet (10 mg total) by mouth daily. 90  tablet 1   spironolactone (ALDACTONE) 25 MG tablet TAKE 1 TABLET BY MOUTH EVERY DAY 90 tablet 0   clobetasol (TEMOVATE) 0.05 % external solution Apply topically 2 (two) times daily. 50 mL 3   Efinaconazole 10 % SOLN Apply 1 drop topically daily. (Patient not taking: Reported on 09/08/2021) 4 mL 11   fluconazole (DIFLUCAN) 150 MG tablet Take 1 tablet (150 mg total) by mouth every 3 (three) days. 2 tablet 0   NONFORMULARY OR COMPOUNDED ITEM Antifungal solution: Terbinafine 3%, Fluconazole 2%, Tea Tree Oil 5%, Urea 10%, Ibuprofen 2% in DMSO suspension #57mL (Patient not taking: Reported on 09/08/2021) 1 each 3   Facility-Administered Medications Prior to Visit  Medication Dose Route Frequency Provider Last Rate Last Admin   cyanocobalamin ((VITAMIN B-12)) injection 1,000 mcg  1,000 mcg Intramuscular Q30 days [name], MD   1,000 mcg at 08/03/21 1011    ROS Review of Systems  Constitutional:  Negative for chills, diaphoresis, fatigue and fever.  HENT: Negative.  Negative for trouble swallowing.   Eyes: Negative.   Respiratory:  Negative for cough, chest tightness and wheezing.   Cardiovascular:  Negative for chest pain, palpitations and leg swelling.  Gastrointestinal:  Negative for abdominal pain, diarrhea, nausea and vomiting.  Endocrine: Negative.   Genitourinary: Negative.  Negative for difficulty urinating.  Musculoskeletal: Negative.  Negative for back pain  and myalgias.  Skin: Negative.   Neurological:  Positive for headaches. Negative for dizziness, seizures, speech difficulty and weakness.  Hematological:  Negative for adenopathy. Does not bruise/bleed easily.  Psychiatric/Behavioral: Negative.     Objective:  BP 132/86 (BP Location: Left Arm, Patient Position: Sitting, Cuff Size: Large)   Pulse 62   Temp 98.6 F (37 C) (Oral)   Resp 16   Ht 5\' 4"  (1.626 m)   Wt 220 lb (99.8 kg)   SpO2 99%   BMI 37.76 kg/m   BP Readings from Last 3 Encounters:  09/17/21 132/86   09/08/21 124/80  06/26/21 121/79    Wt Readings from Last 3 Encounters:  09/17/21 220 lb (99.8 kg)  09/08/21 222 lb (100.7 kg)  06/26/21 221 lb 9.6 oz (100.5 kg)    Physical Exam Vitals reviewed.  HENT:     Nose: Nose normal.     Mouth/Throat:     Mouth: Mucous membranes are moist.  Eyes:     General: No scleral icterus.    Extraocular Movements: Extraocular movements intact.     Pupils: Pupils are equal, round, and reactive to light.  Cardiovascular:     Rate and Rhythm: Normal rate and regular rhythm.     Heart sounds: No murmur heard. Pulmonary:     Effort: Pulmonary effort is normal.     Breath sounds: No stridor. No wheezing, rhonchi or rales.  Abdominal:     General: Abdomen is flat.     Palpations: There is no mass.     Tenderness: There is no abdominal tenderness. There is no guarding.     Hernia: No hernia is present.  Musculoskeletal:        General: Normal range of motion.     Cervical back: Neck supple.     Right lower leg: No edema.     Left lower leg: No edema.  Lymphadenopathy:     Cervical: No cervical adenopathy.  Skin:    General: Skin is warm and dry.  Neurological:     General: No focal deficit present.     Mental Status: She is alert and oriented to person, place, and time. Mental status is at baseline.     Cranial Nerves: No cranial nerve deficit.     Sensory: No sensory deficit.     Motor: No weakness.     Coordination: Coordination normal.     Gait: Gait normal.     Deep Tendon Reflexes: Reflexes normal.  Psychiatric:        Mood and Affect: Mood normal.    Lab Results  Component Value Date   WBC 7.1 09/17/2021   HGB 13.1 09/17/2021   HCT 40.2 09/17/2021   PLT 273.0 09/17/2021   GLUCOSE 113 (H) 06/26/2021   CHOL 201 (H) 05/13/2021   TRIG 105.0 05/13/2021   HDL 38.80 (L) 05/13/2021   LDLDIRECT 99.0 12/21/2016   LDLCALC 141 (H) 05/13/2021   ALT 17 06/26/2021   AST 16 06/26/2021   NA 140 06/26/2021   K 4.4 06/26/2021   CL 108  06/26/2021   CREATININE 0.80 06/26/2021   BUN 13 06/26/2021   CO2 25 06/26/2021   TSH 1.96 05/13/2021   HGBA1C 6.5 09/17/2021   MICROALBUR 2.0 (H) 09/17/2021    No results found.  Assessment & Plan:   Samantha Ball was seen today for hypertension and diabetes.  Diagnoses and all orders for this visit:  Essential hypertension, benign- She has not achieved her blood pressure  goal of 130/80.  I have asked her to improve her lifestyle modifications. -     CBC with Differential/Platelet; Future -     CBC with Differential/Platelet  Type II diabetes mellitus with manifestations ([place])- Her blood sugar is adequately well controlled. -     Microalbumin / creatinine urine ratio; Future -     Hemoglobin A1c; Future -     Ambulatory referral to Ophthalmology -     Hemoglobin A1c -     Microalbumin / creatinine urine ratio  B12 deficiency- She will continue parenteral B12 replacement therapy. -     CBC with Differential/Platelet; Future -     cyanocobalamin ((VITAMIN B-12)) injection 1,000 mcg -     CBC with Differential/Platelet  Need for vaccination -     Pneumococcal conjugate vaccine 20-valent  Migraine without aura and without status migrainosus, not intractable- I recommended that she treat this with a CGRP antagonist. -     Rimegepant Sulfate (NURTEC) 75 MG TBDP; Take 1 tablet by mouth every other day.  Other orders -     Flu Vaccine QUAD 6+ mos PF IM (Fluarix Quad PF)  I have discontinued Cathlean Cower D. Frederic's clobetasol, NONFORMULARY OR COMPOUNDED ITEM, Efinaconazole, and fluconazole. I am also having her start on Nurtec. Additionally, I am having her maintain her BIOTIN PO, Cyanocobalamin (VITAMIN B-12 IJ), ferrous sulfate, potassium chloride, b complex vitamins, levocetirizine, rosuvastatin, spironolactone, methenamine, naproxen, esomeprazole, and nebivolol. We administered cyanocobalamin. We will continue to administer cyanocobalamin.  Meds ordered this encounter  Medications    Rimegepant Sulfate (NURTEC) 75 MG TBDP    Sig: Take 1 tablet by mouth every other day.    Dispense:  46 tablet    Refill:  1   cyanocobalamin ((VITAMIN B-12)) injection 1,000 mcg      Follow-up: Return in about 6 months (around 03/17/2022).  Scarlette Calico, MD

## 2021-09-17 NOTE — Patient Instructions (Signed)
Type 2 Diabetes Mellitus, Diagnosis, Adult ?Type 2 diabetes (type 2 diabetes mellitus) is a long-term, or chronic, disease. In type 2 diabetes, one or both of these problems may be present: ?The pancreas does not make enough of a hormone called insulin. ?Cells in the body do not respond properly to the insulin that the body makes (insulin resistance). ?Normally, insulin allows blood sugar (glucose) to enter cells in the body. The cells use glucose for energy. Insulin resistance or lack of insulin causes excess glucose to build up in the blood instead of going into cells. This causes high blood glucose (hyperglycemia).  ?What are the causes? ?The exact cause of type 2 diabetes is not known. ?What increases the risk? ?The following factors may make you more likely to develop this condition: ?Having a family member with type 2 diabetes. ?Being overweight or obese. ?Being inactive (sedentary). ?Having been diagnosed with insulin resistance. ?Having a history of prediabetes, diabetes when you were pregnant (gestational diabetes), or polycystic ovary syndrome (PCOS). ?What are the signs or symptoms? ?In the early stage of this condition, you may not have symptoms. Symptoms develop slowly and may include: ?Increased thirst or hunger. ?Increased urination. ?Unexplained weight loss. ?Tiredness (fatigue) or weakness. ?Vision changes, such as blurry vision. ?Dark patches on the skin. ?How is this diagnosed? ?This condition is diagnosed based on your symptoms, your medical history, a physical exam, and your blood glucose level. Your blood glucose may be checked with one or more of the following blood tests: ?A fasting blood glucose (FBG) test. You will not be allowed to eat (you will fast) for 8 hours or longer before a blood sample is taken. ?A random blood glucose test. This test checks blood glucose at any time of day regardless of when you ate. ?An A1C (hemoglobin A1C) blood test. This test provides information about blood  glucose levels over the previous 2-3 months. ?An oral glucose tolerance test (OGTT). This test measures your blood glucose at two times: ?After fasting. This is your baseline blood glucose level. ?Two hours after drinking a beverage that contains glucose. ?You may be diagnosed with type 2 diabetes if: ?Your fasting blood glucose level is 126 mg/dL (7.0 mmol/L) or higher. ?Your random blood glucose level is 200 mg/dL (11.1 mmol/L) or higher. ?Your A1C level is 6.5% or higher. ?Your oral glucose tolerance test result is higher than 200 mg/dL (11.1 mmol/L). ?These blood tests may be repeated to confirm your diagnosis. ?How is this treated? ?Your treatment may be managed by a specialist called an endocrinologist. Type 2 diabetes may be treated by following instructions from your health care provider about: ?Making dietary and lifestyle changes. These may include: ?Following a personalized nutrition plan that is developed by a registered dietitian. ?Exercising regularly. ?Finding ways to manage stress. ?Checking your blood glucose level as often as told. ?Taking diabetes medicines or insulin daily. This helps to keep your blood glucose levels in the healthy range. ?Taking medicines to help prevent complications from diabetes. Medicines may include: ?Aspirin. ?Medicine to lower cholesterol. ?Medicine to control blood pressure. ?Your health care provider will set treatment goals for you. Your goals will be based on your age, other medical conditions you have, and how you respond to diabetes treatment. Generally, the goal of treatment is to maintain the following blood glucose levels: ?Before meals: 80-130 mg/dL (4.4-7.2 mmol/L). ?After meals: below 180 mg/dL (10 mmol/L). ?A1C level: less than 7%. ?Follow these instructions at home: ?Questions to ask your health care provider ?  Consider asking the following questions: ?Should I meet with a certified diabetes care and education specialist? ?What diabetes medicines do I need,  and when should I take them? ?What equipment will I need to manage my diabetes at home? ?How often do I need to check my blood glucose? ?Where can I find a support group for people with diabetes? ?What number can I call if I have questions? ?When is my next appointment? ?General instructions ?Take over-the-counter and prescription medicines only as told by your health care provider. ?Keep all follow-up visits. This is important. ?Where to find more information ?For help and guidance and for more information about diabetes, please visit: ?American Diabetes Association (ADA): www.diabetes.org ?American Association of Diabetes Care and Education Specialists (ADCES): www.diabeteseducator.org ?International Diabetes Federation (IDF): www.idf.org ?Contact a health care provider if: ?Your blood glucose is at or above 240 mg/dL (13.3 mmol/L) for 2 days in a row. ?You have been sick or have had a fever for 2 days or longer, and you are not getting better. ?You have any of the following problems for more than 6 hours: ?You cannot eat or drink. ?You have nausea and vomiting. ?You have diarrhea. ?Get help right away if: ?You have severe hypoglycemia. This means your blood glucose is lower than 54 mg/dL (3.0 mmol/L). ?You become confused or you have trouble thinking clearly. ?You have difficulty breathing. ?You have moderate or large ketone levels in your urine. ?These symptoms may represent a serious problem that is an emergency. Do not wait to see if the symptoms will go away. Get medical help right away. Call your local emergency services (911 in the [place] Do not drive yourself to the hospital. ?Summary ?Type 2 diabetes mellitus is a long-term, or chronic, disease. In type 2 diabetes, the pancreas does not make enough of a hormone called insulin, or cells in the body do not respond properly to insulin that the body makes. ?This condition is treated by making dietary and lifestyle changes and taking diabetes medicines or  insulin. ?Your health care provider will set treatment goals for you. Your goals will be based on your age, other medical conditions you have, and how you respond to diabetes treatment. ?Keep all follow-up visits. This is important. ?This information is not intended to replace advice given to you by your health care provider. Make sure you discuss any questions you have with your health care provider. ?Document Revised: 03/02/2021 Document Reviewed: 03/02/2021 ?Elsevier Patient Education ? 2022 Elsevier Inc. ? ?

## 2021-09-22 ENCOUNTER — Other Ambulatory Visit: Payer: Self-pay | Admitting: Internal Medicine

## 2021-10-23 ENCOUNTER — Other Ambulatory Visit: Payer: Self-pay | Admitting: Internal Medicine

## 2021-10-23 DIAGNOSIS — I10 Essential (primary) hypertension: Secondary | ICD-10-CM

## 2021-10-23 DIAGNOSIS — E876 Hypokalemia: Secondary | ICD-10-CM

## 2021-10-27 ENCOUNTER — Ambulatory Visit (INDEPENDENT_AMBULATORY_CARE_PROVIDER_SITE_OTHER): Payer: BC Managed Care – PPO

## 2021-10-27 ENCOUNTER — Other Ambulatory Visit: Payer: Self-pay

## 2021-10-27 ENCOUNTER — Encounter: Payer: Self-pay | Admitting: Obstetrics and Gynecology

## 2021-10-27 ENCOUNTER — Ambulatory Visit (INDEPENDENT_AMBULATORY_CARE_PROVIDER_SITE_OTHER): Payer: BC Managed Care – PPO | Admitting: Obstetrics and Gynecology

## 2021-10-27 VITALS — BP 130/86 | HR 65 | Ht 64.0 in | Wt 222.0 lb

## 2021-10-27 DIAGNOSIS — D252 Subserosal leiomyoma of uterus: Secondary | ICD-10-CM | POA: Diagnosis not present

## 2021-10-27 DIAGNOSIS — N920 Excessive and frequent menstruation with regular cycle: Secondary | ICD-10-CM | POA: Diagnosis not present

## 2021-10-27 DIAGNOSIS — N83202 Unspecified ovarian cyst, left side: Secondary | ICD-10-CM

## 2021-10-27 DIAGNOSIS — D219 Benign neoplasm of connective and other soft tissue, unspecified: Secondary | ICD-10-CM | POA: Diagnosis not present

## 2021-10-27 DIAGNOSIS — N840 Polyp of corpus uteri: Secondary | ICD-10-CM | POA: Diagnosis not present

## 2021-10-27 DIAGNOSIS — R102 Pelvic and perineal pain: Secondary | ICD-10-CM

## 2021-10-27 NOTE — Progress Notes (Signed)
GYNECOLOGY  VISIT   HPI: 45 y.o.   Married  [place] American  female   [phone] with Patient's last menstrual period was 09/28/2021 (approximate).   here for pelvic ultrasound.   Dealing with heavy menses for 4 years.  Monthly menses. Last 3 - 4 days and can linger for a week later.  Wearing pad and tampon with her periods and change every 2 hours.  Can stain through clothing.  Not a lot of cramping but has  a constant.  Tylenol maybe is helpful.   Had pelvic US in 2021 and EMB showing benign polyp.  Almost had hysterectomy last year, but did not due to pandemic issues.   Declines future childbearing.  Had a tubal ligation.  Has two children ages 76 and 33 yo.   Has considered an abdominoplasty in the past.   A1C 6.5 and Hgb 13.1 on 09/17/21.   Working from home.   GYNECOLOGIC HISTORY: Patient's last menstrual period was 09/28/2021 (approximate). Contraception:  BTL Menopausal hormone therapy: none Last mammogram: The pt. Thinks she had in 2021. 09-26-19 Neg/BiRads2 Last pap smear:  09-08-21 Neg:Neg HR HPV, 01-23-13 Neg        OB History     Gravida  3   Para  2   Term      Preterm      AB  1   Living  2      SAB  1   IAB      Ectopic      Multiple      Live Births                 Patient Active Problem List   Diagnosis Date Noted   Cervical cancer screening 05/13/2021   Type II diabetes mellitus with manifestations ([place]) 05/13/2021   Onychomycosis 04/27/2021   Sensorineural hearing loss 04/27/2021   Migraine without aura and without status migrainosus, not intractable 11/17/2020   Routine general medical examination at a health care facility 04/01/2020   Intrinsic eczema 04/01/2020   Vitamin D deficiency disease 11/21/2018   B12 deficiency 11/21/2018   Perennial allergic rhinitis 03/06/2018   Seasonal allergic rhinitis due to pollen 05/09/2017   Hyperlipidemia LDL goal <130 12/21/2016   Snoring 11/09/2016   Other iron deficiency anemias  12/17/2014   GERD (gastroesophageal reflux disease) 01/09/2014   Constipation 01/09/2014   Positive ANA (antinuclear antibody) 07/16/2013   Essential hypertension, benign 07/11/2013   Visit for screening mammogram 07/11/2013   Obesity (BMI 30.0-34.9) 07/11/2013    Past Medical History:  Diagnosis Date   Anemia    Fibroids    Dr. Charlesetta Garibaldi, per patient.    Hyperlipidemia    Hypertension    Intrinsic eczema 04/01/2020   Obesity    Sleep apnea    Urticaria     Past Surgical History:  Procedure Laterality Date   TUBAL LIGATION     wisdom teeth  2000    Current Outpatient Medications  Medication Sig Dispense Refill   b complex vitamins capsule Take 1 capsule by mouth daily.     BIOTIN PO Take by mouth daily.     cholecalciferol (VITAMIN D3) 25 MCG (1000 UNIT) tablet      Cyanocobalamin (VITAMIN B-12 IJ) Inject as directed every 30 (thirty) days.     esomeprazole (NEXIUM) 40 MG capsule TAKE 1 CAPSULE BY MOUTH EVERY DAY 90 capsule 1   ferrous sulfate 325 (65 FE) MG tablet TAKE 1 TABLET BY MOUTH 3  TIMES DAILY WITH MEALS. 270 tablet 1   levocetirizine (XYZAL) 5 MG tablet TAKE 1 TABLET BY MOUTH EVERY DAY IN THE EVENING 90 tablet 0   methenamine (HIPREX) 1 g tablet Take 1 tablet (1 g total) by mouth 2 (two) times daily with a meal. 180 tablet 1   naproxen (NAPROSYN) 375 MG tablet TAKE 1 TABLET BY MOUTH TWICE A DAY WITH MEALS 60 tablet 1   nebivolol (BYSTOLIC) 5 MG tablet TAKE 1 TABLET BY MOUTH EVERY DAY 90 tablet 0   potassium chloride (KLOR-CON) 10 MEQ tablet Take 1 tablet (10 mEq total) by mouth 3 (three) times daily. 270 tablet 1   Rimegepant Sulfate (NURTEC) 75 MG TBDP Take 1 tablet by mouth every other day. 46 tablet 1   rosuvastatin (CRESTOR) 10 MG tablet Take 1 tablet (10 mg total) by mouth daily. 90 tablet 1   spironolactone (ALDACTONE) 25 MG tablet TAKE 1 TABLET BY MOUTH EVERY DAY 90 tablet 0   No current facility-administered medications for this visit.     ALLERGIES:  Amlodipine  Family History  Problem Relation Age of Onset   Heart disease Father    Diabetes Maternal Aunt    Breast cancer Maternal Grandmother    Allergic rhinitis Daughter    Eczema Daughter    Alcohol abuse Neg Hx    COPD Neg Hx    Depression Neg Hx    Drug abuse Neg Hx    Early death Neg Hx    Hearing loss Neg Hx    Hyperlipidemia Neg Hx    Hypertension Neg Hx    Kidney disease Neg Hx    Stroke Neg Hx    Colon cancer Neg Hx    Rectal cancer Neg Hx    Stomach cancer Neg Hx     Social History   Socioeconomic History   Marital status: Married    Spouse name: Not on file   Number of children: Not on file   Years of education: Not on file   Highest education level: Not on file  Occupational History   Not on file  Tobacco Use   Smoking status: Never   Smokeless tobacco: Never  Vaping Use   Vaping Use: Never used  Substance and Sexual Activity   Alcohol use: No   Drug use: No   Sexual activity: Yes    Birth control/protection: Surgical    Comment: BTL  Other Topics Concern   Not on file  Social History Narrative   Not on file   Social Determinants of Health   Financial Resource Strain: Not on file  Food Insecurity: Not on file  Transportation Needs: Not on file  Physical Activity: Not on file  Stress: Not on file  Social Connections: Not on file  Intimate Partner Violence: Not on file    Review of Systems  All other systems reviewed and are negative.  PHYSICAL EXAMINATION:    BP 130/86   Pulse 65   Ht 5\' 4"  (1.626 m)   Wt 222 lb (100.7 kg)   LMP 09/28/2021 (Approximate)   SpO2 100%   BMI 38.11 kg/m     General appearance: alert, cooperative and appears stated age   Pelvic US Uterus 11.32 x 7.73 x 6.24 cm.  EMS 14 mm.  Thickened with feeder vessel. 1.7 cm.   Fibroids:  3.83 cm, 3.08 cm, 0.85 cm, 1.06 cm, 1.94 cm.  Intramural and subserous.  Right ovary normal.  Left ovary enlarged with multiseptate cyst  3.5 x 2.9 cm.  Normal perfusion to  both ovaries.  No free fluid.   ASSESSMENT  Uterine fibroids.  Endometrial polyp. Left ovarian cyst.  HTN.  Borderline DM.   PLAN  We reviewed her pelvic US report and images and discussed fibroids.  She has had an endometrial biopsy documenting her polyp.  We reviewed treatment options that are uterine sparing including hysteroscopy polypectomy in combination with ablation +/- US guided treatment of fibroids, hysteroscopic polypectomy and then a uterine artery embolization, hysteroscopic polypectomy and then medical therapy for fibroids.  We also discussed total laparoscopic hysterectomy with bilateral salpingectomy and possible left ovarian cystectomy, possible left oophorectomy, and cystoscopy.  She prefers this option for definitive treatment of her fibroids and bleeding.  Risks may include but are not limited to bleeding, transfusion, infection, damage to surrounding organs, reaction to anesthesia, DVT, PE, death, need for reoperation, and vaginal cuff dehiscence.  She wishes to proceed with surgery before the end of this year.    An After Visit Summary was printed and given to the patient.  40 min  total time was spent for this patient encounter, including preparation, face-to-face counseling with the patient, coordination of care, and documentation of the encounter.

## 2021-10-27 NOTE — Patient Instructions (Signed)
Total Laparoscopic Hysterectomy A total laparoscopic hysterectomy is a minimally invasive surgery to remove the uterus and cervix. The fallopian tubes and ovaries can also be removed during this surgery, if necessary. This procedure may be done to treat problems such as: Growths in the uterus (uterine fibroids) that are not cancer but cause symptoms. A condition that causes the lining of the uterus to grow in other areas (endometriosis). Problems with pelvic support. Cancer of the cervix, ovaries, uterus, or tissue that lines the uterus (endometrium). Excessive bleeding in the uterus. After this procedure, you will no longer be able to have a baby, and you will no longer have a menstrual period. Tell a health care provider about: Any allergies you have. All medicines you are taking, including vitamins, herbs, eye drops, creams, and over-the-counter medicines. Any problems you or family members have had with anesthetic medicines. Any blood disorders you have. Any surgeries you have had. Any medical conditions you have. Whether you are pregnant or may be pregnant. What are the risks? Generally, this is a safe procedure. However, problems may occur, including: Infection. Bleeding. Blood clots in the legs or lungs. Allergic reactions to medicines. Damage to nearby structures or organs. Having to change from this surgery to one in which a large incision is made in the abdomen (abdominal hysterectomy). What happens before the procedure? Staying hydrated Follow instructions from your health care provider about hydration, which may include: Up to 2 hours before the procedure - you may continue to drink clear liquids, such as water, clear fruit juice, black coffee, and plain tea.  Eating and drinking restrictions Follow instructions from your health care provider about eating and drinking, which may include: 8 hours before the procedure - stop eating heavy meals or foods, such as meat, fried  foods, or fatty foods. 6 hours before the procedure - stop eating light meals or foods, such as toast or cereal. 6 hours before the procedure - stop drinking milk or drinks that contain milk. 2 hours before the procedure - stop drinking clear liquids. Medicines Ask your health care provider about: Changing or stopping your regular medicines. This is especially important if you are taking diabetes medicines or blood thinners. Taking medicines such as aspirin and ibuprofen. These medicines can thin your blood. Do not take these medicines unless your health care provider tells you to take them. Taking over-the-counter medicines, vitamins, herbs, and supplements. You may be asked to take medicine that helps you have a bowel movement (laxative) to prevent constipation. General instructions If you were asked to do bowel preparation before the procedure, follow instructions from your health care provider. This procedure can affect the way you feel about yourself. Talk with your health care provider about the physical and emotional changes hysterectomy may cause. Do not use any products that contain nicotine or tobacco for at least 4 weeks before the procedure. These products include cigarettes, chewing tobacco, and vaping devices, such as e-cigarettes. If you need help quitting, ask your health care provider. Plan to have a responsible adult take you home from the hospital or clinic. Plan to have a responsible adult care for you for the time you are told after you leave the hospital or clinic. This is important. Surgery safety Ask your health care provider: How your surgery site will be marked. What steps will be taken to help prevent infection. These may include: Removing hair at the surgery site. Washing skin with a germ-killing soap. Receiving antibiotic medicine. What happens during the  procedure? An IV will be inserted into one of your veins. You will be given one or more of the following: A  medicine to help you relax (sedative). A medicine to make you fall asleep (general anesthetic). A medicine to numb the area (local anesthetic). A medicine that is injected into your spine to numb the area below and slightly above the injection site (spinal anesthetic). A medicine that is injected into an area of your body to numb everything below the injection site (regional anesthetic). A gas will be used to inflate your abdomen. This will allow your surgeon to look inside your abdomen and do the surgery. Three or four small incisions will be made in your abdomen. A small device with a light (laparoscope) will be inserted into one of your incisions. Surgical instruments will be inserted through the other incisions in order to perform the procedure. Your uterus and cervix may be removed through your vagina or cut into small pieces and removed through the small incisions. Any other organs that need to be removed will also be removed this way. The gas will be released from inside your abdomen. Your incisions will be closed with stitches (sutures), skin glue, or adhesive strips. A bandage (dressing) may be placed over your incisions. The procedure may vary among health care providers and hospitals. What happens after the procedure? Your blood pressure, heart rate, breathing rate, and blood oxygen level will be monitored until you leave the hospital or clinic. You will be given medicine for pain as needed. You will be encouraged to walk as soon as possible. You will also use a device to help you breathe or do breathing exercises to keep your lungs clear. You may have to wear compression stockings. These stockings help to prevent blood clots and reduce swelling in your legs. You will need to wear a sanitary pad for vaginal discharge or bleeding. Summary Total laparoscopic hysterectomy is a procedure to remove your uterus, cervix, and sometimes the fallopian tubes and ovaries. This procedure can  affect the way you feel about yourself. Talk with your health care provider about the physical and emotional changes hysterectomy may cause. After this procedure, you will no longer be able to have a baby, and you will no longer have a menstrual period. You will be given pain medicine to control discomfort after this procedure. Plan to have a responsible adult take you home from the hospital or clinic. This information is not intended to replace advice given to you by your health care provider. Make sure you discuss any questions you have with your health care provider. Document Revised: 08/08/2020 Document Reviewed: 08/08/2020 Elsevier Patient Education  [place].  Uterine Fibroids Uterine fibroids, also called leiomyomas, are noncancerous (benign) tumors that can grow in the uterus. They can cause heavy menstrual bleeding and pain. Fibroids may also grow in the fallopian tubes, cervix, or tissues (ligaments) near the uterus. You may have one or many fibroids. Fibroids vary in size, weight, and where they grow in the uterus. Some can become quite large. Most fibroids do not require medical treatment. What are the causes? The cause of this condition is not known. What increases the risk? You are more likely to develop this condition if you: Are in your 30s or 40s and have not gone through menopause. Have a family history of this condition. Are of African American descent. Started your menstrual period at age 36 or younger. Have never given birth. Are overweight or obese. What are  the signs or symptoms? Many women do not have any symptoms. Symptoms of this condition may include: Heavy menstrual bleeding. Bleeding between menstrual periods. Pain and pressure in the pelvic area, between your hip bones. Pain during sex. Bladder problems, such as needing to urinate right away or more often than usual. Inability to have children (infertility). Failure to carry pregnancy to term  (miscarriage). How is this diagnosed? This condition may be diagnosed based on: Your symptoms and medical history. A physical exam. A pelvic exam that includes feeling for any tumors. Imaging tests, such as ultrasound or MRI. How is this treated? Treatment for this condition may include follow-up visits with your health care provider to monitor your fibroids for any changes. Other treatment may include: Medicines, such as: Medicines to relieve pain, including aspirin and NSAIDs, such as ibuprofen or naproxen. Hormone therapy. Treatment may be given as a pill or an injection, or it may be inserted into the uterus using an intrauterine device (IUD). Surgery that would do one of the following: Remove the fibroids (myomectomy). This may be recommended if fibroids affect your fertility and you want to become pregnant. Remove the uterus (hysterectomy). Block the blood supply to the fibroids (uterine artery embolization). This can cause them to shrink and die. Follow these instructions at home: Medicines Take over-the-counter and prescription medicines only as told by your health care provider. Ask your health care provider if you should take iron pills or eat more iron-rich foods, such as dark green, leafy vegetables. Heavy menstrual bleeding can cause low iron levels. Managing pain If directed, apply heat to your back or abdomen to reduce pain. Use the heat source that your health care provider recommends, such as a moist heat pack or a heating pad. To apply heat: Place a towel between your skin and the heat source. Leave the heat on for 20-30 minutes. Remove the heat if your skin turns bright red. This is especially important if you are unable to feel pain, heat, or cold. You may have a greater risk of getting burned.  General instructions Pay close attention to your menstrual cycle. Tell your health care provider about any changes, such as: Heavier bleeding that requires you to change your  pads or tampons more than usual. A change in the number of days that your menstrual period lasts. A change in symptoms that come with your menstrual period, such as back pain or cramps in your abdomen. Keep all follow-up visits. This is important, especially if your fibroids need to be monitored for any changes. Contact a health care provider if you: Have pelvic pain, back pain, or cramps in your abdomen that do not get better with medicine or heat. Develop new bleeding between menstrual periods. Have increased bleeding during or between menstrual periods. Feel more tired or weak than usual. Feel light-headed. Get help right away if you: Faint. Have pelvic pain that suddenly gets worse. Have severe vaginal bleeding that soaks a tampon or pad in 30 minutes or less. Summary Uterine fibroids are noncancerous (benign) tumors that can develop in the uterus. The exact cause of this condition is not known. Most fibroids do not require medical treatment unless they affect your ability to have children (fertility). Contact a health care provider if you have pelvic pain, back pain, or cramps in your abdomen that do not get better with medicines. Get help right away if you faint, have pelvic pain that suddenly gets worse, or have severe vaginal bleeding. This information is not  intended to replace advice given to you by your health care provider. Make sure you discuss any questions you have with your health care provider. Document Revised: 07/08/2020 Document Reviewed: 07/08/2020 Elsevier Patient Education  [place].

## 2021-10-28 ENCOUNTER — Other Ambulatory Visit: Payer: Self-pay | Admitting: Internal Medicine

## 2021-10-28 ENCOUNTER — Telehealth: Payer: Self-pay | Admitting: Obstetrics and Gynecology

## 2021-10-28 DIAGNOSIS — J3089 Other allergic rhinitis: Secondary | ICD-10-CM

## 2021-10-28 NOTE — Telephone Encounter (Signed)
Please proceed with precert and scheduling of surgery for my patient before the end of this year.   She will have a total laparoscopic hysterectomy with bilateral salpingectomy and possible left ovarian cystectomy, possible left oophorectomy, and cystoscopy.   She has symptomatic fibroids, an endometrial polyp, and a left ovarian cyst.   Time needed will be 3 hours at [facility] outpatient.   Preop will be needed with me.

## 2021-10-29 NOTE — Telephone Encounter (Signed)
Spoke with patient regarding surgery benefits. Patient acknowledges understanding of information presented. Patient is aware that benefits presented are professional benefits only. Patient is aware the hospital will call with facility benefits. See account note.  Routing to Jill Hamm, RN.  

## 2021-10-29 NOTE — Telephone Encounter (Signed)
Left message to call Lenny Fiumara, RN at [facility], [phone].  

## 2021-10-30 NOTE — Telephone Encounter (Signed)
Spoke with patient, reviewed surgery dates of 11/28 and 12/6. Patient states she will need to review dates with her spouse and return call to advise.

## 2021-11-02 NOTE — Telephone Encounter (Signed)
Spoke with patient. Reviewed surgery dates. Patient request to proceed with surgery on 11/24/21. I will return call once surgery date and time confirmed. Patient verbalizes understanding and is agreeable.   Surgery request sent.

## 2021-11-03 NOTE — Telephone Encounter (Signed)
Spoke with patient. Surgery date request confirmed.  Advised surgery is scheduled for 11/24/21, [facility] at 0730.  Surgery instruction sheet and hospital brochure reviewed, printed copy will be mailed.Patient advised if Covid screening and quarantine requirements and agreeable.   Routing to provider. Encounter closed.  Cc: Hayley Carder

## 2021-11-04 ENCOUNTER — Other Ambulatory Visit: Payer: Self-pay

## 2021-11-04 ENCOUNTER — Ambulatory Visit (INDEPENDENT_AMBULATORY_CARE_PROVIDER_SITE_OTHER): Payer: BC Managed Care – PPO

## 2021-11-04 DIAGNOSIS — E538 Deficiency of other specified B group vitamins: Secondary | ICD-10-CM | POA: Diagnosis not present

## 2021-11-04 MED ORDER — CYANOCOBALAMIN 1000 MCG/ML IJ SOLN
1000.0000 ug | Freq: Once | INTRAMUSCULAR | Status: AC
  Administered 2021-11-04: 1000 ug via INTRAMUSCULAR

## 2021-11-04 NOTE — Progress Notes (Signed)
Pt was given B12 w/o any complications. 

## 2021-11-09 ENCOUNTER — Ambulatory Visit (INDEPENDENT_AMBULATORY_CARE_PROVIDER_SITE_OTHER): Payer: BC Managed Care – PPO | Admitting: Obstetrics and Gynecology

## 2021-11-09 ENCOUNTER — Encounter: Payer: Self-pay | Admitting: Obstetrics and Gynecology

## 2021-11-09 ENCOUNTER — Other Ambulatory Visit: Payer: Self-pay

## 2021-11-09 VITALS — BP 128/80 | HR 94 | Ht 64.0 in | Wt 222.0 lb

## 2021-11-09 DIAGNOSIS — Z01818 Encounter for other preprocedural examination: Secondary | ICD-10-CM

## 2021-11-09 DIAGNOSIS — N920 Excessive and frequent menstruation with regular cycle: Secondary | ICD-10-CM

## 2021-11-09 DIAGNOSIS — N840 Polyp of corpus uteri: Secondary | ICD-10-CM

## 2021-11-09 DIAGNOSIS — D219 Benign neoplasm of connective and other soft tissue, unspecified: Secondary | ICD-10-CM | POA: Diagnosis not present

## 2021-11-09 LAB — CBC
HCT: 41.3 % (ref 35.0–45.0)
Hemoglobin: 13.5 g/dL (ref 11.7–15.5)
MCH: 27.2 pg (ref 27.0–33.0)
MCHC: 32.7 g/dL (ref 32.0–36.0)
MCV: 83.3 fL (ref 80.0–100.0)
MPV: 11.1 fL (ref 7.5–12.5)
Platelets: 296 10*3/uL (ref 140–400)
RBC: 4.96 10*6/uL (ref 3.80–5.10)
RDW: 15.8 % — ABNORMAL HIGH (ref 11.0–15.0)
WBC: 7.8 10*3/uL (ref 3.8–10.8)

## 2021-11-09 NOTE — Progress Notes (Signed)
GYNECOLOGY  VISIT   HPI: 45 y.o.   Married  [place] American  female   [phone] with Patient's last menstrual period was 11/04/2021.   here for surgical consultation for [place] on 11/24/21.   Patient has heavy menstrual bleeding of 4 years duration.  She has fibroids and an endometrial polyp.   Pelvic US on 10/27/21: Uterus 11.32 x 7.73 x 6.24 cm.  EMS 14 mm.  Thickened with feeder vessel. 1.7 cm.   Fibroids:  3.83 cm, 3.08 cm, 0.85 cm, 1.06 cm, 1.94 cm.  Intramural and subserous.  Right ovary normal.  Left ovary enlarged with multiseptate cyst 3.5 x 2.9 cm.  Normal perfusion to both ovaries.  No free fluid.   She declines future childbearing.  She has had a tubal ligation.   Last A1C 6.5 on 09/17/21.   Hgb 13.1 on 09/17/21.   Takes methanamine for UTI prevention.   Not using her hearing aides.   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  She has not provided disability papers to the office for her surgical recovery.  GYNECOLOGIC HISTORY: Patient's last menstrual period was 11/04/2021. Contraception:  tubal ligation Menopausal hormone therapy:  n/a Last mammogram:  09/2020- WNL per pt Last pap smear:   09/08/21- WNL, HPV- neg        OB History     Gravida  3   Para  2   Term      Preterm      AB  1   Living  2      SAB  1   IAB      Ectopic      Multiple      Live Births                 Patient Active Problem List   Diagnosis Date Noted   Cervical cancer screening 05/13/2021   Type II diabetes mellitus with manifestations ([place]) 05/13/2021   Onychomycosis 04/27/2021   Sensorineural hearing loss 04/27/2021   Migraine without aura and without status migrainosus, not intractable 11/17/2020   Routine general medical examination at a health care facility 04/01/2020   Intrinsic eczema 04/01/2020   Vitamin D deficiency disease 11/21/2018   B12 deficiency 11/21/2018   Perennial allergic rhinitis 03/06/2018   Seasonal allergic  rhinitis due to pollen 05/09/2017   Hyperlipidemia LDL goal <130 12/21/2016   Snoring 11/09/2016   Other iron deficiency anemias 12/17/2014   GERD (gastroesophageal reflux disease) 01/09/2014   Constipation 01/09/2014   Positive ANA (antinuclear antibody) 07/16/2013   Essential hypertension, benign 07/11/2013   Visit for screening mammogram 07/11/2013   Obesity (BMI 30.0-34.9) 07/11/2013    Past Medical History:  Diagnosis Date   Anemia    Fibroids    Dr. Charlesetta Garibaldi, per patient.    Hyperlipidemia    Hypertension    Intrinsic eczema 04/01/2020   Obesity    Sleep apnea    not currently using CPAP   Urticaria     Past Surgical History:  Procedure Laterality Date   TUBAL LIGATION     wisdom teeth  2000    Current Outpatient Medications  Medication Sig Dispense Refill   b complex vitamins capsule Take 1 capsule by mouth daily.     BIOTIN PO Take by mouth daily.     cholecalciferol (VITAMIN D3) 25 MCG (1000 UNIT) tablet      Cyanocobalamin (VITAMIN B-12 IJ) Inject as directed every 30 (thirty) days.  esomeprazole (NEXIUM) 40 MG capsule TAKE 1 CAPSULE BY MOUTH EVERY DAY 90 capsule 1   ferrous sulfate 325 (65 FE) MG tablet TAKE 1 TABLET BY MOUTH 3 TIMES DAILY WITH MEALS. 270 tablet 1   levocetirizine (XYZAL) 5 MG tablet TAKE 1 TABLET BY MOUTH EVERY DAY IN THE EVENING 90 tablet 1   methenamine (HIPREX) 1 g tablet Take 1 tablet (1 g total) by mouth 2 (two) times daily with a meal. 180 tablet 1   Multiple Vitamins-Minerals (ONE-A-DAY WOMENS PO) Take by mouth.     naproxen (NAPROSYN) 375 MG tablet TAKE 1 TABLET BY MOUTH TWICE A DAY WITH MEALS 60 tablet 1   nebivolol (BYSTOLIC) 5 MG tablet TAKE 1 TABLET BY MOUTH EVERY DAY 90 tablet 0   Rimegepant Sulfate (NURTEC) 75 MG TBDP Take 1 tablet by mouth every other day. 46 tablet 1   rosuvastatin (CRESTOR) 10 MG tablet Take 1 tablet (10 mg total) by mouth daily. 90 tablet 1   spironolactone (ALDACTONE) 25 MG tablet TAKE 1 TABLET BY MOUTH  EVERY DAY 90 tablet 0   potassium chloride (KLOR-CON) 10 MEQ tablet Take 1 tablet (10 mEq total) by mouth 3 (three) times daily. (Patient not taking: Reported on 11/09/2021) 270 tablet 1   No current facility-administered medications for this visit.     ALLERGIES: Amlodipine  Family History  Problem Relation Age of Onset   Heart disease Father    Diabetes Maternal Aunt    Breast cancer Maternal Grandmother    Allergic rhinitis Daughter    Eczema Daughter    Alcohol abuse Neg Hx    COPD Neg Hx    Depression Neg Hx    Drug abuse Neg Hx    Early death Neg Hx    Hearing loss Neg Hx    Hyperlipidemia Neg Hx    Hypertension Neg Hx    Kidney disease Neg Hx    Stroke Neg Hx    Colon cancer Neg Hx    Rectal cancer Neg Hx    Stomach cancer Neg Hx     Social History   Socioeconomic History   Marital status: Married    Spouse name: Not on file   Number of children: Not on file   Years of education: Not on file   Highest education level: Not on file  Occupational History   Not on file  Tobacco Use   Smoking status: Never   Smokeless tobacco: Never  Vaping Use   Vaping Use: Never used  Substance and Sexual Activity   Alcohol use: No   Drug use: No   Sexual activity: Yes    Birth control/protection: Surgical    Comment: BTL  Other Topics Concern   Not on file  Social History Narrative   Not on file   Social Determinants of Health   Financial Resource Strain: Not on file  Food Insecurity: Not on file  Transportation Needs: Not on file  Physical Activity: Not on file  Stress: Not on file  Social Connections: Not on file  Intimate Partner Violence: Not on file    Review of Systems  PHYSICAL EXAMINATION:    BP 128/80 (BP Location: Right Arm, Patient Position: Sitting)   Pulse 94   Ht 5\' 4"  (1.626 m)   Wt 222 lb (100.7 kg)   LMP 11/04/2021   SpO2 97%   BMI 38.11 kg/m     General appearance: alert, cooperative and appears stated age Head: Normocephalic,  without obvious  abnormality, atraumatic Neck: no adenopathy, supple, symmetrical, trachea midline and thyroid normal to inspection and palpation Lungs: clear to auscultation bilaterally Breasts: normal appearance, no masses or tenderness, No nipple retraction or dimpling, No nipple discharge or bleeding, No axillary or supraclavicular adenopathy Heart: regular rate and rhythm Abdomen: soft, non-tender, no masses,  no organomegaly Extremities: extremities normal, atraumatic, no cyanosis or edema Skin: Skin color, texture, turgor normal. No rashes or lesions Lymph nodes: Cervical, supraclavicular, and axillary nodes normal. No abnormal inguinal nodes palpated Neurologic: Grossly normal  Pelvic: External genitalia:  no lesions              Urethra:  normal appearing urethra with no masses, tenderness or lesions              Bartholins and Skenes: normal                 Vagina: normal appearing vagina with normal color and discharge, no lesions              Cervix: no lesions.  Mild blood flow.                Bimanual Exam:  Uterus:  normal size, contour, position, consistency, mobility, non-tender              Adnexa: no mass, fullness, tenderness           Chaperone was present for exam:  Sharee Pimple, RN  ASSESSMENT  Uterine fibroids.  Endometrial polyp. Left ovarian cyst.  HTN.  Borderline DM.   PLAN  Proceed with TLH, bilateral salpingectomy, left ovarian cystectomy, possible bilateral oophorectomy, cystoscopy.  Risks, benefits, and alternatives have been reviewed with the patient who wishes to proceed.  The risk of conversion to a laparotomy incision has also been reviewed.   Check CBC today.  She will be out of work of work for 6 weeks.  Will get a copy of her mammogram.   An After Visit Summary was printed and given to the patient.

## 2021-11-16 ENCOUNTER — Telehealth: Payer: Self-pay

## 2021-11-16 NOTE — Telephone Encounter (Signed)
Needs ICD-10 codes, out of work date, return to work date, office appt dates and treatment plan.   She said you can call her and leave in voice mail or you can complete the form and send it back.  Routed to [place].

## 2021-11-17 ENCOUNTER — Other Ambulatory Visit: Payer: Self-pay

## 2021-11-17 ENCOUNTER — Encounter (HOSPITAL_BASED_OUTPATIENT_CLINIC_OR_DEPARTMENT_OTHER): Payer: Self-pay | Admitting: Obstetrics and Gynecology

## 2021-11-17 DIAGNOSIS — D219 Benign neoplasm of connective and other soft tissue, unspecified: Secondary | ICD-10-CM | POA: Diagnosis not present

## 2021-11-17 DIAGNOSIS — Z974 Presence of external hearing-aid: Secondary | ICD-10-CM

## 2021-11-17 DIAGNOSIS — Z01818 Encounter for other preprocedural examination: Secondary | ICD-10-CM | POA: Diagnosis not present

## 2021-11-17 DIAGNOSIS — Z973 Presence of spectacles and contact lenses: Secondary | ICD-10-CM

## 2021-11-17 HISTORY — DX: Presence of spectacles and contact lenses: Z97.3

## 2021-11-17 HISTORY — DX: Presence of external hearing-aid: Z97.4

## 2021-11-17 NOTE — Progress Notes (Signed)
Spoke w/ via phone for pre-op interview---pt Lab needs dos----  urine preg             Lab results------lab appt 11-20-2021 1015 for cbc bmp t & s, ekg COVID test -----patient states asymptomatic no test needed Arrive at -------530 am 11-24-2021 NPO after MN NO Solid Food.  Clear liquids from MN until---430 am Med rec completed Medications to take morning of surgery -----nexium, bystolic, rosuvastatin, methamine Diabetic medication -----diet controlled no meds Patient instructed no nail polish to be worn day of surgery Patient instructed to bring photo id and insurance card day of surgery Patient aware to have Driver (ride ) / caregiver    for 24 hours after surgery  husband dusharn Patient Special Instructions -----pt given extended recovery instructions Pre-Op special Istructions -----none Patient verbalized understanding of instructions that were given at this phone interview. Patient denies shortness of breath, chest pain, fever, cough at this phone interview.

## 2021-11-17 NOTE — Progress Notes (Signed)
Your procedure is scheduled on  11-24-2021  Report to [facility]   Call this number if you have problems the morning of surgery  :[phone].   OUR ADDRESS IS [address].  WE ARE LOCATED IN [facility].  PLEASE BRING YOUR INSURANCE CARD AND PHOTO ID DAY OF SURGERY.  ONLY ONE PERSON ALLOWED IN FACILITY WAITING AREA.                                     REMEMBER:  DO NOT EAT FOOD, CANDY GUM OR MINTS  AFTER MIDNIGHT . YOU MAY HAVE CLEAR LIQUIDS FROM MIDNIGHT UNTIL 430 AM. NO CLEAR LIQUIDS AFTER  430 AM DAY OF SURGERY. DRINK G2 GATORADE AT 6 AM THEN NOTHING BY MOUTH MORNING OF YOUR SURGERY.   YOU MAY  BRUSH YOUR TEETH MORNING OF SURGERY AND RINSE YOUR MOUTH OUT, NO CHEWING GUM CANDY OR MINTS.    CLEAR LIQUID DIET   Foods Allowed                                                                     Foods Excluded  Coffee and tea, regular and decaf                             liquids that you cannot  Plain Jell-O any favor except red or purple                                           see through such as: Fruit ices (not with fruit pulp)                                     milk, soups, orange juice  Iced Popsicles                                    All solid food Carbonated beverages, regular and diet                                    Cranberry, grape and apple juices Sports drinks like Gatorade Lightly seasoned clear broth or consume(fat free) Sugar  Sample Menu Breakfast                                Lunch                                     Supper Cranberry juice  Jell-O                                     Grape juice                           Apple juice Coffee or tea                        Jell-O                                      Popsicle                                                Coffee or tea                        Coffee or  tea  _____________________________________________________________________     TAKE THESE MEDICATIONS MORNING OF SURGERY WITH A SIP OF WATER: NEXIUM, BYSTOLIC, ROSUVASTATIN, METHAMINE  ONE VISITOR IS ALLOWED IN WAITING ROOM ONLY DAY OF SURGERY.  YOU MAY HAVE ANOTHER PERSON SWITCH OUT WITH THE  1  VISITOR IN THE WAITING ROOM DAY OF SURGERY AND A MASK MUST BE WORN IN THE WAITING ROOM.    2 VISITORS  MAY VISIT IN THE EXTENDED RECOVERY ROOM UNTIL 800 PM ONLY 1 VISITOR AGE 2 AND OVER MAY SPEND THE NIGHT AND MUST BE IN EXTENDED RECOVERY ROOM NO LATER THAN 800 PM .   UP TO 2 CHILDREN AGE 41 TO 15 MAY ALSO VISIT IN EXTENDED RECOVERY ROOM ONLY UNTIL 800 PM AND MUST LEAVE BY 800 PM. ALL PERSONS VISITING IN EXTENDED RECOVERY ROOM MUST WEAR A MASK.                                    DO NOT WEAR JEWERLY, MAKE UP. DO NOT WEAR LOTIONS, POWDERS, PERFUMES OR NAIL POLISH ON YOUR FINGERNAILS. TOENAIL POLISH IS OK TO WEAR. DO NOT SHAVE FOR 48 HOURS PRIOR TO DAY OF SURGERY. MEN MAY SHAVE FACE AND NECK. CONTACTS, GLASSES, OR DENTURES MAY NOT BE WORN TO SURGERY.                                    [facility] IS NOT RESPONSIBLE  FOR ANY BELONGINGS.                                                                    Marland Kitchen           [facility] - Preparing for Surgery Before surgery, you can play an important role.  Because skin is not sterile, your skin needs to be as free of germs as possible.  You can reduce the number of germs on your  skin by washing with CHG (chlorahexidine gluconate) soap before surgery.  CHG is an antiseptic cleaner which kills germs and bonds with the skin to continue killing germs even after washing. Please DO NOT use if you have an allergy to CHG or antibacterial soaps.  If your skin becomes reddened/irritated stop using the CHG and inform your nurse when you arrive at Short Stay. Do not shave (including legs and underarms) for at least 48 hours prior to the first CHG shower.  You may shave your  face/neck. Please follow these instructions carefully:  1.  Shower with CHG Soap the night before surgery and the  morning of Surgery.  2.  If you choose to wash your hair, wash your hair first as usual with your  normal  shampoo.  3.  After you shampoo, rinse your hair and body thoroughly to remove the  shampoo.                            4.  Use CHG as you would any other liquid soap.  You can apply chg directly  to the skin and wash                      Gently with a scrungie or clean washcloth.  5.  Apply the CHG Soap to your body ONLY FROM THE NECK DOWN.   Do not use on face/ open                           Wound or open sores. Avoid contact with eyes, ears mouth and genitals (private parts).                       Wash face,  Genitals (private parts) with your normal soap.             6.  Wash thoroughly, paying special attention to the area where your surgery  will be performed.  7.  Thoroughly rinse your body with warm water from the neck down.  8.  DO NOT shower/wash with your normal soap after using and rinsing off  the CHG Soap.                9.  Pat yourself dry with a clean towel.            10.  Wear clean pajamas.            11.  Place clean sheets on your bed the night of your first shower and do not  sleep with pets. Day of Surgery : Do not apply any lotions/deodorants the morning of surgery.  Please wear clean clothes to the hospital/surgery center.  IF YOU HAVE ANY SKIN IRRITATION OR PROBLEMS WITH THE SURGICAL SOAP, PLEASE GET A BAR OF GOLD DIAL SOAP AND SHOWER THE NIGHT BEFORE YOUR SURGERY AND THE MORNING OF YOUR SURGERY. PLEASE LET THE NURSE KNOW MORNING OF YOUR SURGERY IF YOU HAD ANY PROBLEMS WITH THE SURGICAL SOAP.  FAILURE TO FOLLOW THESE INSTRUCTIONS MAY RESULT IN THE CANCELLATION OF YOUR SURGERY PATIENT SIGNATURE_________________________________  NURSE  SIGNATURE__________________________________  ________________________________________________________________________  QUESTIONS CALL Christia Domke PRE OP NURSE PHONE [phone].

## 2021-11-18 NOTE — Telephone Encounter (Signed)
This rep called again in triage voice mail today. Routed to [place].

## 2021-11-18 NOTE — Telephone Encounter (Signed)
Short Term Disability paperwork faxed to [employer].  Return call to representative. Left voicemail with information and informed her that the forms had been faxed over.  Call to patient to inform that forms have been completed and faxed.  Encounter closed.

## 2021-11-20 ENCOUNTER — Encounter (HOSPITAL_COMMUNITY)
Admission: RE | Admit: 2021-11-20 | Discharge: 2021-11-20 | Disposition: A | Discharge status: Home / Self Care | Payer: BC Managed Care – PPO | Source: Ambulatory Visit | Attending: Obstetrics and Gynecology | Admitting: Obstetrics and Gynecology

## 2021-11-20 ENCOUNTER — Other Ambulatory Visit: Payer: Self-pay

## 2021-11-20 DIAGNOSIS — Z01818 Encounter for other preprocedural examination: Secondary | ICD-10-CM | POA: Diagnosis not present

## 2021-11-20 DIAGNOSIS — D219 Benign neoplasm of connective and other soft tissue, unspecified: Secondary | ICD-10-CM | POA: Diagnosis not present

## 2021-11-20 LAB — BASIC METABOLIC PANEL
Anion gap: 7 (ref 5–15)
BUN: 10 mg/dL (ref 6–20)
CO2: 24 mmol/L (ref 22–32)
Calcium: 9.4 mg/dL (ref 8.9–10.3)
Chloride: 102 mmol/L (ref 98–111)
Creatinine, Ser: 0.82 mg/dL (ref 0.44–1.00)
GFR, Estimated: >60 mL/min (ref >60)
Glucose, Bld: 121 mg/dL — ABNORMAL HIGH (ref 70–99)
Potassium: 3.9 mmol/L (ref 3.5–5.1)
Sodium: 133 mmol/L — ABNORMAL LOW (ref 135–145)

## 2021-11-20 LAB — CBC
HCT: 39.6 % (ref 36.0–46.0)
Hemoglobin: 13.2 g/dL (ref 12.0–15.0)
MCH: 27.2 pg (ref 26.0–34.0)
MCHC: 33.3 g/dL (ref 30.0–36.0)
MCV: 81.6 fL (ref 80.0–100.0)
Platelets: 281 10*3/uL (ref 150–400)
RBC: 4.85 MIL/uL (ref 3.87–5.11)
RDW: 16.2 % — ABNORMAL HIGH (ref 11.5–15.5)
WBC: 9 10*3/uL (ref 4.0–10.5)
nRBC: 0 % (ref 0.0–0.2)

## 2021-11-21 ENCOUNTER — Other Ambulatory Visit: Payer: Self-pay | Admitting: Internal Medicine

## 2021-11-21 DIAGNOSIS — E785 Hyperlipidemia, unspecified: Secondary | ICD-10-CM

## 2021-11-23 NOTE — H&P (Signed)
Office Visit 11/09/2021 [facility], Samantha All, MD Obstetrics and Gynecology Menorrhagia with regular cycle +2 more Dx Referred by Samantha Lima, MD Reason for Visit   Additional Documentation  Vitals:  BP 128/80 (BP Location: Right Arm, Patient Position: Sitting) Pulse 94 Ht 5\' 4"  (1.626 m) Wt 100.7 kg LMP 11/04/2021 SpO2 97% BMI 38.11 kg/m BSA 2.13 m  More Vitals  Flowsheets:  Anthropometrics, Menstrual History, NEWS, MEWS Score   Encounter Info:  Billing Info, History, Allergies, Detailed Report    Ball Notes    Progress Notes by Samantha Cobbs, MD at 11/09/2021 11:30 AM  Author: [name], MD Author Type: Physician Filed: 11/13/2021  6:00 PM  Note Status: Signed Cosign: Cosign Not Required Encounter Date: 11/09/2021  Editor: Samantha Cobbs, MD (Physician)      Prior Versions: 1. Samantha Ball, [place] (Scientist, research (life sciences)) at 11/09/2021 11:34 AM - Sign when Signing Visit   2. Samantha Ball, CMA (Certified Medical Assistant) at 11/09/2021 11:10 AM - Sign when Signing Visit    GYNECOLOGY  VISIT   HPI: 45 y.o.   Married  [place] American  female   [phone] with Patient's last menstrual period was 11/04/2021.   here for surgical consultation for [place] on 11/24/21.    Patient has heavy menstrual bleeding of 4 years duration.  She has fibroids and an endometrial polyp.    Pelvic US on 10/27/21: Uterus 11.32 x 7.73 x 6.24 cm.  EMS 14 mm.  Thickened with feeder vessel. 1.7 cm.   Fibroids:  3.83 cm, 3.08 cm, 0.85 cm, 1.06 cm, 1.94 cm.  Intramural and subserous.  Right ovary normal.  Left ovary enlarged with multiseptate cyst 3.5 x 2.9 cm.  Normal perfusion to both ovaries.  No free fluid.    She declines future childbearing.  She has had a tubal ligation.    Last A1C 6.5 on 09/17/21.    Hgb 13.1 on 09/17/21.   Takes methanamine for UTI prevention.    Not using her  hearing aides.    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  She has not provided disability papers to the office for her surgical recovery.   GYNECOLOGIC HISTORY: Patient's last menstrual period was 11/04/2021. Contraception:  tubal ligation Menopausal hormone therapy:  n/a Last mammogram:  09/2020- WNL per pt Last pap smear:   09/08/21- WNL, HPV- neg        OB History       Gravida  3   Para  2   Term      Preterm      AB  1   Living  2        SAB  1   IAB      Ectopic      Multiple      Live Births                        Patient Active Problem List    Diagnosis Date Noted   Cervical cancer screening 05/13/2021   Type II diabetes mellitus with manifestations ([place]) 05/13/2021   Onychomycosis 04/27/2021   Sensorineural hearing loss 04/27/2021   Migraine without aura and without status migrainosus, not intractable 11/17/2020   Routine general medical examination at a health care facility 04/01/2020   Intrinsic eczema 04/01/2020   Vitamin D deficiency disease 11/21/2018   B12  deficiency 11/21/2018   Perennial allergic rhinitis 03/06/2018   Seasonal allergic rhinitis due to pollen 05/09/2017   Hyperlipidemia LDL goal <130 12/21/2016   Snoring 11/09/2016   Other iron deficiency anemias 12/17/2014   GERD (gastroesophageal reflux disease) 01/09/2014   Constipation 01/09/2014   Positive ANA (antinuclear antibody) 07/16/2013   Essential hypertension, benign 07/11/2013   Visit for screening mammogram 07/11/2013   Obesity (BMI 30.0-34.9) 07/11/2013          Past Medical History:  Diagnosis Date   Anemia     Fibroids      Dr. Charlesetta Garibaldi, per patient.    Hyperlipidemia     Hypertension     Intrinsic eczema 04/01/2020   Obesity     Sleep apnea      not currently using CPAP   Urticaria             Past Surgical History:  Procedure Laterality Date   TUBAL LIGATION       wisdom teeth   2000            Current Outpatient  Medications  Medication Sig Dispense Refill   b complex vitamins capsule Take 1 capsule by mouth daily.       BIOTIN PO Take by mouth daily.       cholecalciferol (VITAMIN D3) 25 MCG (1000 UNIT) tablet         Cyanocobalamin (VITAMIN B-12 IJ) Inject as directed every 30 (thirty) days.       esomeprazole (NEXIUM) 40 MG capsule TAKE 1 CAPSULE BY MOUTH EVERY DAY 90 capsule 1   ferrous sulfate 325 (65 FE) MG tablet TAKE 1 TABLET BY MOUTH 3 TIMES DAILY WITH MEALS. 270 tablet 1   levocetirizine (XYZAL) 5 MG tablet TAKE 1 TABLET BY MOUTH EVERY DAY IN THE EVENING 90 tablet 1   methenamine (HIPREX) 1 g tablet Take 1 tablet (1 g total) by mouth 2 (two) times daily with a meal. 180 tablet 1   Multiple Vitamins-Minerals (ONE-A-DAY WOMENS PO) Take by mouth.       naproxen (NAPROSYN) 375 MG tablet TAKE 1 TABLET BY MOUTH TWICE A DAY WITH MEALS 60 tablet 1   nebivolol (BYSTOLIC) 5 MG tablet TAKE 1 TABLET BY MOUTH EVERY DAY 90 tablet 0   Rimegepant Sulfate (NURTEC) 75 MG TBDP Take 1 tablet by mouth every other day. 46 tablet 1   rosuvastatin (CRESTOR) 10 MG tablet Take 1 tablet (10 mg total) by mouth daily. 90 tablet 1   spironolactone (ALDACTONE) 25 MG tablet TAKE 1 TABLET BY MOUTH EVERY DAY 90 tablet 0   potassium chloride (KLOR-CON) 10 MEQ tablet Take 1 tablet (10 mEq total) by mouth 3 (three) times daily. (Patient not taking: Reported on 11/09/2021) 270 tablet 1    No current facility-administered medications for this visit.      ALLERGIES: Amlodipine        Family History  Problem Relation Age of Onset   Heart disease Father     Diabetes Maternal Aunt     Breast cancer Maternal Grandmother     Allergic rhinitis Daughter     Eczema Daughter     Alcohol abuse Neg Hx     COPD Neg Hx     Depression Neg Hx     Drug abuse Neg Hx     Early death Neg Hx     Hearing loss Neg Hx     Hyperlipidemia Neg Hx     Hypertension Neg Hx  Kidney disease Neg Hx     Stroke Neg Hx     Colon cancer Neg Hx      Rectal cancer Neg Hx     Stomach cancer Neg Hx        Social History         Socioeconomic History   Marital status: Married      Spouse name: Not on file   Number of children: Not on file   Years of education: Not on file   Highest education level: Not on file  Occupational History   Not on file  Tobacco Use   Smoking status: Never   Smokeless tobacco: Never  Vaping Use   Vaping Use: Never used  Substance and Sexual Activity   Alcohol use: No   Drug use: No   Sexual activity: Yes      Birth control/protection: Surgical      Comment: BTL  Other Topics Concern   Not on file  Social History Narrative   Not on file    Social Determinants of Health    Financial Resource Strain: Not on file  Food Insecurity: Not on file  Transportation Needs: Not on file  Physical Activity: Not on file  Stress: Not on file  Social Connections: Not on file  Intimate Partner Violence: Not on file      Review of Systems   PHYSICAL EXAMINATION:     BP 128/80 (BP Location: Right Arm, Patient Position: Sitting)   Pulse 94   Ht 5\' 4"  (1.626 m)   Wt 222 lb (100.7 kg)   LMP 11/04/2021   SpO2 97%   BMI 38.11 kg/m     General appearance: alert, cooperative and appears stated age Head: Normocephalic, without obvious abnormality, atraumatic Neck: no adenopathy, supple, symmetrical, trachea midline and thyroid normal to inspection and palpation Lungs: clear to auscultation bilaterally Breasts: normal appearance, no masses or tenderness, No nipple retraction or dimpling, No nipple discharge or bleeding, No axillary or supraclavicular adenopathy Heart: regular rate and rhythm Abdomen: soft, non-tender, no masses,  no organomegaly Extremities: extremities normal, atraumatic, no cyanosis or edema Skin: Skin color, texture, turgor normal. No rashes or lesions Lymph nodes: Cervical, supraclavicular, and axillary nodes normal. No abnormal inguinal nodes palpated Neurologic: Grossly normal    Pelvic: External genitalia:  no lesions              Urethra:  normal appearing urethra with no masses, tenderness or lesions              Bartholins and Skenes: normal                 Vagina: normal appearing vagina with normal color and discharge, no lesions              Cervix: no lesions.  Mild blood flow.                Bimanual Exam:  Uterus:  normal size, contour, position, consistency, mobility, non-tender              Adnexa: no mass, fullness, tenderness           Chaperone was present for exam:  Sharee Pimple, RN   ASSESSMENT   Uterine fibroids.  Endometrial polyp. Left ovarian cyst.  HTN.  Borderline DM.    PLAN   Proceed with TLH, bilateral salpingectomy, left ovarian cystectomy, possible bilateral oophorectomy, cystoscopy.  Risks, benefits, and alternatives have been reviewed with the patient who wishes  to proceed.  The risk of conversion to a laparotomy incision has also been reviewed.   Check CBC today.  She will be out of work of work for 6 weeks.  Will get a copy of her mammogram.

## 2021-11-23 NOTE — Anesthesia Preprocedure Evaluation (Addendum)
Anesthesia Evaluation  Patient identified by MRN, date of birth, ID band Patient awake    Reviewed: Allergy & Precautions, NPO status , Patient's Chart, lab work & pertinent test results  Airway Mallampati: II  TM Distance: >3 FB     Dental   Pulmonary sleep apnea ,    breath sounds clear to auscultation       Cardiovascular hypertension,  Rhythm:Regular Rate:Normal     Neuro/Psych  Headaches,    GI/Hepatic Neg liver ROS, GERD  ,  Endo/Other  diabetes  Renal/GU      Musculoskeletal   Abdominal   Peds  Hematology   Anesthesia Other Findings   Reproductive/Obstetrics                            Anesthesia Physical Anesthesia Plan  ASA: 3  Anesthesia Plan: General   Post-op Pain Management:    Induction: Intravenous  PONV Risk Score and Plan: Ondansetron, Dexamethasone and Midazolam  Airway Management Planned: Oral ETT  Additional Equipment:   Intra-op Plan:   Post-operative Plan: Extubation in OR  Informed Consent: I have reviewed the patients History and Physical, chart, labs and discussed the procedure including the risks, benefits and alternatives for the proposed anesthesia with the patient or authorized representative who has indicated his/her understanding and acceptance.     Dental advisory given  Plan Discussed with: CRNA and Anesthesiologist  Anesthesia Plan Comments:        Anesthesia Quick Evaluation

## 2021-11-24 ENCOUNTER — Ambulatory Visit (HOSPITAL_BASED_OUTPATIENT_CLINIC_OR_DEPARTMENT_OTHER): Payer: BC Managed Care – PPO | Admitting: Anesthesiology

## 2021-11-24 ENCOUNTER — Encounter (HOSPITAL_BASED_OUTPATIENT_CLINIC_OR_DEPARTMENT_OTHER): Payer: Self-pay | Admitting: Obstetrics and Gynecology

## 2021-11-24 ENCOUNTER — Encounter (HOSPITAL_BASED_OUTPATIENT_CLINIC_OR_DEPARTMENT_OTHER)
Admission: RE | Disposition: A | Discharge status: Home / Self Care | Payer: Self-pay | Source: Home / Self Care | Attending: Obstetrics and Gynecology

## 2021-11-24 ENCOUNTER — Observation Stay (HOSPITAL_BASED_OUTPATIENT_CLINIC_OR_DEPARTMENT_OTHER)
Admission: RE | Admit: 2021-11-24 | Discharge: 2021-11-25 | Disposition: A | Discharge status: Home / Self Care | Payer: BC Managed Care – PPO | Attending: Obstetrics and Gynecology | Admitting: Obstetrics and Gynecology

## 2021-11-24 DIAGNOSIS — Z90710 Acquired absence of both cervix and uterus: Secondary | ICD-10-CM | POA: Diagnosis present

## 2021-11-24 DIAGNOSIS — N840 Polyp of corpus uteri: Secondary | ICD-10-CM | POA: Insufficient documentation

## 2021-11-24 DIAGNOSIS — I10 Essential (primary) hypertension: Secondary | ICD-10-CM | POA: Diagnosis not present

## 2021-11-24 DIAGNOSIS — B6799 Other echinococcosis: Secondary | ICD-10-CM | POA: Diagnosis not present

## 2021-11-24 DIAGNOSIS — N83202 Unspecified ovarian cyst, left side: Secondary | ICD-10-CM | POA: Diagnosis not present

## 2021-11-24 DIAGNOSIS — E119 Type 2 diabetes mellitus without complications: Secondary | ICD-10-CM | POA: Diagnosis not present

## 2021-11-24 DIAGNOSIS — Z79899 Other long term (current) drug therapy: Secondary | ICD-10-CM | POA: Diagnosis not present

## 2021-11-24 DIAGNOSIS — D259 Leiomyoma of uterus, unspecified: Principal | ICD-10-CM | POA: Insufficient documentation

## 2021-11-24 DIAGNOSIS — D219 Benign neoplasm of connective and other soft tissue, unspecified: Secondary | ICD-10-CM | POA: Diagnosis not present

## 2021-11-24 HISTORY — DX: Gastro-esophageal reflux disease without esophagitis: K21.9

## 2021-11-24 HISTORY — DX: Other hemorrhoids: K64.8

## 2021-11-24 HISTORY — DX: Headache, unspecified: R51.9

## 2021-11-24 HISTORY — PX: CYSTOSCOPY: SHX5120

## 2021-11-24 HISTORY — PX: TOTAL LAPAROSCOPIC HYSTERECTOMY WITH SALPINGECTOMY: SHX6742

## 2021-11-24 HISTORY — DX: Unspecified osteoarthritis, unspecified site: M19.90

## 2021-11-24 HISTORY — DX: Type 2 diabetes mellitus without complications: E11.9

## 2021-11-24 HISTORY — DX: Vitamin B deficiency, unspecified: E53.9

## 2021-11-24 LAB — TYPE AND SCREEN
ABO/RH(D): O POS
Antibody Screen: NEGATIVE

## 2021-11-24 LAB — BASIC METABOLIC PANEL
Anion gap: 10 (ref 5–15)
BUN: 8 mg/dL (ref 6–20)
CO2: 22 mmol/L (ref 22–32)
Calcium: 9.5 mg/dL (ref 8.9–10.3)
Chloride: 103 mmol/L (ref 98–111)
Creatinine, Ser: 0.78 mg/dL (ref 0.44–1.00)
GFR, Estimated: >60 mL/min (ref >60)
Glucose, Bld: 176 mg/dL — ABNORMAL HIGH (ref 70–99)
Potassium: 3.7 mmol/L (ref 3.5–5.1)
Sodium: 135 mmol/L (ref 135–145)

## 2021-11-24 LAB — GLUCOSE, CAPILLARY: Glucose-Capillary: 149 mg/dL — ABNORMAL HIGH (ref 70–99)

## 2021-11-24 LAB — CBC
HCT: 37.6 % (ref 36.0–46.0)
Hemoglobin: 12.8 g/dL (ref 12.0–15.0)
MCH: 26.9 pg (ref 26.0–34.0)
MCHC: 34 g/dL (ref 30.0–36.0)
MCV: 79.2 fL — ABNORMAL LOW (ref 80.0–100.0)
Platelets: 264 10*3/uL (ref 150–400)
RBC: 4.75 MIL/uL (ref 3.87–5.11)
RDW: 15.8 % — ABNORMAL HIGH (ref 11.5–15.5)
WBC: 13.6 10*3/uL — ABNORMAL HIGH (ref 4.0–10.5)
nRBC: 0 % (ref 0.0–0.2)

## 2021-11-24 LAB — POCT PREGNANCY, URINE: Preg Test, Ur: NEGATIVE

## 2021-11-24 LAB — ABO/RH: ABO/RH(D): O POS

## 2021-11-24 SURGERY — HYSTERECTOMY, TOTAL, LAPAROSCOPIC, WITH SALPINGECTOMY
Anesthesia: General | Site: Bladder

## 2021-11-24 MED ORDER — PROPOFOL 10 MG/ML IV BOLUS
INTRAVENOUS | Status: DC | PRN
  Administered 2021-11-24: 150 mg via INTRAVENOUS

## 2021-11-24 MED ORDER — IBUPROFEN 800 MG PO TABS: 800.0000 mg | ORAL_TABLET | Freq: Three times a day (TID) | ORAL | Status: DC | PRN

## 2021-11-24 MED ORDER — NEBIVOLOL HCL 5 MG PO TABS: 5.0000 mg | ORAL_TABLET | Freq: Every day | ORAL | Status: DC

## 2021-11-24 MED ORDER — FENTANYL CITRATE (PF) 100 MCG/2ML IJ SOLN: 25.0000 ug | INTRAMUSCULAR | Status: DC | PRN

## 2021-11-24 MED ORDER — METHENAMINE MANDELATE 0.5 G PO TABS
1.0000 g | ORAL_TABLET | Freq: Two times a day (BID) | ORAL | Status: DC
  Filled 2021-11-24: qty 2

## 2021-11-24 MED ORDER — LACTATED RINGERS IV SOLN: INTRAVENOUS | Status: DC

## 2021-11-24 MED ORDER — PANTOPRAZOLE SODIUM 40 MG PO TBEC: 80.0000 mg | DELAYED_RELEASE_TABLET | Freq: Every day | ORAL | Status: DC

## 2021-11-24 MED ORDER — GABAPENTIN 300 MG PO CAPS
ORAL_CAPSULE | ORAL | Status: AC
  Filled 2021-11-24: qty 1

## 2021-11-24 MED ORDER — SODIUM CHLORIDE 0.9 % IV SOLN
INTRAVENOUS | Status: AC
  Filled 2021-11-24: qty 2

## 2021-11-24 MED ORDER — MORPHINE SULFATE (PF) 4 MG/ML IV SOLN
1.0000 mg | INTRAVENOUS | Status: DC | PRN
  Administered 2021-11-24: 1 mg via INTRAVENOUS

## 2021-11-24 MED ORDER — ONDANSETRON HCL 4 MG PO TABS: 4.0000 mg | ORAL_TABLET | Freq: Four times a day (QID) | ORAL | Status: DC | PRN

## 2021-11-24 MED ORDER — LORATADINE 10 MG PO TABS
ORAL_TABLET | ORAL | Status: AC
  Filled 2021-11-24: qty 1

## 2021-11-24 MED ORDER — ROCURONIUM BROMIDE 10 MG/ML (PF) SYRINGE
PREFILLED_SYRINGE | INTRAVENOUS | Status: DC | PRN
  Administered 2021-11-24: 60 mg via INTRAVENOUS
  Administered 2021-11-24 (×2): 20 mg via INTRAVENOUS

## 2021-11-24 MED ORDER — STERILE WATER FOR IRRIGATION IR SOLN
Status: DC | PRN
  Administered 2021-11-24: 3000 mL via INTRAVESICAL

## 2021-11-24 MED ORDER — DEXAMETHASONE SODIUM PHOSPHATE 4 MG/ML IJ SOLN
INTRAMUSCULAR | Status: DC | PRN
  Administered 2021-11-24: 5 mg via INTRAVENOUS

## 2021-11-24 MED ORDER — ONDANSETRON HCL 4 MG/2ML IJ SOLN
INTRAMUSCULAR | Status: AC
  Filled 2021-11-24: qty 2

## 2021-11-24 MED ORDER — ROSUVASTATIN CALCIUM 10 MG PO TABS: 10.0000 mg | ORAL_TABLET | Freq: Every day | ORAL | Status: DC

## 2021-11-24 MED ORDER — MIDAZOLAM HCL 5 MG/5ML IJ SOLN
INTRAMUSCULAR | Status: DC | PRN
  Administered 2021-11-24: 2 mg via INTRAVENOUS

## 2021-11-24 MED ORDER — SODIUM CHLORIDE 0.9 % IR SOLN
Status: DC | PRN
  Administered 2021-11-24: 1000 mL

## 2021-11-24 MED ORDER — MORPHINE SULFATE (PF) 4 MG/ML IV SOLN
INTRAVENOUS | Status: AC
  Filled 2021-11-24: qty 1

## 2021-11-24 MED ORDER — OXYCODONE HCL 5 MG PO TABS
ORAL_TABLET | ORAL | Status: AC
  Filled 2021-11-24: qty 1

## 2021-11-24 MED ORDER — ESMOLOL HCL 100 MG/10ML IV SOLN
INTRAVENOUS | Status: DC | PRN
  Administered 2021-11-24: 10 mg via INTRAVENOUS
  Administered 2021-11-24: 20 mg via INTRAVENOUS

## 2021-11-24 MED ORDER — OXYCODONE HCL 5 MG PO TABS
ORAL_TABLET | ORAL | Status: AC
  Filled 2021-11-24: qty 2

## 2021-11-24 MED ORDER — ACETAMINOPHEN 500 MG PO TABS
1000.0000 mg | ORAL_TABLET | ORAL | Status: AC
  Administered 2021-11-24: 1000 mg via ORAL

## 2021-11-24 MED ORDER — ONDANSETRON HCL 4 MG/2ML IJ SOLN
4.0000 mg | Freq: Four times a day (QID) | INTRAMUSCULAR | Status: DC | PRN
  Administered 2021-11-24: 4 mg via INTRAVENOUS

## 2021-11-24 MED ORDER — KETOROLAC TROMETHAMINE 30 MG/ML IJ SOLN
INTRAMUSCULAR | Status: AC
  Filled 2021-11-24: qty 1

## 2021-11-24 MED ORDER — OXYCODONE HCL 5 MG PO TABS
5.0000 mg | ORAL_TABLET | ORAL | Status: DC | PRN
  Administered 2021-11-24: 5 mg via ORAL
  Administered 2021-11-24 (×2): 10 mg via ORAL
  Administered 2021-11-24: 5 mg via ORAL
  Administered 2021-11-25: 10 mg via ORAL

## 2021-11-24 MED ORDER — LEVOCETIRIZINE DIHYDROCHLORIDE 5 MG PO TABS: 5.0000 mg | ORAL_TABLET | Freq: Every evening | ORAL | Status: DC

## 2021-11-24 MED ORDER — ACETAMINOPHEN 500 MG PO TABS
ORAL_TABLET | ORAL | Status: AC
  Filled 2021-11-24: qty 2

## 2021-11-24 MED ORDER — SPIRONOLACTONE 25 MG PO TABS
25.0000 mg | ORAL_TABLET | Freq: Every day | ORAL | Status: DC
  Administered 2021-11-24: 25 mg via ORAL
  Filled 2021-11-24: qty 1

## 2021-11-24 MED ORDER — SODIUM CHLORIDE 0.9 % IV SOLN
2.0000 g | INTRAVENOUS | Status: AC
  Administered 2021-11-24: 2 g via INTRAVENOUS

## 2021-11-24 MED ORDER — LIDOCAINE 2% (20 MG/ML) 5 ML SYRINGE
INTRAMUSCULAR | Status: DC | PRN
Start: 2021-11-24 — End: 2021-11-24
  Administered 2021-11-24: 60 mg via INTRAVENOUS

## 2021-11-24 MED ORDER — ENOXAPARIN SODIUM 40 MG/0.4ML IJ SOSY
40.0000 mg | PREFILLED_SYRINGE | INTRAMUSCULAR | Status: AC
  Administered 2021-11-24: 40 mg via SUBCUTANEOUS

## 2021-11-24 MED ORDER — GABAPENTIN 300 MG PO CAPS
300.0000 mg | ORAL_CAPSULE | ORAL | Status: AC
  Administered 2021-11-24: 300 mg via ORAL

## 2021-11-24 MED ORDER — ENOXAPARIN SODIUM 40 MG/0.4ML IJ SOSY
PREFILLED_SYRINGE | INTRAMUSCULAR | Status: AC
  Filled 2021-11-24: qty 0.4

## 2021-11-24 MED ORDER — DEXMEDETOMIDINE (PRECEDEX) IN NS 20 MCG/5ML (4 MCG/ML) IV SYRINGE
PREFILLED_SYRINGE | INTRAVENOUS | Status: DC | PRN
  Administered 2021-11-24: 8 ug via INTRAVENOUS
  Administered 2021-11-24: 12 ug via INTRAVENOUS

## 2021-11-24 MED ORDER — FENTANYL CITRATE (PF) 100 MCG/2ML IJ SOLN
INTRAMUSCULAR | Status: DC | PRN
  Administered 2021-11-24: 25 ug via INTRAVENOUS
  Administered 2021-11-24: 100 ug via INTRAVENOUS
  Administered 2021-11-24: 25 ug via INTRAVENOUS
  Administered 2021-11-24: 100 ug via INTRAVENOUS
  Administered 2021-11-24 (×2): 50 ug via INTRAVENOUS

## 2021-11-24 MED ORDER — POVIDONE-IODINE 10 % EX SWAB: 2.0000 "application " | Freq: Once | CUTANEOUS | Status: DC

## 2021-11-24 MED ORDER — SODIUM CHLORIDE 0.9 % IV SOLN
INTRAVENOUS | Status: DC | PRN
  Administered 2021-11-24: 60 mL

## 2021-11-24 MED ORDER — SUGAMMADEX SODIUM 200 MG/2ML IV SOLN
INTRAVENOUS | Status: DC | PRN
  Administered 2021-11-24: 200 mg via INTRAVENOUS

## 2021-11-24 MED ORDER — RIMEGEPANT SULFATE 75 MG PO TBDP
1.0000 | ORAL_TABLET | ORAL | Status: DC | PRN
Start: 2021-11-24 — End: 2021-11-25

## 2021-11-24 MED ORDER — MENTHOL 3 MG MT LOZG: 1.0000 | LOZENGE | OROMUCOSAL | Status: DC | PRN

## 2021-11-24 MED ORDER — ONDANSETRON HCL 4 MG/2ML IJ SOLN
INTRAMUSCULAR | Status: DC | PRN
  Administered 2021-11-24: 4 mg via INTRAVENOUS

## 2021-11-24 MED ORDER — KETOROLAC TROMETHAMINE 30 MG/ML IJ SOLN
30.0000 mg | Freq: Four times a day (QID) | INTRAMUSCULAR | Status: DC
  Administered 2021-11-24 – 2021-11-25 (×4): 30 mg via INTRAVENOUS

## 2021-11-24 MED ORDER — LORATADINE 10 MG PO TABS
10.0000 mg | ORAL_TABLET | Freq: Every day | ORAL | Status: DC
  Administered 2021-11-24: 10 mg via ORAL

## 2021-11-24 MED ORDER — BUPIVACAINE HCL (PF) 0.25 % IJ SOLN
INTRAMUSCULAR | Status: DC | PRN
  Administered 2021-11-24: 10 mL

## 2021-11-24 SURGICAL SUPPLY — 66 items
ADH SKN CLS APL DERMABOND .7 (GAUZE/BANDAGES/DRESSINGS) ×3
APL ESCP 73.6OZ SRGCL (TIP) ×3
APL SRG 38 LTWT LNG FL B (MISCELLANEOUS)
APPLICATOR ARISTA FLEXITIP XL (MISCELLANEOUS) IMPLANT
BARRIER ADHS 3X4 INTERCEED (GAUZE/BANDAGES/DRESSINGS) IMPLANT
BRR ADH 4X3 ABS CNTRL BYND (GAUZE/BANDAGES/DRESSINGS)
CABLE HIGH FREQUENCY MONO STRZ (ELECTRODE) IMPLANT
CELL SAVER LIPIGURD (MISCELLANEOUS) IMPLANT
COVER BACK TABLE 60X90IN (DRAPES) ×4 IMPLANT
COVER MAYO STAND STRL (DRAPES) ×4 IMPLANT
DECANTER SPIKE VIAL GLASS SM (MISCELLANEOUS) ×8 IMPLANT
DERMABOND ADVANCED (GAUZE/BANDAGES/DRESSINGS) ×1
DERMABOND ADVANCED .7 DNX12 (GAUZE/BANDAGES/DRESSINGS) ×3 IMPLANT
DRAPE INCISE IOBAN 66X45 STRL (DRAPES) ×4 IMPLANT
DURAPREP 26ML APPLICATOR (WOUND CARE) ×4 IMPLANT
EXTRT SYSTEM ALEXIS 14CM (MISCELLANEOUS)
EXTRT SYSTEM ALEXIS 17CM (MISCELLANEOUS)
GAUZE 4X4 16PLY ~~LOC~~+RFID DBL (SPONGE) ×8 IMPLANT
GLOVE SURG ENC MOIS LTX SZ6.5 (GLOVE) ×8 IMPLANT
GLOVE SURG UNDER POLY LF SZ7 (GLOVE) ×4 IMPLANT
GOWN STRL REUS W/TWL LRG LVL3 (GOWN DISPOSABLE) ×16 IMPLANT
HEMOSTAT ARISTA ABSORB 3G PWDR (HEMOSTASIS) IMPLANT
HOLDER FOLEY CATH W/STRAP (MISCELLANEOUS) IMPLANT
KIT TURNOVER CYSTO (KITS) ×4 IMPLANT
LIGASURE VESSEL 5MM BLUNT TIP (ELECTROSURGICAL) ×4 IMPLANT
NEEDLE INSUFFLATION 120MM (ENDOMECHANICALS) ×4 IMPLANT
OCCLUDER COLPOPNEUMO (BALLOONS) ×4 IMPLANT
PACK LAPAROSCOPY BASIN (CUSTOM PROCEDURE TRAY) ×4 IMPLANT
PACK TRENDGUARD 450 HYBRID PRO (MISCELLANEOUS) IMPLANT
POUCH LAPAROSCOPIC INSTRUMENT (MISCELLANEOUS) ×4 IMPLANT
POWDER SURGICEL 3.0 GRAM (HEMOSTASIS) ×4 IMPLANT
PROTECTOR NERVE ULNAR (MISCELLANEOUS) ×4 IMPLANT
RETRACTOR WOUND ALXS 19CM XSML (INSTRUMENTS) IMPLANT
RTRCTR WOUND ALEXIS 19CM XSML (INSTRUMENTS)
SCISSORS LAP 5X35 DISP (ENDOMECHANICALS) IMPLANT
SET IRRIG Y TYPE TUR BLADDER L (SET/KITS/TRAYS/PACK) ×4 IMPLANT
SET SUCTION IRRIG HYDROSURG (IRRIGATION / IRRIGATOR) ×4 IMPLANT
SET TRI-LUMEN FLTR TB AIRSEAL (TUBING) ×4 IMPLANT
SHEARS 1100 HARMONIC 36 (ELECTROSURGICAL) IMPLANT
SHEARS HARMONIC ACE PLUS 36CM (ENDOMECHANICALS) ×4 IMPLANT
SLEEVE ADV FIXATION 5X100MM (TROCAR) ×4 IMPLANT
SUT VIC AB 0 CT1 27 (SUTURE) ×8
SUT VIC AB 0 CT1 27XBRD ANBCTR (SUTURE) ×6 IMPLANT
SUT VIC AB 4-0 PS2 18 (SUTURE) ×4 IMPLANT
SUT VICRYL 0 UR6 27IN ABS (SUTURE) IMPLANT
SUT VLOC 180 0 9IN  GS21 (SUTURE) ×4
SUT VLOC 180 0 9IN GS21 (SUTURE) ×3 IMPLANT
SYR 10ML LL (SYRINGE) ×4 IMPLANT
SYR 50ML LL SCALE MARK (SYRINGE) ×8 IMPLANT
SYSTEM CARTER THOMASON II (TROCAR) IMPLANT
SYSTEM CONTND EXTRCTN KII BLLN (MISCELLANEOUS) IMPLANT
TIP ENDOSCOPIC SURGICEL (TIP) ×4 IMPLANT
TIP RUMI ORANGE 6.7MMX12CM (TIP) IMPLANT
TIP UTERINE 5.1X6CM LAV DISP (MISCELLANEOUS) IMPLANT
TIP UTERINE 6.7X10CM GRN DISP (MISCELLANEOUS) ×4 IMPLANT
TIP UTERINE 6.7X6CM WHT DISP (MISCELLANEOUS) IMPLANT
TIP UTERINE 6.7X8CM BLUE DISP (MISCELLANEOUS) IMPLANT
TOWEL OR 17X26 10 PK STRL BLUE (TOWEL DISPOSABLE) ×8 IMPLANT
TRAY FOLEY W/BAG SLVR 14FR LF (SET/KITS/TRAYS/PACK) ×4 IMPLANT
TRENDGUARD 450 HYBRID PRO PACK (MISCELLANEOUS)
TROCAR ADV FIXATION 5X100MM (TROCAR) ×4 IMPLANT
TROCAR BLADELESS OPT 5 100 (ENDOMECHANICALS) ×4 IMPLANT
TROCAR PORT AIRSEAL 5X120 (TROCAR) ×4 IMPLANT
TROCAR PORT AIRSEAL 8X120 (TROCAR) IMPLANT
TROCAR XCEL NON BLADE 8MM B8LT (ENDOMECHANICALS) ×4 IMPLANT
WARMER LAPAROSCOPE (MISCELLANEOUS) ×4 IMPLANT

## 2021-11-24 NOTE — Transfer of Care (Signed)
Immediate Anesthesia Transfer of Care Note  Patient: Samantha Ball  Procedure(s) Performed: TOTAL LAPAROSCOPIC HYSTERECTOMY WITHBILATERAL SALPINGECTOMY, (Bilateral: Abdomen) CYSTOSCOPY (Bladder)  Patient Location: PACU  Anesthesia Type:General  Level of Consciousness: awake  Airway & Oxygen Therapy: Patient Spontanous Breathing and Patient connected to face mask oxygen  Post-op Assessment: Report given to RN and Post -op Vital signs reviewed and stable  Post vital signs: Reviewed and stable  Last Vitals:  Vitals Value Taken Time  BP 152/99 11/24/21 1008  Temp    Pulse 82 11/24/21 1013  Resp 17 11/24/21 1012  SpO2 100 % 11/24/21 1013  Vitals shown include unvalidated device data.  Last Pain:  Vitals:   11/24/21 0620  TempSrc: Oral  PainSc: 0-No pain      Patients Stated Pain Goal: 7 (45/14/60 4799)  Complications: No notable events documented.

## 2021-11-24 NOTE — Progress Notes (Signed)
Update to History and Physical  No marked change in status since office pre-op visit.   Patient examined.   OK to proceed with surgery. 

## 2021-11-24 NOTE — Anesthesia Postprocedure Evaluation (Signed)
Anesthesia Post Note  Patient: Samantha Ball  Procedure(s) Performed: TOTAL LAPAROSCOPIC HYSTERECTOMY WITHBILATERAL SALPINGECTOMY, (Bilateral: Abdomen) CYSTOSCOPY (Bladder)     Patient location during evaluation: PACU Anesthesia Type: General Level of consciousness: awake Pain management: pain level controlled Vital Signs Assessment: post-procedure vital signs reviewed and stable Respiratory status: spontaneous breathing Postop Assessment: no apparent nausea or vomiting Anesthetic complications: no   No notable events documented.  Last Vitals:  Vitals:   11/24/21 1015 11/24/21 1030  BP: (!) 142/117 (!) 156/99  Pulse: 80 81  Resp: (!) 21 (!) 22  Temp:    SpO2: 100% 95%    Last Pain:  Vitals:   11/24/21 1030  TempSrc:   PainSc: Asleep                 Sheryle Vice

## 2021-11-24 NOTE — Progress Notes (Signed)
Day of Surgery Procedure(s) (LRB): TOTAL LAPAROSCOPIC HYSTERECTOMY WITHBILATERAL SALPINGECTOMY, (Bilateral) CYSTOSCOPY (N/A)  Subjective: Patient reports some difficulty emptying her bladder.  Nurse reports multiple voids of good volume.  Had an ultrasound PVR check following last void, which was 260 - 300 cc.  Tolerating liquids and crackers.  Ambulating.  Received Morphine IV once since in leaving PACU.  Taking Percocet and receiving Toradol.   Husband present at bedside.   Objective: I have reviewed patient's vital signs, intake and output, and labs. Vitals:   11/24/21 1215 11/24/21 1640  BP: (!) 150/92 (!) 149/90  Pulse: 84 94  Resp: 16 18  Temp:  99.1 F (37.3 C)  SpO2: 97% 96%   I/O - 1239cc/1718 cc  CBC    Component Value Date/Time   WBC 13.6 (H) 11/24/2021 1538   RBC 4.75 11/24/2021 1538   HGB 12.8 11/24/2021 1538   HGB 12.4 03/06/2018 1353   HCT 37.6 11/24/2021 1538   HCT 37.5 03/06/2018 1353   PLT 264 11/24/2021 1538   PLT 311 03/06/2018 1353   MCV 79.2 (L) 11/24/2021 1538   MCV 74 (L) 03/06/2018 1353   MCH 26.9 11/24/2021 1538   MCHC 34.0 11/24/2021 1538   RDW 15.8 (H) 11/24/2021 1538   RDW 21.0 (H) 03/06/2018 1353   LYMPHSABS 2.5 09/17/2021 1153   LYMPHSABS 3.0 03/06/2018 1353   MONOABS 0.6 09/17/2021 1153   EOSABS 0.1 09/17/2021 1153   EOSABS 0.1 03/06/2018 1353   BASOSABS 0.1 09/17/2021 1153   BASOSABS 0.0 03/06/2018 1353   Glucose 176  General: alert, cooperative, and tearful Resp: clear to auscultation bilaterally Cardio: regular rate and rhythm, S1, S2 normal, no murmur, click, rub or gallop GI: soft, non-tender; bowel sounds normal; no masses,  no organomegaly and incision: clean, dry, and intact Extremities: PAS on.  DPs 2+ bilaterally.  Vaginal Bleeding: none  Assessment: s/p Procedure(s): TOTAL LAPAROSCOPIC HYSTERECTOMY WITHBILATERAL SALPINGECTOMY, (Bilateral) CYSTOSCOPY (N/A): progressing well  Plan: Advance diet Encourage  ambulation Advance to PO medication Continue in hospital care.  If PVR >400 cc, will place Foley overnight.  Surgical findings and procedure reviewed.  CBC with differential and BMP in am.   LOS: 0 days    Arloa Koh 11/24/2021, 5:57 PM

## 2021-11-24 NOTE — Anesthesia Procedure Notes (Signed)
Procedure Name: Intubation Date/Time: 11/24/2021 7:38 AM Performed by: Lieutenant Diego, CRNA Pre-anesthesia Checklist: Patient identified, Emergency Drugs available, Suction available and Patient being monitored Patient Re-evaluated:Patient Re-evaluated prior to induction Oxygen Delivery Method: Circle system utilized Preoxygenation: Pre-oxygenation with 100% oxygen Induction Type: IV induction Ventilation: Mask ventilation without difficulty Laryngoscope Size: Miller and 2 Grade View: Grade I Tube type: Oral Tube size: 7.0 mm Number of attempts: 1 Airway Equipment and Method: Stylet and Oral airway Placement Confirmation: ETT inserted through vocal cords under direct vision, positive ETCO2 and breath sounds checked- equal and bilateral Secured at: 23 cm Tube secured with: Tape Dental Injury: Teeth and Oropharynx as per pre-operative assessment

## 2021-11-24 NOTE — Op Note (Addendum)
OPERATIVE REPORT   PREOPERATIVE DIAGNOSIS:   Uterine fibroids, endometrial polyp, left ovarian cyst.    POSTOPERATIVE DIAGNOSIS:  Uterine fibroids, endometrial polyp   PROCEDURES:    Total laparoscopic hysterectomy with bilateral salpingectomy, cystoscopy   SURGEON:    Lenard Galloway, M.D.   ASSISTANT:    Dorothy Spark, M.D.   ANESTHESIA:  General endotracheal, intraperitoneal ropivicaine 30 mL diluted in 30 mL of normal saline, local with 0.25% Marcaine.   IVF:   800 cc LR   ESTIMATED BLOOD LOSS:   50 cc   URINE OUTPUT:   431 cc   COMPLICATIONS:  None.   INDICATIONS FOR THE PROCEDURE:      The patient is a 45 year old G3 P2 African American female, status post bilateral tubal ligation, who presents with menorrhagia with regular menstruation and findings of uterine fibroids, a complex left ovarian cyst, and a known endometrial polyp on prior biopsy.  The patient declines medical therapy and future childbearing, and she is requesting definitive therapy with hysterectomy.   A plan is made to proceed with a total laparoscopic hysterectomy with bilateral salpingectomy, left ovarian cystectomy, possible bilateral oophorectomy, and cystoscopy after risks, benefits, and alternatives are reviewed.   FINDINGS:      Exam under anesthesia reveals a 10 week size uterus.  No adnexal masses were appreciated.   Laparoscopy revealed a fibroid uterus with a 3 cm left posterior supracervical fibroid.  Her ovaries were normal.  The left fallopian tubes were consistent with prior tubal ligation.  There was a 1 cm left Hydatid cyst of the fallopian tube.  The liver was normal and the gallbladder appeared distended.  The appendix was was not visualized. There was no adhesive disease in the abdomen or pelvis.     Cystoscopy at the termination of the procedure showed the bladder to be normal throughout 360 degrees including the bladder dome and trigone. There was no evidence of any foreign body in the  bladder or the urethra. There was no evidence of any lesions  of the bladder or the urethra.   Both of the ureters were noted to be patent bilaterally.   SPECIMENS:      The uterus, cervix, and bilateral tubes were went to pathology.  The uterine weight was 322 grams.   DESCRIPTION OF PROCEDURE:     The patient was reidentified in the preoperative hold area.   She did receive Cefotetan IV for antibiotic prophylaxis.  She received Lovenox, TED hose, and PAS stockings for DVT prophylaxis.   In the operating room, the patient was placed in the dorsal lithotomy position on the operating room table.   Her legs were placed in the Worthington stirrups and her arms were both tucked at her sides. The Trendguard was used to support her shoulders.  The patient received general endotracheal anesthesia. The abdomen and vagina were then sterilely prepped and she was sterilely draped.   A speculum was placed in the vagina and a single-tooth tenaculum was placed on the anterior cervical lip.  A figure-of-eight suture of 0 Vicryl was placed on each the anterior and the posterior cervical lips. The uterus was sounded to 10 cm.  The cervix was then dilated with [facility] dilators.  A #10  RUMI tip with a KOH ring was then placed through the cervix and into the uterine cavity without difficulty.  The remaining vaginal instruments were then removed.  A Foley catheter was placed inside the bladder.   Attention  was turned to the abdomen where the umbilical region was injected with 0.25% Marcaine and a small incision created.  A Veress needle was then used to insufflate the abdomen with CO2 gas after a saline drop test was performed and the fluid flowed freely.  A 5 mm umbilical incision was created with a scalpel after the skin.  A 5 mm camera port was then placed using the Optiview.  An 8 mm incision was placed in the left mid abdomen and a 5 mm incision was placed in the left lower abdomen after the skin was injected locally with  0.25% Marcaine.  Respective sized trocars were then placed under visualization of the laparoscope.  A 5 mm Airseal trocar was placed in the right lower quadrant after injecting with Marcaine and incising with a scalpel.    Ropivicine 30 cc diluted in 30 cc of normal saline was placed inside the peritoneal cavity.    The patient was placed in Trendelenburg position.  An inspection of the abdomen and pelvis was performed. The findings are as noted above.    The bilateral ureters were identified.  The left fallopian tube was grasped and the Ligasure was used to cauterize and cut through the mesosalpinx to the level of the uterus.  The fallopian tube was cauterized and bisected proximally and removed from the peritoneal cavity.  It was then sent to Pathology. The left utero-ovarian ligament was similarly cauterized and cut with the Ligasure.  The left round ligament was then cauterized and divided with same instrument.  Dissection was performed on the anterior and posterior leaves of the broad ligaments using the Harmonic scalpel.  The incision was carried across the anterior cul- de-sac along the vesicouterine fold and the bladder was dissected away from the cervix. The peritoneum was taken down over the left supracervical fibroid.  The left uterine artery was skeletonized at this time using the Harmonic scalpel.  It was then cauterized and cut with the Ligasure instrument.   The same procedure that was performed on the left side was repeated on the right side with respect to the isolation, cautery, and transection of the vessels and the bladder flap dissection.   The right fallopian tube was similarly removed and sent to Pathology.    The KOH ring was nicely visible.  The colpotomy incision was performed with the Harmonic scalpel in a circumferential fashion. The specimen was then removed from the peritoneal cavity through the vagina and was sent to Pathology.  The balloon occluder was placed in the vagina.   The vaginal cuff was sutured using a running suture of 0 V-Loc.  The vagina was closed from the patient's right hand side to the left hand side and then back 2 sutures towards the midline.  This provided good full-thickness closure of the vaginal cuff.  The laparoscopic needle for suturing was removed from the peritoneal cavity.   The pelvis was irrigated and suctioned.   The pneumoperitoneal pressure was decreased to 8.  There was some mild bleeding of the peritoneum over the bladder and this was cauterized with the Ligasure instrument.  There was good hemostasis of the operative sites and pedicles.    Surgicel powder was placed over the surgical field and rinsed away with irrigation and suction after one minute.    The CO2 pneumoperitoneum was released.  The patient received manual breaths to remove any remaining CO2 gas and all trocars were removed.  The trocar sites were closed with subcuticular sutures of  4/0 Vicryl.  Dermabond was placed over all of the incisions.   The vaginal occluder balloon was removed from the vagina.    The patient's Foley catheter was removed and cystoscopy was performed and the findings are as noted above.  The Foley catheter was left out.    Final inspection of the vagina demonstrated good hemostasis of the vaginal cuff.      This concluded the patient's procedure.  She was extubated and escorted to the recovery room in stable and awake condition.  There were no complications to the procedure.  All needle, instrument, and sponge counts were correct.  An MD assistant was necessary for instrumentation, retraction of tissues, and positioning of the patient due to the complexity of the case.    Lenard Galloway, M.D.

## 2021-11-25 DIAGNOSIS — I10 Essential (primary) hypertension: Secondary | ICD-10-CM | POA: Diagnosis not present

## 2021-11-25 DIAGNOSIS — E119 Type 2 diabetes mellitus without complications: Secondary | ICD-10-CM | POA: Diagnosis not present

## 2021-11-25 DIAGNOSIS — Z79899 Other long term (current) drug therapy: Secondary | ICD-10-CM | POA: Diagnosis not present

## 2021-11-25 DIAGNOSIS — D259 Leiomyoma of uterus, unspecified: Secondary | ICD-10-CM | POA: Diagnosis not present

## 2021-11-25 DIAGNOSIS — N83202 Unspecified ovarian cyst, left side: Secondary | ICD-10-CM | POA: Diagnosis not present

## 2021-11-25 DIAGNOSIS — N840 Polyp of corpus uteri: Secondary | ICD-10-CM | POA: Diagnosis not present

## 2021-11-25 LAB — BASIC METABOLIC PANEL
Anion gap: 8 (ref 5–15)
BUN: 10 mg/dL (ref 6–20)
CO2: 23 mmol/L (ref 22–32)
Calcium: 9.3 mg/dL (ref 8.9–10.3)
Chloride: 104 mmol/L (ref 98–111)
Creatinine, Ser: 0.78 mg/dL (ref 0.44–1.00)
GFR, Estimated: >60 mL/min (ref >60)
Glucose, Bld: 132 mg/dL — ABNORMAL HIGH (ref 70–99)
Potassium: 3.7 mmol/L (ref 3.5–5.1)
Sodium: 135 mmol/L (ref 135–145)

## 2021-11-25 LAB — CBC WITH DIFFERENTIAL/PLATELET
Abs Immature Granulocytes: 0.11 10*3/uL — ABNORMAL HIGH (ref 0.00–0.07)
Basophils Absolute: 0 10*3/uL (ref 0.0–0.1)
Basophils Relative: 0 %
Eosinophils Absolute: 0.1 10*3/uL (ref 0.0–0.5)
Eosinophils Relative: 1 %
HCT: 34.5 % — ABNORMAL LOW (ref 36.0–46.0)
Hemoglobin: 12 g/dL (ref 12.0–15.0)
Immature Granulocytes: 1 %
Lymphocytes Relative: 10 %
Lymphs Abs: 1.6 10*3/uL (ref 0.7–4.0)
MCH: 27.8 pg (ref 26.0–34.0)
MCHC: 34.8 g/dL (ref 30.0–36.0)
MCV: 80 fL (ref 80.0–100.0)
Monocytes Absolute: 1.4 10*3/uL — ABNORMAL HIGH (ref 0.1–1.0)
Monocytes Relative: 9 %
Neutro Abs: 11.9 10*3/uL — ABNORMAL HIGH (ref 1.7–7.7)
Neutrophils Relative %: 79 %
Platelets: 268 10*3/uL (ref 150–400)
RBC: 4.31 MIL/uL (ref 3.87–5.11)
RDW: 16 % — ABNORMAL HIGH (ref 11.5–15.5)
WBC: 15.1 10*3/uL — ABNORMAL HIGH (ref 4.0–10.5)
nRBC: 0 % (ref 0.0–0.2)

## 2021-11-25 MED ORDER — DIPHENHYDRAMINE HCL 25 MG PO CAPS
ORAL_CAPSULE | ORAL | Status: AC
  Filled 2021-11-25: qty 1

## 2021-11-25 MED ORDER — DIPHENHYDRAMINE HCL 25 MG PO CAPS
25.0000 mg | ORAL_CAPSULE | Freq: Four times a day (QID) | ORAL | Status: DC | PRN
  Administered 2021-11-25: 25 mg via ORAL

## 2021-11-25 MED ORDER — OXYCODONE HCL 5 MG PO TABS
ORAL_TABLET | ORAL | Status: AC
  Filled 2021-11-25: qty 2

## 2021-11-25 MED ORDER — KETOROLAC TROMETHAMINE 30 MG/ML IJ SOLN
INTRAMUSCULAR | Status: AC
  Filled 2021-11-25: qty 1

## 2021-11-25 MED ORDER — IBUPROFEN 800 MG PO TABS: 800.0000 mg | ORAL_TABLET | Freq: Three times a day (TID) | ORAL | 0 refills | Status: DC | PRN

## 2021-11-25 MED ORDER — HYDROCODONE-ACETAMINOPHEN 5-325 MG PO TABS: 1.0000 | ORAL_TABLET | Freq: Four times a day (QID) | ORAL | 0 refills | Status: DC | PRN

## 2021-11-25 NOTE — Progress Notes (Signed)
1 Day Post-Op Procedure(s) (LRB): TOTAL LAPAROSCOPIC HYSTERECTOMY WITHBILATERAL SALPINGECTOMY, (Bilateral) CYSTOSCOPY (N/A)  Subjective: Patient reports + flatus.   Voiding often.  No catheter overnight.  Tolerating regular diet.  Had a migraine last night with nausea.  Did not receive medication for this.  Reports itching today.  Using Oxy IR for pain control.  Denies congestion.  Sore throat following intubation is improved.   Objective: I have reviewed patient's vital signs, intake and output, and labs. Vitals:   11/25/21 0215 11/25/21 0630  BP: 115/74 112/79  Pulse: 85 79  Resp: 20 20  Temp: 98.7 F (37.1 C) 98.3 F (36.8 C)  SpO2: 94% 96%   I/O - 3148 cc/3939 cc  CBC    Component Value Date/Time   WBC 15.1 (H) 11/25/2021 0214   RBC 4.31 11/25/2021 0214   HGB 12.0 11/25/2021 0214   HGB 12.4 03/06/2018 1353   HCT 34.5 (L) 11/25/2021 0214   HCT 37.5 03/06/2018 1353   PLT 268 11/25/2021 0214   PLT 311 03/06/2018 1353   MCV 80.0 11/25/2021 0214   MCV 74 (L) 03/06/2018 1353   MCH 27.8 11/25/2021 0214   MCHC 34.8 11/25/2021 0214   RDW 16.0 (H) 11/25/2021 0214   RDW 21.0 (H) 03/06/2018 1353   LYMPHSABS 1.6 11/25/2021 0214   LYMPHSABS 3.0 03/06/2018 1353   MONOABS 1.4 (H) 11/25/2021 0214   EOSABS 0.1 11/25/2021 0214   EOSABS 0.1 03/06/2018 1353   BASOSABS 0.0 11/25/2021 0214   BASOSABS 0.0 03/06/2018 1353   Glucose:  132  General: alert and cooperative Resp: clear to auscultation bilaterally Cardio: regular rate and rhythm, S1, S2 normal, no murmur, click, rub or gallop GI: soft, non-tender; bowel sounds normal; no masses,  no organomegaly and incision: clean, dry, intact, and ecchymoses around left lower incision Vaginal Bleeding: none  Assessment: s/p Procedure(s): TOTAL LAPAROSCOPIC HYSTERECTOMY WITHBILATERAL SALPINGECTOMY, (Bilateral) CYSTOSCOPY (N/A): progressing well Elevated WBC attributed to surgical dissection.  No focus for infection noted.    Plan: Discharge home Rx for Vicodin and Motrin.  Decreased activity but importance of ambulation discussed.  Post op instructions reviewed in verbal and written form.  Fu in 1 week.    LOS: 0 days    Arloa Koh 11/25/2021, 7:11 AM

## 2021-11-26 ENCOUNTER — Encounter (HOSPITAL_BASED_OUTPATIENT_CLINIC_OR_DEPARTMENT_OTHER): Payer: Self-pay | Admitting: Obstetrics and Gynecology

## 2021-11-27 ENCOUNTER — Other Ambulatory Visit: Payer: Self-pay | Admitting: Internal Medicine

## 2021-11-28 NOTE — Discharge Summary (Signed)
Physician Discharge Summary  Patient ID: Samantha Ball MRN: [id number] DOB/AGE: [birth year] 45 y.o.  Admit date: 11/24/2021 Discharge date: 11/25/21  Admission Diagnoses: Uterine fibroids Endometrial polyp Left ovarian cyst  Discharge Diagnoses:   Uterine fibroids  Endometrial polyp  Status post Total laparoscopic hysterectomy with bilateral salpingectomy, cystoscopy  Principal Problem:   Status post laparoscopic hysterectomy   Discharged Condition: good  Hospital Course: The patient was admitted on 11/24/21 for a total laparoscopic hysterectomy with bilateral salpingectomy, cystoscopy which were performed without complication while under general anesthesia.  She received morphine, Toradol, and Oxy IR for pain control initially.  She developed pruritis without a rash, and she was treated with Benadryl on post op day one.  Her Oxy IR was discontinued, and she was given a prescription for Vicodin and Motrin at discharge.  She was able to tolerate a regular diet and she had decreased appetite.  She ambulated independently and wore PAS and Ted hose for DVT prophylaxis while in bed.  She was given Lovenox preop.  Her foley catheter were removed at the termination of surgery and she voided often and good volumes.  She expressed the feeling that she was not emptying her bladder completely, and she had bladder scans for post void residuals of 300 cc and less, which were followed by repeat voiding within a sort time.  She was not catheterized post op.  On post op day one, she reported a migraine headache and nausea the night prior.The patient's vital signs remained stable and she demonstrated no signs of clinical infection during her hospitalization.  The patient's post op day one CBC showed a Hgb of 12.0 and a WBC of 15.1, which was attributed to surgical dissection.  She had essentially no vaginal bleeding, and her incisions demonstrated no signs of erythema or significant drainage.  Her left lower  abdominal incision developed some ecchymoses.   She was found to be in good condition and ready for discharge on post op day one.  Consults: none  Significant Diagnostic Studies: labs:  See Hospital Course  Treatments: surgery:   Total laparoscopic hysterectomy with bilateral salpingectomy, cystoscopy  Discharge Exam: Blood pressure 128/80, pulse 70, temperature 98.1 F (36.7 C), resp. rate 18, height 5\' 4"  (1.626 m), weight 103.2 kg, last menstrual period 11/04/2021, SpO2 98 %. General: alert and cooperative Resp: clear to auscultation bilaterally Cardio: regular rate and rhythm, S1, S2 normal, no murmur, click, rub or gallop GI: soft, non-tender; bowel sounds normal; no masses,  no organomegaly and incision: clean, dry, intact, and ecchymoses around left lower incision Vaginal Bleeding: none  Disposition: Discharge disposition: 01-Home or Self Care      Discharge instructions were reviewed in verbal and written form.  Allergies as of 11/25/2021       Reactions   Amlodipine    constipation        Medication List     STOP taking these medications    ferrous sulfate 325 (65 FE) MG tablet   naproxen 375 MG tablet Commonly known as: NAPROSYN       TAKE these medications    b complex vitamins capsule Take 1 capsule by mouth daily.   BIOTIN PO Take by mouth daily.   cholecalciferol 25 MCG (1000 UNIT) tablet Commonly known as: VITAMIN D3 daily.   esomeprazole 40 MG capsule Commonly known as: NEXIUM TAKE 1 CAPSULE BY MOUTH EVERY DAY   HYDROcodone-acetaminophen 5-325 MG tablet Commonly known as: NORCO/VICODIN Take 1 tablet by mouth every  6 (six) hours as needed for moderate pain.   ibuprofen 800 MG tablet Commonly known as: ADVIL Take 1 tablet (800 mg total) by mouth every 8 (eight) hours as needed for mild pain. Notes to patient: May take first dose at 2:30pm   levocetirizine 5 MG tablet Commonly known as: XYZAL TAKE 1 TABLET BY MOUTH EVERY DAY IN THE  EVENING   methenamine 1 g tablet Commonly known as: HIPREX Take 1 tablet (1 g total) by mouth 2 (two) times daily with a meal.   nebivolol 5 MG tablet Commonly known as: BYSTOLIC TAKE 1 TABLET BY MOUTH EVERY DAY   Nurtec 75 MG Tbdp Generic drug: Rimegepant Sulfate Take 1 tablet by mouth every other day. What changed:  when to take this reasons to take this   ONE-A-DAY WOMENS PO Take by mouth.   potassium chloride 10 MEQ tablet Commonly known as: KLOR-CON Take 1 tablet (10 mEq total) by mouth 3 (three) times daily.   spironolactone 25 MG tablet Commonly known as: ALDACTONE TAKE 1 TABLET BY MOUTH EVERY DAY   VITAMIN B-12 IJ Inject as directed every 30 (thirty) days.       ASK your doctor about these medications    rosuvastatin 10 MG tablet Commonly known as: CRESTOR TAKE 1 TABLET BY MOUTH EVERY DAY        Follow-up Information     [name], MD Follow up in 1 week(s).   Specialty: Obstetrics and Gynecology Contact information: [address] [place] [address] [phone]                 Signed: Arloa Koh 11/28/2021, 4:08 PM

## 2021-11-30 ENCOUNTER — Encounter: Payer: Self-pay | Admitting: Obstetrics and Gynecology

## 2021-11-30 LAB — SURGICAL PATHOLOGY

## 2021-12-04 ENCOUNTER — Other Ambulatory Visit: Payer: Self-pay

## 2021-12-04 ENCOUNTER — Encounter: Payer: Self-pay | Admitting: Obstetrics and Gynecology

## 2021-12-04 ENCOUNTER — Ambulatory Visit (INDEPENDENT_AMBULATORY_CARE_PROVIDER_SITE_OTHER): Payer: BC Managed Care – PPO | Admitting: Obstetrics and Gynecology

## 2021-12-04 VITALS — BP 118/82 | HR 83 | Ht 64.0 in | Wt 222.0 lb

## 2021-12-04 DIAGNOSIS — Z90710 Acquired absence of both cervix and uterus: Secondary | ICD-10-CM

## 2021-12-04 LAB — CBC WITH DIFFERENTIAL/PLATELET
Absolute Monocytes: 510 cells/uL (ref 200–950)
Basophils Absolute: 53 cells/uL (ref 0–200)
Basophils Relative: 0.7 %
Eosinophils Absolute: 120 cells/uL (ref 15–500)
Eosinophils Relative: 1.6 %
HCT: 40.8 % (ref 35.0–45.0)
Hemoglobin: 13.5 g/dL (ref 11.7–15.5)
Lymphs Abs: 2603 cells/uL (ref 850–3900)
MCH: 27.4 pg (ref 27.0–33.0)
MCHC: 33.1 g/dL (ref 32.0–36.0)
MCV: 82.8 fL (ref 80.0–100.0)
MPV: 11 fL (ref 7.5–12.5)
Monocytes Relative: 6.8 %
Neutro Abs: 4215 cells/uL (ref 1500–7800)
Neutrophils Relative %: 56.2 %
Platelets: 334 10*3/uL (ref 140–400)
RBC: 4.93 10*6/uL (ref 3.80–5.10)
RDW: 14.9 % (ref 11.0–15.0)
Total Lymphocyte: 34.7 %
WBC: 7.5 10*3/uL (ref 3.8–10.8)

## 2021-12-04 NOTE — Progress Notes (Signed)
GYNECOLOGY  VISIT   HPI: 45 y.o.   Married  [place] American  female   [phone] with Patient's last menstrual period was 11/04/2021.   here for 1 week status post [place] SALPINGECTOMY, (Bilateral: Abdomen) CYSTOSCOPY (Bladder).  Husband present for the visit today.  Not taking Vicodin.  Taking occasional ibuprofen.   Noticing a little blood with wiping.  Otherwise no bleeding.  No clotting.   Had a low grade temp to 99 F, which resolved.   Voiding well.  Bowel function has returned.   CBC post op showed leukocytosis to 15.1 on POD 1.   GYNECOLOGIC HISTORY: Patient's last menstrual period was 11/04/2021. Contraception: Tubal/Hyst Menopausal hormone therapy:  n/a Last mammogram:  10-02-20 Neg/BiRads2 Last pap smear:  09-08-21 Neg:Neg HR HPV, 01-23-13 Neg        OB History     Gravida  3   Para  2   Term      Preterm      AB  1   Living  2      SAB  1   IAB      Ectopic      Multiple      Live Births                 Patient Active Problem List   Diagnosis Date Noted   Status post laparoscopic hysterectomy 11/24/2021   Cervical cancer screening 05/13/2021   Type II diabetes mellitus with manifestations ([place]) 05/13/2021   Onychomycosis 04/27/2021   Sensorineural hearing loss 04/27/2021   Migraine without aura and without status migrainosus, not intractable 11/17/2020   Routine general medical examination at a health care facility 04/01/2020   Intrinsic eczema 04/01/2020   Vitamin D deficiency disease 11/21/2018   B12 deficiency 11/21/2018   Perennial allergic rhinitis 03/06/2018   Seasonal allergic rhinitis due to pollen 05/09/2017   Hyperlipidemia LDL goal <130 12/21/2016   Snoring 11/09/2016   Other iron deficiency anemias 12/17/2014   GERD (gastroesophageal reflux disease) 01/09/2014   Constipation 01/09/2014   Positive ANA (antinuclear antibody) 07/16/2013   Essential hypertension, benign 07/11/2013    Visit for screening mammogram 07/11/2013   Obesity (BMI 30.0-34.9) 07/11/2013    Past Medical History:  Diagnosis Date   Anemia    Arthritis    oa   DM Type 2    diet controlled   Fibroids    Dr. Charlesetta Garibaldi, per patient.    GERD (gastroesophageal reflux disease)    Hyperlipidemia    Hypertension    Internal hemorrhoid    Intrinsic eczema 04/01/2020   migraine    Obesity    Sleep apnea    not currently using CPAP did not tolerate cpap   Urticaria    Vitamin B deficiency    Wears glasses 11/17/2021   for reading   Wears hearing aid in both ears 11/17/2021    Past Surgical History:  Procedure Laterality Date   colonscopy     x 2 last done 2021 or 2019   CYSTOSCOPY N/A 11/24/2021   Procedure: CYSTOSCOPY;  Surgeon: [name], MD;  Location: [facility];  Service: Gynecology;  Laterality: N/A;   DILATION AND CURETTAGE OF UTERUS N/A 11/19/2000   TOTAL LAPAROSCOPIC HYSTERECTOMY WITH SALPINGECTOMY Bilateral 11/24/2021   Procedure: TOTAL LAPAROSCOPIC HYSTERECTOMY WITHBILATERAL SALPINGECTOMY,;  Surgeon: [name], MD;  Location: [facility];  Service: Gynecology;  Laterality: Bilateral;   TUBAL  LIGATION  2004   wisdom teeth  12/20/1998    Current Outpatient Medications  Medication Sig Dispense Refill   b complex vitamins capsule Take 1 capsule by mouth daily. (Patient not taking: Reported on 12/04/2021)     BIOTIN PO Take by mouth daily. (Patient not taking: Reported on 12/04/2021)     cholecalciferol (VITAMIN D3) 25 MCG (1000 UNIT) tablet daily. (Patient not taking: Reported on 12/04/2021)     Cyanocobalamin (VITAMIN B-12 IJ) Inject as directed every 30 (thirty) days. (Patient not taking: Reported on 12/04/2021)     esomeprazole (NEXIUM) 40 MG capsule TAKE 1 CAPSULE BY MOUTH EVERY DAY 90 capsule 1   HYDROcodone-acetaminophen (NORCO/VICODIN) 5-325 MG tablet Take 1 tablet by mouth every 6 (six) hours as needed for moderate  pain. 30 tablet 0   ibuprofen (ADVIL) 800 MG tablet Take 1 tablet (800 mg total) by mouth every 8 (eight) hours as needed for mild pain. 30 tablet 0   levocetirizine (XYZAL) 5 MG tablet TAKE 1 TABLET BY MOUTH EVERY DAY IN THE EVENING 90 tablet 1   methenamine (HIPREX) 1 g tablet Take 1 tablet (1 g total) by mouth 2 (two) times daily with a meal. 180 tablet 1   Multiple Vitamins-Minerals (ONE-A-DAY WOMENS PO) Take by mouth. (Patient not taking: Reported on 12/04/2021)     nebivolol (BYSTOLIC) 5 MG tablet TAKE 1 TABLET BY MOUTH EVERY DAY 90 tablet 0   Rimegepant Sulfate (NURTEC) 75 MG TBDP Take 1 tablet by mouth every other day. (Patient taking differently: Take 1 tablet by mouth as needed.) 46 tablet 1   rosuvastatin (CRESTOR) 10 MG tablet TAKE 1 TABLET BY MOUTH EVERY DAY 90 tablet 1   spironolactone (ALDACTONE) 25 MG tablet TAKE 1 TABLET BY MOUTH EVERY DAY 90 tablet 0   No current facility-administered medications for this visit.     ALLERGIES: Amlodipine  Family History  Problem Relation Age of Onset   Heart disease Father    Diabetes Maternal Aunt    Breast cancer Maternal Grandmother    Allergic rhinitis Daughter    Eczema Daughter    Alcohol abuse Neg Hx    COPD Neg Hx    Depression Neg Hx    Drug abuse Neg Hx    Early death Neg Hx    Hearing loss Neg Hx    Hyperlipidemia Neg Hx    Hypertension Neg Hx    Kidney disease Neg Hx    Stroke Neg Hx    Colon cancer Neg Hx    Rectal cancer Neg Hx    Stomach cancer Neg Hx     Social History   Socioeconomic History   Marital status: Married    Spouse name: Not on file   Number of children: Not on file   Years of education: Not on file   Highest education level: Not on file  Occupational History   Not on file  Tobacco Use   Smoking status: Never   Smokeless tobacco: Never  Vaping Use   Vaping Use: Never used  Substance and Sexual Activity   Alcohol use: No   Drug use: No   Sexual activity: Yes    Birth  control/protection: Surgical    Comment: BTL  Other Topics Concern   Not on file  Social History Narrative   Not on file   Social Determinants of Health   Financial Resource Strain: Not on file  Food Insecurity: Not on file  Transportation Needs: Not on file  Physical Activity: Not on file  Stress: Not on file  Social Connections: Not on file  Intimate Partner Violence: Not on file    Review of Systems  All other systems reviewed and are negative.  PHYSICAL EXAMINATION:    BP 118/82    Pulse 83    Ht 5\' 4"  (1.626 m)    Wt 222 lb (100.7 kg)    LMP 11/04/2021    SpO2 100%    BMI 38.11 kg/m     General appearance: alert, cooperative and appears stated age   Abdomen: incisions intact.  Left lower incision with mild bleeding (Dermabond is off.) Abdomen is soft, non-tender, no masses,  no organomegaly  Pelvic: deferred.   ASSESSMENT  Status post laparoscopic hysterectomy with bilateral salpingectomy, cystoscopy.   PLAN  Surgical findings and pathology report discussed. Continue decreased activity with no lifting over 10 pounds.  No driving for one more week.  Check CBC with differential.  Return for 6 week post op visit.    An After Visit Summary was printed and given to the patient.

## 2021-12-06 ENCOUNTER — Other Ambulatory Visit: Payer: Self-pay | Admitting: Internal Medicine

## 2021-12-06 DIAGNOSIS — I10 Essential (primary) hypertension: Secondary | ICD-10-CM

## 2021-12-07 ENCOUNTER — Ambulatory Visit: Payer: BC Managed Care – PPO

## 2021-12-16 ENCOUNTER — Ambulatory Visit (INDEPENDENT_AMBULATORY_CARE_PROVIDER_SITE_OTHER): Payer: BC Managed Care – PPO

## 2021-12-16 ENCOUNTER — Other Ambulatory Visit: Payer: Self-pay

## 2021-12-16 DIAGNOSIS — E538 Deficiency of other specified B group vitamins: Secondary | ICD-10-CM

## 2021-12-16 MED ORDER — CYANOCOBALAMIN 1000 MCG/ML IJ SOLN
1000.0000 ug | Freq: Once | INTRAMUSCULAR | Status: AC
  Administered 2021-12-16: 14:00:00 1000 ug via INTRAMUSCULAR

## 2021-12-16 NOTE — Progress Notes (Signed)
B12 given Please cosign 

## 2021-12-21 ENCOUNTER — Encounter: Payer: Self-pay | Admitting: Internal Medicine

## 2021-12-21 LAB — HM DIABETES EYE EXAM

## 2022-01-04 ENCOUNTER — Ambulatory Visit (INDEPENDENT_AMBULATORY_CARE_PROVIDER_SITE_OTHER): Payer: BC Managed Care – PPO | Admitting: Obstetrics and Gynecology

## 2022-01-04 ENCOUNTER — Other Ambulatory Visit: Payer: Self-pay

## 2022-01-04 ENCOUNTER — Encounter: Payer: Self-pay | Admitting: Obstetrics and Gynecology

## 2022-01-04 VITALS — BP 122/80 | HR 66 | Ht 64.0 in | Wt 222.0 lb

## 2022-01-04 DIAGNOSIS — K5900 Constipation, unspecified: Secondary | ICD-10-CM

## 2022-01-04 DIAGNOSIS — Z90710 Acquired absence of both cervix and uterus: Secondary | ICD-10-CM

## 2022-01-04 NOTE — Progress Notes (Signed)
GYNECOLOGY  VISIT   HPI: 46 y.o.   Married  [place] American  female   [phone] with Patient's last menstrual period was 11/04/2021.   here for 6 weeks status post TOTAL LAPAROSCOPIC HYSTERECTOMY WITHBILATERAL SALPINGECTOMY, (Bilateral: Abdomen) CYSTOSCOPY (Bladder).  Some mid back pain since the surgery was done.  Occurs if she is sitting up too long.  Ibuprofen eases the pain.  Seemed to bother her when she went to watch her son's basketball game.   Having some shooting pain in her groin area that comes and goes.  She notices this almost as a pulsation.  More noticeable on the left side.   No pain with urination.   No vaginal bleeding.   Has chronic constipation.  Uses Miralax, but this is not working well for her.  Metamucil, which works better for her.     GYNECOLOGIC HISTORY: Patient's last menstrual period was 11/04/2021. Contraception:  Tubal/Hyst Menopausal hormone therapy:  n/a Last mammogram: 10-02-20 Neg/Birads2 --Pt.working on scheduling Last pap smear:  09-08-21 Neg:Neg HR HPV, 01-23-13 Neg        OB History     Gravida  3   Para  2   Term      Preterm      AB  1   Living  2      SAB  1   IAB      Ectopic      Multiple      Live Births                 Patient Active Problem List   Diagnosis Date Noted   Status post laparoscopic hysterectomy 11/24/2021   Cervical cancer screening 05/13/2021   Type II diabetes mellitus with manifestations ([place]) 05/13/2021   Onychomycosis 04/27/2021   Sensorineural hearing loss 04/27/2021   Migraine without aura and without status migrainosus, not intractable 11/17/2020   Routine general medical examination at a health care facility 04/01/2020   Intrinsic eczema 04/01/2020   Vitamin D deficiency disease 11/21/2018   B12 deficiency 11/21/2018   Perennial allergic rhinitis 03/06/2018   Seasonal allergic rhinitis due to pollen 05/09/2017   Hyperlipidemia LDL goal <130 12/21/2016   Snoring 11/09/2016    Other iron deficiency anemias 12/17/2014   GERD (gastroesophageal reflux disease) 01/09/2014   Constipation 01/09/2014   Positive ANA (antinuclear antibody) 07/16/2013   Essential hypertension, benign 07/11/2013   Visit for screening mammogram 07/11/2013   Obesity (BMI 30.0-34.9) 07/11/2013    Past Medical History:  Diagnosis Date   Anemia    Arthritis    oa   DM Type 2    diet controlled   Fibroids    Dr. Charlesetta Garibaldi, per patient.    GERD (gastroesophageal reflux disease)    Hyperlipidemia    Hypertension    Internal hemorrhoid    Intrinsic eczema 04/01/2020   migraine    Obesity    Sleep apnea    not currently using CPAP did not tolerate cpap   Urticaria    Vitamin B deficiency    Wears glasses 11/17/2021   for reading   Wears hearing aid in both ears 11/17/2021    Past Surgical History:  Procedure Laterality Date   colonscopy     x 2 last done 2021 or 2019   CYSTOSCOPY N/A 11/24/2021   Procedure: CYSTOSCOPY;  Surgeon: [name], MD;  Location: [facility];  Service: Gynecology;  Laterality: N/A;   DILATION AND CURETTAGE OF  UTERUS N/A 11/19/2000   TOTAL LAPAROSCOPIC HYSTERECTOMY WITH SALPINGECTOMY Bilateral 11/24/2021   Procedure: TOTAL LAPAROSCOPIC HYSTERECTOMY [place] SALPINGECTOMY,;  Surgeon: [name], MD;  Location: [facility];  Service: Gynecology;  Laterality: Bilateral;   TUBAL LIGATION  2004   wisdom teeth  12/20/1998    Current Outpatient Medications  Medication Sig Dispense Refill   esomeprazole (NEXIUM) 40 MG capsule TAKE 1 CAPSULE BY MOUTH EVERY DAY 90 capsule 1   ibuprofen (ADVIL) 800 MG tablet Take 1 tablet (800 mg total) by mouth every 8 (eight) hours as needed for mild pain. 30 tablet 0   levocetirizine (XYZAL) 5 MG tablet TAKE 1 TABLET BY MOUTH EVERY DAY IN THE EVENING 90 tablet 1   methenamine (HIPREX) 1 g tablet Take 1 tablet (1 g total) by mouth 2 (two) times daily with a meal.  180 tablet 1   nebivolol (BYSTOLIC) 5 MG tablet TAKE 1 TABLET BY MOUTH EVERY DAY 90 tablet 0   Rimegepant Sulfate (NURTEC) 75 MG TBDP Take 1 tablet by mouth every other day. (Patient taking differently: Take 1 tablet by mouth as needed.) 46 tablet 1   rosuvastatin (CRESTOR) 10 MG tablet TAKE 1 TABLET BY MOUTH EVERY DAY 90 tablet 1   spironolactone (ALDACTONE) 25 MG tablet TAKE 1 TABLET BY MOUTH EVERY DAY 90 tablet 0   b complex vitamins capsule Take 1 capsule by mouth daily. (Patient not taking: Reported on 12/04/2021)     BIOTIN PO Take by mouth daily. (Patient not taking: Reported on 12/04/2021)     cholecalciferol (VITAMIN D3) 25 MCG (1000 UNIT) tablet daily. (Patient not taking: Reported on 12/04/2021)     Cyanocobalamin (VITAMIN B-12 IJ) Inject as directed every 30 (thirty) days. (Patient not taking: Reported on 12/04/2021)     Multiple Vitamins-Minerals (ONE-A-DAY WOMENS PO) Take by mouth. (Patient not taking: Reported on 12/04/2021)     No current facility-administered medications for this visit.     ALLERGIES: Amlodipine  Family History  Problem Relation Age of Onset   Heart disease Father    Diabetes Maternal Aunt    Breast cancer Maternal Grandmother    Allergic rhinitis Daughter    Eczema Daughter    Alcohol abuse Neg Hx    COPD Neg Hx    Depression Neg Hx    Drug abuse Neg Hx    Early death Neg Hx    Hearing loss Neg Hx    Hyperlipidemia Neg Hx    Hypertension Neg Hx    Kidney disease Neg Hx    Stroke Neg Hx    Colon cancer Neg Hx    Rectal cancer Neg Hx    Stomach cancer Neg Hx     Social History   Socioeconomic History   Marital status: Married    Spouse name: Not on file   Number of children: Not on file   Years of education: Not on file   Highest education level: Not on file  Occupational History   Not on file  Tobacco Use   Smoking status: Never   Smokeless tobacco: Never  Vaping Use   Vaping Use: Never used  Substance and Sexual Activity    Alcohol use: No   Drug use: No   Sexual activity: Yes    Birth control/protection: Surgical    Comment: BTL  Other Topics Concern   Not on file  Social History Narrative   Not on file   Social Determinants of Health  Financial Resource Strain: Not on file  Food Insecurity: Not on file  Transportation Needs: Not on file  Physical Activity: Not on file  Stress: Not on file  Social Connections: Not on file  Intimate Partner Violence: Not on file    Review of Systems  All other systems reviewed and are negative.  PHYSICAL EXAMINATION:    BP 122/80    Pulse 66    Ht 5\' 4"  (1.626 m)    Wt 222 lb (100.7 kg)    LMP 11/04/2021    SpO2 99%    BMI 38.11 kg/m     General appearance: alert, cooperative and appears stated age   Abdomen: incisions intact, Abdomen is soft, non-tender, no masses,  no organomegaly   Pelvic: External genitalia:  no lesions              Urethra:  normal appearing urethra with no masses, tenderness or lesions              Bartholins and Skenes: normal                 Vagina: normal appearing vagina with normal color and discharge, no lesions              Cervix: absent.                  Bimanual Exam:  Uterus:  absent.  Suture palpable along vaginal cuff.               Adnexa: no mass, fullness, tenderness        Chaperone was present for exam:  Estill Bamberg, CMA  ASSESSMENT  Status post laparoscopic hysterectomy with bilateral salpingectomy, cystoscopy.  Healing well.  Chronic constipation.   PLAN  Try to switch to Tylenol for pain control. Reduce Ibuprofen.  Increase fiber and fluids in her diet.  Try Colace 100 mg po q day to bid prn.  Ok to increase activity.  Nothing per vagina and no vigorous exercise yet.  Fu for 3 month post op visit.    An After Visit Summary was printed and given to the patient.

## 2022-01-09 ENCOUNTER — Other Ambulatory Visit: Payer: Self-pay | Admitting: Internal Medicine

## 2022-01-09 DIAGNOSIS — N390 Urinary tract infection, site not specified: Secondary | ICD-10-CM

## 2022-01-13 ENCOUNTER — Ambulatory Visit (INDEPENDENT_AMBULATORY_CARE_PROVIDER_SITE_OTHER): Payer: BC Managed Care – PPO

## 2022-01-13 ENCOUNTER — Other Ambulatory Visit: Payer: Self-pay

## 2022-01-13 DIAGNOSIS — E538 Deficiency of other specified B group vitamins: Secondary | ICD-10-CM

## 2022-01-13 MED ORDER — CYANOCOBALAMIN 1000 MCG/ML IJ SOLN
1000.0000 ug | Freq: Once | INTRAMUSCULAR | Status: AC
  Administered 2022-01-13: 15:00:00 1000 ug via INTRAMUSCULAR

## 2022-01-13 NOTE — Progress Notes (Signed)
B12 given and tolerated well °

## 2022-01-27 ENCOUNTER — Other Ambulatory Visit: Payer: Self-pay | Admitting: Internal Medicine

## 2022-01-27 DIAGNOSIS — T502X5A Adverse effect of carbonic-anhydrase inhibitors, benzothiadiazides and other diuretics, initial encounter: Secondary | ICD-10-CM

## 2022-01-27 DIAGNOSIS — I10 Essential (primary) hypertension: Secondary | ICD-10-CM

## 2022-01-27 DIAGNOSIS — E876 Hypokalemia: Secondary | ICD-10-CM

## 2022-02-15 ENCOUNTER — Other Ambulatory Visit: Payer: Self-pay

## 2022-02-15 ENCOUNTER — Ambulatory Visit (INDEPENDENT_AMBULATORY_CARE_PROVIDER_SITE_OTHER): Payer: BC Managed Care – PPO

## 2022-02-15 DIAGNOSIS — E538 Deficiency of other specified B group vitamins: Secondary | ICD-10-CM

## 2022-02-15 MED ORDER — CYANOCOBALAMIN 1000 MCG/ML IJ SOLN
1000.0000 ug | Freq: Once | INTRAMUSCULAR | Status: AC
  Administered 2022-02-15: 1000 ug via INTRAMUSCULAR

## 2022-02-15 NOTE — Progress Notes (Signed)
Pt here for monthly B12 injection per Dr. Ronnald Ramp  B12 1030mcg given IM, and pt tolerated injection well.  Next B12 injection scheduled for 03/15/22

## 2022-02-19 ENCOUNTER — Other Ambulatory Visit: Payer: Self-pay | Admitting: Internal Medicine

## 2022-02-19 DIAGNOSIS — K2100 Gastro-esophageal reflux disease with esophagitis, without bleeding: Secondary | ICD-10-CM

## 2022-02-19 MED ORDER — OMEPRAZOLE 40 MG PO CPDR: 40.0000 mg | DELAYED_RELEASE_CAPSULE | Freq: Every day | ORAL | 1 refills | Status: DC

## 2022-03-09 NOTE — Progress Notes (Signed)
GYNECOLOGY  VISIT ?  ?HPI: ?46 y.o.   Married  [place] American  female   ?[id number] with Patient's last menstrual period was 11/04/2021.   ?here for 12 weeks status post ?TOTAL LAPAROSCOPIC HYSTERECTOMY WITHBILATERAL SALPINGECTOMY, (Bilateral: Abdomen) CYSTOSCOPY (Bladder). ? ?Pain in the umbilical incision with sneeze.  ? ?No bleeding or spotting.  ? ?Patient complaining of urinary frequency. ?No dysuria. ? ?She takes methenamine for history of UTIs. ? ?GYNECOLOGIC HISTORY: ?Patient's last menstrual period was 11/04/2021. ?Contraception:  Tubal/Hyst ?Menopausal hormone therapy:  none ?Last mammogram:  10-02-20 Neg/Birads2 --pt. To call to schedule appt. ?Last pap smear:  09-08-21 Neg:Neg HR HPV, 01-23-13 Neg ?       ?OB History   ? ? Gravida  ?3  ? Para  ?2  ? Term  ?   ? Preterm  ?   ? AB  ?1  ? Living  ?2  ?  ? ? SAB  ?1  ? IAB  ?   ? Ectopic  ?   ? Multiple  ?   ? Live Births  ?   ?   ?  ?  ?    ? ?Patient Active Problem List  ? Diagnosis Date Noted  ? Status post laparoscopic hysterectomy 11/24/2021  ? Cervical cancer screening 05/13/2021  ? Type II diabetes mellitus with manifestations ([place]) 05/13/2021  ? Onychomycosis 04/27/2021  ? Sensorineural hearing loss 04/27/2021  ? Migraine without aura and without status migrainosus, not intractable 11/17/2020  ? Routine general medical examination at a health care facility 04/01/2020  ? Intrinsic eczema 04/01/2020  ? Vitamin D deficiency disease 11/21/2018  ? B12 deficiency 11/21/2018  ? Perennial allergic rhinitis 03/06/2018  ? Seasonal allergic rhinitis due to pollen 05/09/2017  ? Hyperlipidemia LDL goal <130 12/21/2016  ? Snoring 11/09/2016  ? Other iron deficiency anemias 12/17/2014  ? GERD (gastroesophageal reflux disease) 01/09/2014  ? Constipation 01/09/2014  ? Positive ANA (antinuclear antibody) 07/16/2013  ? Essential hypertension, benign 07/11/2013  ? Visit for screening mammogram 07/11/2013  ? Obesity (BMI 30.0-34.9) 07/11/2013  ? ? ?Past Medical History:   ?Diagnosis Date  ? Anemia   ? Arthritis   ? oa  ? DM Type 2   ? diet controlled  ? Fibroids   ? Dr. Charlesetta Garibaldi, per patient.   ? GERD (gastroesophageal reflux disease)   ? Hyperlipidemia   ? Hypertension   ? Internal hemorrhoid   ? Intrinsic eczema 04/01/2020  ? migraine   ? Obesity   ? Sleep apnea   ? not currently using CPAP did not tolerate cpap  ? Urticaria   ? Vitamin B deficiency   ? Wears glasses 11/17/2021  ? for reading  ? Wears hearing aid in both ears 11/17/2021  ? ? ?Past Surgical History:  ?Procedure Laterality Date  ? colonscopy    ? x 2 last done 2021 or 2019  ? CYSTOSCOPY N/A 11/24/2021  ? Procedure: CYSTOSCOPY;  Surgeon: [name], MD;  Location: [facility];  Service: Gynecology;  Laterality: N/A;  ? DILATION AND CURETTAGE OF UTERUS N/A 11/19/2000  ? TOTAL LAPAROSCOPIC HYSTERECTOMY WITH SALPINGECTOMY Bilateral 11/24/2021  ? Procedure: TOTAL LAPAROSCOPIC HYSTERECTOMY [place] SALPINGECTOMY,;  Surgeon: [name], MD;  Location: [facility];  Service: Gynecology;  Laterality: Bilateral;  ? TUBAL LIGATION  2004  ? wisdom teeth  12/20/1998  ? ? ?Current Outpatient Medications  ?Medication Sig Dispense Refill  ? Cyanocobalamin (VITAMIN B-12 IJ)  Inject as directed every 30 (thirty) days. (Patient not taking: Reported on 12/04/2021)    ? levocetirizine (XYZAL) 5 MG tablet TAKE 1 TABLET BY MOUTH EVERY DAY IN THE EVENING 90 tablet 1  ? methenamine (HIPREX) 1 g tablet TAKE 1 TABLET BY MOUTH 2 TIMES DAILY WITH A MEAL. 180 tablet 1  ? nebivolol (BYSTOLIC) 5 MG tablet TAKE 1 TABLET BY MOUTH EVERY DAY 90 tablet 0  ? omeprazole (PRILOSEC) 40 MG capsule Take 1 capsule (40 mg total) by mouth daily. 90 capsule 1  ? Rimegepant Sulfate (NURTEC) 75 MG TBDP Take 1 tablet by mouth every other day. (Patient taking differently: Take 1 tablet by mouth as needed.) 46 tablet 1  ? rosuvastatin (CRESTOR) 10 MG tablet TAKE 1 TABLET BY MOUTH EVERY DAY 90 tablet 1  ?  spironolactone (ALDACTONE) 25 MG tablet TAKE 1 TABLET BY MOUTH EVERY DAY 90 tablet 0  ? ?No current facility-administered medications for this visit.  ?  ? ?ALLERGIES: Amlodipine ? ?Family History  ?Problem Relation Age of Onset  ? Heart disease Father   ? Diabetes Maternal Aunt   ? Breast cancer Maternal Grandmother   ? Allergic rhinitis Daughter   ? Eczema Daughter   ? Alcohol abuse Neg Hx   ? COPD Neg Hx   ? Depression Neg Hx   ? Drug abuse Neg Hx   ? Early death Neg Hx   ? Hearing loss Neg Hx   ? Hyperlipidemia Neg Hx   ? Hypertension Neg Hx   ? Kidney disease Neg Hx   ? Stroke Neg Hx   ? Colon cancer Neg Hx   ? Rectal cancer Neg Hx   ? Stomach cancer Neg Hx   ? ? ?Social History  ? ?Socioeconomic History  ? Marital status: Married  ?  Spouse name: Not on file  ? Number of children: Not on file  ? Years of education: Not on file  ? Highest education level: Not on file  ?Occupational History  ? Not on file  ?Tobacco Use  ? Smoking status: Never  ? Smokeless tobacco: Never  ?Vaping Use  ? Vaping Use: Never used  ?Substance and Sexual Activity  ? Alcohol use: No  ? Drug use: No  ? Sexual activity: Yes  ?  Birth control/protection: Surgical  ?  Comment: BTL  ?Other Topics Concern  ? Not on file  ?Social History Narrative  ? Not on file  ? ?Social Determinants of Health  ? ?Financial Resource Strain: Not on file  ?Food Insecurity: Not on file  ?Transportation Needs: Not on file  ?Physical Activity: Not on file  ?Stress: Not on file  ?Social Connections: Not on file  ?Intimate Partner Violence: Not on file  ? ? ?Review of Systems  ?Genitourinary:  Positive for frequency.  ?All other systems reviewed and are negative. ? ?PHYSICAL EXAMINATION:   ? ?BP 122/80   Pulse 67   Ht '5\' 4"'$  (1.626 m)   Wt 222 lb (100.7 kg)   LMP 11/04/2021   SpO2 97%   BMI 38.11 kg/m?     ?General appearance: alert, cooperative and appears stated age ?  ?Abdomen: incisions intact.  Abdomen is soft, non-tender, no masses,  no organomegaly ?   ?Pelvic: External genitalia:  no lesions ?             Urethra:  normal appearing urethra with no masses, tenderness or lesions ?  Bartholins and Skenes: normal    ?             Vagina: normal appearing vagina with normal color and discharge, no lesions ?             Cervix: absent ?               ?Bimanual Exam:  Uterus:  absent ?             Adnexa: no mass, fullness, tenderness ?           ? ?Chaperone was present for exam:  Estill Bamberg, CMA ? ?ASSESSMENT ? ?Status post laparoscopic hysterectomy with bilateral salpingectomy, cystoscopy for fibroids and endometrial polyp. ?Urinary frequency. ?Hx UTIs.  On methenamine.  ? ?PLAN ? ?OK to return to all normal activities - sexual activity, exercise, heavy lifting.  ?Urinalysis sg 1.015, pH 6.5, 6 - 10 WBC, NS RBC, 6 - 10 squams, moderate bacteria, few yeast.  ?UC sent.  ?Start Macrobid 100 mg po bid x 5 days.   ?Ok to stop methanamine while treating with Macrobid.  ?Hydrate well.  ?She will use Monistat if she develops symptoms of yeast infection.  ?Return if bladder symptoms do not improve. ?Annual exam in Sept. 2023.  ? ?An After Visit Summary was printed and given to the patient. ? ? ?

## 2022-03-10 ENCOUNTER — Encounter: Payer: Self-pay | Admitting: Obstetrics and Gynecology

## 2022-03-10 ENCOUNTER — Other Ambulatory Visit: Payer: Self-pay | Admitting: Obstetrics and Gynecology

## 2022-03-10 ENCOUNTER — Ambulatory Visit (INDEPENDENT_AMBULATORY_CARE_PROVIDER_SITE_OTHER): Payer: BC Managed Care – PPO | Admitting: Obstetrics and Gynecology

## 2022-03-10 ENCOUNTER — Other Ambulatory Visit: Payer: Self-pay

## 2022-03-10 VITALS — BP 122/80 | HR 67 | Ht 64.0 in | Wt 222.0 lb

## 2022-03-10 DIAGNOSIS — Z1231 Encounter for screening mammogram for malignant neoplasm of breast: Secondary | ICD-10-CM

## 2022-03-10 DIAGNOSIS — Z90710 Acquired absence of both cervix and uterus: Secondary | ICD-10-CM

## 2022-03-10 DIAGNOSIS — R350 Frequency of micturition: Secondary | ICD-10-CM | POA: Diagnosis not present

## 2022-03-10 MED ORDER — NITROFURANTOIN MONOHYD MACRO 100 MG PO CAPS: ORAL_CAPSULE | ORAL | 0 refills | Status: DC

## 2022-03-10 NOTE — Patient Instructions (Signed)
Urinary Tract Infection, Adult A urinary tract infection (UTI) is an infection of any part of the urinary tract. The urinary tract includes the kidneys, ureters, bladder, and urethra. These organs make, store, and get rid of urine in the body. An upper UTI affects the ureters and kidneys. A lower UTI affects the bladder and urethra. What are the causes? Most urinary tract infections are caused by bacteria in your genital area around your urethra, where urine leaves your body. These bacteria grow and cause inflammation of your urinary tract. What increases the risk? You are more likely to develop this condition if: You have a urinary catheter that stays in place. You are not able to control when you urinate or have a bowel movement (incontinence). You are female and you: Use a spermicide or diaphragm for birth control. Have low estrogen levels. Are pregnant. You have certain genes that increase your risk. You are sexually active. You take antibiotic medicines. You have a condition that causes your flow of urine to slow down, such as: An enlarged prostate, if you are female. Blockage in your urethra. A kidney stone. A nerve condition that affects your bladder control (neurogenic bladder). Not getting enough to drink, or not urinating often. You have certain medical conditions, such as: Diabetes. A weak disease-fighting system (immunesystem). Sickle cell disease. Gout. Spinal cord injury. What are the signs or symptoms? Symptoms of this condition include: Needing to urinate right away (urgency). Frequent urination. This may include small amounts of urine each time you urinate. Pain or burning with urination. Blood in the urine. Urine that smells bad or unusual. Trouble urinating. Cloudy urine. Vaginal discharge, if you are female. Pain in the abdomen or the lower back. You may also have: Vomiting or a decreased appetite. Confusion. Irritability or tiredness. A fever or  chills. Diarrhea. The first symptom in older adults may be confusion. In some cases, they may not have any symptoms until the infection has worsened. How is this diagnosed? This condition is diagnosed based on your medical history and a physical exam. You may also have other tests, including: Urine tests. Blood tests. Tests for STIs (sexually transmitted infections). If you have had more than one UTI, a cystoscopy or imaging studies may be done to determine the cause of the infections. How is this treated? Treatment for this condition includes: Antibiotic medicine. Over-the-counter medicines to treat discomfort. Drinking enough water to stay hydrated. If you have frequent infections or have other conditions such as a kidney stone, you may need to see a health care provider who specializes in the urinary tract (urologist). In rare cases, urinary tract infections can cause sepsis. Sepsis is a life-threatening condition that occurs when the body responds to an infection. Sepsis is treated in the hospital with IV antibiotics, fluids, and other medicines. Follow these instructions at home: Medicines Take over-the-counter and prescription medicines only as told by your health care provider. If you were prescribed an antibiotic medicine, take it as told by your health care provider. Do not stop using the antibiotic even if you start to feel better. General instructions Make sure you: Empty your bladder often and completely. Do not hold urine for long periods of time. Empty your bladder after sex. Wipe from front to back after urinating or having a bowel movement if you are female. Use each tissue only one time when you wipe. Drink enough fluid to keep your urine pale yellow. Keep all follow-up visits. This is important. Contact a health care provider   if: Your symptoms do not get better after 1-2 days. Your symptoms go away and then return. Get help right away if: You have severe pain in your  back or your lower abdomen. You have a fever or chills. You have nausea or vomiting. Summary A urinary tract infection (UTI) is an infection of any part of the urinary tract, which includes the kidneys, ureters, bladder, and urethra. Most urinary tract infections are caused by bacteria in your genital area. Treatment for this condition often includes antibiotic medicines. If you were prescribed an antibiotic medicine, take it as told by your health care provider. Do not stop using the antibiotic even if you start to feel better. Keep all follow-up visits. This is important. This information is not intended to replace advice given to you by your health care provider. Make sure you discuss any questions you have with your health care provider. Document Revised: 07/18/2020 Document Reviewed: 07/18/2020 Elsevier Patient Education  2022 [employer]  

## 2022-03-11 ENCOUNTER — Other Ambulatory Visit: Payer: Self-pay | Admitting: Internal Medicine

## 2022-03-11 DIAGNOSIS — I10 Essential (primary) hypertension: Secondary | ICD-10-CM

## 2022-03-12 ENCOUNTER — Ambulatory Visit
Admission: RE | Admit: 2022-03-12 | Discharge: 2022-03-12 | Disposition: A | Discharge status: Home / Self Care | Payer: BC Managed Care – PPO | Source: Ambulatory Visit | Attending: Obstetrics and Gynecology | Admitting: Obstetrics and Gynecology

## 2022-03-12 DIAGNOSIS — Z1231 Encounter for screening mammogram for malignant neoplasm of breast: Secondary | ICD-10-CM | POA: Diagnosis not present

## 2022-03-12 LAB — URINALYSIS, COMPLETE W/RFL CULTURE
Bilirubin Urine: NEGATIVE
Glucose, UA: NEGATIVE
Hgb urine dipstick: NEGATIVE
Hyaline Cast: NONE SEEN /LPF
Ketones, ur: NEGATIVE
Nitrites, Initial: POSITIVE — AB
Protein, ur: NEGATIVE
RBC / HPF: NONE SEEN /HPF (ref 0–2)
Specific Gravity, Urine: 1.015 (ref 1.001–1.035)
pH: 6.5 (ref 5.0–8.0)

## 2022-03-12 LAB — CULTURE INDICATED

## 2022-03-12 LAB — URINE CULTURE
MICRO NUMBER:: 13164608
SPECIMEN QUALITY:: ADEQUATE

## 2022-03-15 ENCOUNTER — Ambulatory Visit (INDEPENDENT_AMBULATORY_CARE_PROVIDER_SITE_OTHER): Payer: BC Managed Care – PPO

## 2022-03-15 ENCOUNTER — Other Ambulatory Visit: Payer: Self-pay | Admitting: Obstetrics and Gynecology

## 2022-03-15 ENCOUNTER — Other Ambulatory Visit: Payer: Self-pay

## 2022-03-15 DIAGNOSIS — R928 Other abnormal and inconclusive findings on diagnostic imaging of breast: Secondary | ICD-10-CM

## 2022-03-15 DIAGNOSIS — E538 Deficiency of other specified B group vitamins: Secondary | ICD-10-CM | POA: Diagnosis not present

## 2022-03-15 MED ORDER — CYANOCOBALAMIN 1000 MCG/ML IJ SOLN
1000.0000 ug | Freq: Once | INTRAMUSCULAR | Status: AC
  Administered 2022-03-15: 1000 ug via INTRAMUSCULAR

## 2022-03-15 NOTE — Progress Notes (Signed)
Pt here for monthly B12 injection per Dr. Jones ? ?B12 1000mcg given IM, and pt tolerated injection well. ? ?Next B12 injection scheduled for 04/15/22 ?

## 2022-03-19 ENCOUNTER — Ambulatory Visit: Payer: BC Managed Care – PPO

## 2022-03-19 ENCOUNTER — Ambulatory Visit
Admission: RE | Admit: 2022-03-19 | Discharge: 2022-03-19 | Disposition: A | Discharge status: Home / Self Care | Payer: BC Managed Care – PPO | Source: Ambulatory Visit | Attending: Obstetrics and Gynecology | Admitting: Obstetrics and Gynecology

## 2022-03-19 DIAGNOSIS — R922 Inconclusive mammogram: Secondary | ICD-10-CM | POA: Diagnosis not present

## 2022-03-19 DIAGNOSIS — R928 Other abnormal and inconclusive findings on diagnostic imaging of breast: Secondary | ICD-10-CM

## 2022-04-15 ENCOUNTER — Other Ambulatory Visit: Payer: Self-pay | Admitting: Internal Medicine

## 2022-04-15 ENCOUNTER — Ambulatory Visit (INDEPENDENT_AMBULATORY_CARE_PROVIDER_SITE_OTHER): Payer: BC Managed Care – PPO

## 2022-04-15 DIAGNOSIS — E538 Deficiency of other specified B group vitamins: Secondary | ICD-10-CM

## 2022-04-15 DIAGNOSIS — E876 Hypokalemia: Secondary | ICD-10-CM

## 2022-04-15 DIAGNOSIS — I10 Essential (primary) hypertension: Secondary | ICD-10-CM

## 2022-04-15 MED ORDER — CYANOCOBALAMIN 1000 MCG/ML IJ SOLN
1000.0000 ug | Freq: Once | INTRAMUSCULAR | Status: AC
  Administered 2022-04-15: 1000 ug via INTRAMUSCULAR

## 2022-04-15 NOTE — Progress Notes (Signed)
Pt came into the office to receive her b12 injection. She tolerated the injection well. No questions or concerns.  ?

## 2022-04-16 ENCOUNTER — Other Ambulatory Visit: Payer: Self-pay | Admitting: Internal Medicine

## 2022-04-16 DIAGNOSIS — I10 Essential (primary) hypertension: Secondary | ICD-10-CM

## 2022-04-16 DIAGNOSIS — E876 Hypokalemia: Secondary | ICD-10-CM

## 2022-04-16 MED ORDER — SPIRONOLACTONE 25 MG PO TABS: 25.0000 mg | ORAL_TABLET | Freq: Every day | ORAL | 0 refills | Status: DC

## 2022-05-19 ENCOUNTER — Ambulatory Visit: Payer: BC Managed Care – PPO

## 2022-05-20 ENCOUNTER — Encounter: Payer: Self-pay | Admitting: Internal Medicine

## 2022-05-20 ENCOUNTER — Ambulatory Visit (INDEPENDENT_AMBULATORY_CARE_PROVIDER_SITE_OTHER): Payer: BC Managed Care – PPO | Admitting: Internal Medicine

## 2022-05-20 ENCOUNTER — Other Ambulatory Visit: Payer: Self-pay | Admitting: Internal Medicine

## 2022-05-20 VITALS — BP 122/84 | HR 66 | Resp 18 | Ht 64.0 in | Wt 223.4 lb

## 2022-05-20 DIAGNOSIS — E538 Deficiency of other specified B group vitamins: Secondary | ICD-10-CM | POA: Diagnosis not present

## 2022-05-20 DIAGNOSIS — M2550 Pain in unspecified joint: Secondary | ICD-10-CM | POA: Diagnosis not present

## 2022-05-20 DIAGNOSIS — R300 Dysuria: Secondary | ICD-10-CM | POA: Diagnosis not present

## 2022-05-20 DIAGNOSIS — E118 Type 2 diabetes mellitus with unspecified complications: Secondary | ICD-10-CM | POA: Diagnosis not present

## 2022-05-20 LAB — URINALYSIS, ROUTINE W REFLEX MICROSCOPIC
Bilirubin Urine: NEGATIVE
Hgb urine dipstick: NEGATIVE
Ketones, ur: NEGATIVE
Nitrite: POSITIVE — AB
Specific Gravity, Urine: 1.01 (ref 1.000–1.030)
Total Protein, Urine: NEGATIVE
Urine Glucose: NEGATIVE
Urobilinogen, UA: 0.2 (ref 0.0–1.0)
pH: 6 (ref 5.0–8.0)

## 2022-05-20 LAB — VITAMIN B12: Vitamin B-12: 668 pg/mL (ref 211–911)

## 2022-05-20 LAB — SEDIMENTATION RATE: Sed Rate: 19 mm/hr (ref 0–20)

## 2022-05-20 LAB — HEMOGLOBIN A1C: Hgb A1c MFr Bld: 7.3 % — ABNORMAL HIGH (ref 4.6–6.5)

## 2022-05-20 MED ORDER — NAPROXEN 500 MG PO TABS: 500.0000 mg | ORAL_TABLET | Freq: Two times a day (BID) | ORAL | 3 refills | Status: DC

## 2022-05-20 MED ORDER — NITROFURANTOIN MONOHYD MACRO 100 MG PO CAPS: 100.0000 mg | ORAL_CAPSULE | Freq: Two times a day (BID) | ORAL | 0 refills | Status: DC

## 2022-05-20 NOTE — Patient Instructions (Signed)
It is okay to stop the hibrex and the B12 shots.  We are checking the labs today and have refilled the naproxen to take twice a day as needed.

## 2022-05-20 NOTE — Assessment & Plan Note (Signed)
Checking B12 level. It appears she was getting monthly B12 shots for 3 years after borderline level. She would like to switch to oral therapy if possible. Adjust as needed after labs.

## 2022-05-20 NOTE — Assessment & Plan Note (Signed)
Has been taking hiprex 1g BID and has not noticed a decrease in UTI and feels she has an infection now. Will D/C hiprex. Checking U/A.

## 2022-05-20 NOTE — Progress Notes (Signed)
   Subjective:   Patient ID: Samantha Ball, female    DOB: [birth year], 47 y.o.   MRN: [id number]  HPI The patient is a 46 YO female coming in for pain in her joints all over. She was taking naproxen regularly and ran out about 1 month ago. Pain started 1-2 weeks ago. No trigger or increase in exercise or injury. She has been taking excedrin for headache. No joint swelling but has some pressure. Also concerns about B12 and possible UTI.  Review of Systems  Constitutional:  Positive for activity change. Negative for appetite change, chills, fatigue, fever and unexpected weight change.  Respiratory: Negative.    Cardiovascular: Negative.   Gastrointestinal: Negative.   Genitourinary:  Positive for dysuria.  Musculoskeletal:  Positive for arthralgias, back pain and myalgias. Negative for gait problem and joint swelling.  Skin: Negative.   Neurological: Negative.    Objective:  Physical Exam Constitutional:      Appearance: She is well-developed.  HENT:     Head: Normocephalic and atraumatic.  Cardiovascular:     Rate and Rhythm: Normal rate and regular rhythm.  Pulmonary:     Effort: Pulmonary effort is normal. No respiratory distress.     Breath sounds: Normal breath sounds. No wheezing or rales.  Abdominal:     General: Bowel sounds are normal. There is no distension.     Palpations: Abdomen is soft.     Tenderness: There is no abdominal tenderness. There is no rebound.  Musculoskeletal:        General: Tenderness present.     Cervical back: Normal range of motion.     Comments: No noticeable joint effusion.  Skin:    General: Skin is warm and dry.  Neurological:     Mental Status: She is alert and oriented to person, place, and time.     Coordination: Coordination normal.    Vitals:   05/20/22 1022  BP: 122/84  Pulse: 66  Resp: 18  SpO2: 100%  Weight: 223 lb 6.4 oz (101.3 kg)  Height: '5\' 4"'$  (1.626 m)    Assessment & Plan:

## 2022-05-20 NOTE — Assessment & Plan Note (Signed)
Since doing labs she is due for HgA1c so this is ordered today. Is diet controlled currently. Taking statin.

## 2022-05-20 NOTE — Assessment & Plan Note (Signed)
Checking CBC and ESR. Rx naproxen 500 mg BID for 1 month.

## 2022-05-21 ENCOUNTER — Other Ambulatory Visit: Payer: Self-pay | Admitting: Internal Medicine

## 2022-05-21 NOTE — Telephone Encounter (Signed)
Pls advise on email.../lmb 

## 2022-05-25 ENCOUNTER — Other Ambulatory Visit: Payer: Self-pay | Admitting: Internal Medicine

## 2022-05-25 DIAGNOSIS — E118 Type 2 diabetes mellitus with unspecified complications: Secondary | ICD-10-CM

## 2022-05-25 MED ORDER — TIRZEPATIDE 2.5 MG/0.5ML ~~LOC~~ SOAJ: 2.5000 mg | SUBCUTANEOUS | 0 refills | Status: DC

## 2022-05-26 ENCOUNTER — Other Ambulatory Visit: Payer: Self-pay | Admitting: Internal Medicine

## 2022-05-26 ENCOUNTER — Telehealth: Payer: Self-pay

## 2022-05-26 DIAGNOSIS — E785 Hyperlipidemia, unspecified: Secondary | ICD-10-CM

## 2022-05-26 NOTE — Telephone Encounter (Signed)
PA approved for the dates of 05/26/22 - 05/27/23.  Determination sent to scan.

## 2022-05-26 NOTE — Telephone Encounter (Signed)
Key: [id number]

## 2022-06-04 ENCOUNTER — Other Ambulatory Visit: Payer: Self-pay | Admitting: Internal Medicine

## 2022-06-04 DIAGNOSIS — J3089 Other allergic rhinitis: Secondary | ICD-10-CM

## 2022-06-14 NOTE — Progress Notes (Signed)
Office Visit Note  Patient: Samantha Ball             Date of Birth: 07-Oct-1976           MRN: [id number]             PCP: [name], MD Referring: [name], MD Visit Date: 06/25/2022 Occupation: '@GUAROCC'$ @  Subjective:  Pain in multiple joints  History of Present Illness: Samantha Ball is a 46 y.o. female with history of positive ANA and osteoarthritis.  She states she underwent hysterectomy in December 2022 without any complications.  She has noticed improvement in hair loss.  She continues to have fatigue.  She gives history of occasional oral ulcers, dry eyes.  She denies any history of inflammatory arthritis, Raynaud's phenomenon, rash, photosensitivity or lymphadenopathy.  She continues to have some discomfort in her hands and her knee joints.    Activities of Daily Living:  Patient reports morning stiffness for 30 minutes.   Patient Reports nocturnal pain.  Difficulty dressing/grooming: Denies Difficulty climbing stairs: Reports Difficulty getting out of chair: Denies Difficulty using hands for taps, buttons, cutlery, and/or writing: Reports  Review of Systems  Constitutional:  Positive for fatigue.  HENT:  Positive for mouth sores. Negative for mouth dryness and nose dryness.   Eyes:  Positive for dryness. Negative for pain and itching.  Respiratory:  Negative for shortness of breath and difficulty breathing.   Cardiovascular:  Negative for chest pain and palpitations.  Gastrointestinal:  Positive for blood in stool and constipation. Negative for diarrhea.       History of hemorrhoids  Endocrine: Positive for increased urination.  Genitourinary:  Positive for difficulty urinating.  Musculoskeletal:  Positive for joint pain, joint pain and morning stiffness. Negative for joint swelling, myalgias, muscle tenderness and myalgias.  Skin:  Negative for color change, rash, redness and sensitivity to sunlight.  Allergic/Immunologic: Negative for susceptible to  infections.  Neurological:  Positive for numbness and headaches. Negative for dizziness, memory loss and weakness.  Hematological:  Positive for bruising/bleeding tendency. Negative for swollen glands.  Psychiatric/Behavioral:  Positive for sleep disturbance. Negative for depressed mood and confusion. The patient is not nervous/anxious.     PMFS History:  Patient Active Problem List   Diagnosis Date Noted   Dysuria 05/20/2022   Status post laparoscopic hysterectomy 11/24/2021   Cervical cancer screening 05/13/2021   Type II diabetes mellitus with manifestations ([place]) 05/13/2021   Onychomycosis 04/27/2021   Sensorineural hearing loss 04/27/2021   Migraine without aura and without status migrainosus, not intractable 11/17/2020   Routine general medical examination at a health care facility 04/01/2020   Intrinsic eczema 04/01/2020   Arthralgia 02/01/2019   Vitamin D deficiency disease 11/21/2018   B12 deficiency 11/21/2018   Perennial allergic rhinitis 03/06/2018   Seasonal allergic rhinitis due to pollen 05/09/2017   Hyperlipidemia LDL goal <130 12/21/2016   Snoring 11/09/2016   Other iron deficiency anemias 12/17/2014   GERD (gastroesophageal reflux disease) 01/09/2014   Constipation 01/09/2014   Positive ANA (antinuclear antibody) 07/16/2013   Essential hypertension, benign 07/11/2013   Visit for screening mammogram 07/11/2013   Obesity (BMI 30.0-34.9) 07/11/2013    Past Medical History:  Diagnosis Date   Anemia    Arthritis    oa   DM Type 2    diet controlled   Fibroids    Dr. Charlesetta Garibaldi, per patient.    GERD (gastroesophageal reflux disease)    Hyperlipidemia  Hypertension    Internal hemorrhoid    Intrinsic eczema 04/01/2020   migraine    Obesity    Sleep apnea    not currently using CPAP did not tolerate cpap   Urticaria    Vitamin B deficiency    Wears glasses 11/17/2021   for reading   Wears hearing aid in both ears 11/17/2021    Family History  Problem  Relation Age of Onset   Heart disease Father    Allergic rhinitis Daughter    Eczema Daughter    Breast cancer Maternal Aunt    Diabetes Maternal Aunt    Breast cancer Maternal Grandmother    Breast cancer Cousin    Alcohol abuse Neg Hx    COPD Neg Hx    Depression Neg Hx    Drug abuse Neg Hx    Early death Neg Hx    Hearing loss Neg Hx    Hyperlipidemia Neg Hx    Hypertension Neg Hx    Kidney disease Neg Hx    Stroke Neg Hx    Colon cancer Neg Hx    Rectal cancer Neg Hx    Stomach cancer Neg Hx    Past Surgical History:  Procedure Laterality Date   colonscopy     x 2 last done 2021 or 2019   CYSTOSCOPY N/A 11/24/2021   Procedure: CYSTOSCOPY;  Surgeon: [name], MD;  Location: [facility];  Service: Gynecology;  Laterality: N/A;   DILATION AND CURETTAGE OF UTERUS N/A 11/19/2000   TOTAL LAPAROSCOPIC HYSTERECTOMY WITH SALPINGECTOMY Bilateral 11/24/2021   Procedure: TOTAL LAPAROSCOPIC HYSTERECTOMY WITHBILATERAL SALPINGECTOMY,;  Surgeon: [name], MD;  Location: [facility];  Service: Gynecology;  Laterality: Bilateral;   TUBAL LIGATION  2004   wisdom teeth  12/20/1998   Social History   Social History Narrative   Not on file   Immunization History  Administered Date(s) Administered   Influenza,inj,Quad PF,6+ Mos 02/04/2016, 11/09/2016, 01/18/2018, 11/20/2018, 09/12/2019, 11/17/2020, 09/17/2021   Influenza-Unspecified 01/09/2014   PFIZER(Purple Top)SARS-COV-2 Vaccination 03/20/2020, 04/17/2020, 01/29/2021   PNEUMOCOCCAL CONJUGATE-20 09/17/2021   Tdap 04/25/2013     Objective: Vital Signs: BP 131/90 (BP Location: Left Arm, Patient Position: Sitting, Cuff Size: Normal)   Pulse 71   Ht 5' 3.5" (1.613 m)   Wt 219 lb 9.6 oz (99.6 kg)   LMP 11/04/2021   BMI 38.29 kg/m    Physical Exam Vitals and nursing note reviewed.  Constitutional:      Appearance: She is well-developed.  HENT:     Head: Normocephalic  and atraumatic.  Eyes:     Conjunctiva/sclera: Conjunctivae normal.  Cardiovascular:     Rate and Rhythm: Normal rate and regular rhythm.     Heart sounds: Normal heart sounds.  Pulmonary:     Effort: Pulmonary effort is normal.     Breath sounds: Normal breath sounds.  Abdominal:     General: Bowel sounds are normal.     Palpations: Abdomen is soft.  Musculoskeletal:     Cervical back: Normal range of motion.  Lymphadenopathy:     Cervical: No cervical adenopathy.  Skin:    General: Skin is warm and dry.     Capillary Refill: Capillary refill takes less than 2 seconds.  Neurological:     Mental Status: She is alert and oriented to person, place, and time.  Psychiatric:        Behavior: Behavior normal.  Musculoskeletal Exam: C-spine thoracic and lumbar spine were in good range of motion.  Shoulder joints, elbow joints, wrist joints, MCPs PIPs and DIPs with good range of motion.  She had mild thickening of PIP and DIP joints with no synovitis.  Hip joints and knee joints in good range of motion without any warmth swelling or effusion.  There was no tenderness over ankles or MTPs.  CDAI Exam: CDAI Score: -- Patient Global: --; Provider Global: -- Swollen: --; Tender: -- Joint Exam 06/25/2022   No joint exam has been documented for this visit   There is currently no information documented on the homunculus. Go to the Rheumatology activity and complete the homunculus joint exam.  Investigation: No additional findings.  Imaging: No results found.  Recent Labs: Lab Results  Component Value Date   WBC 7.5 12/04/2021   HGB 13.5 12/04/2021   PLT 334 12/04/2021   NA 135 11/25/2021   K 3.7 11/25/2021   CL 104 11/25/2021   CO2 23 11/25/2021   GLUCOSE 132 (H) 11/25/2021   BUN 10 11/25/2021   CREATININE 0.78 11/25/2021   BILITOT 0.4 06/26/2021   ALKPHOS 50 01/11/2020   AST 16 06/26/2021   ALT 17 06/26/2021   PROT 7.3 06/26/2021   ALBUMIN 4.2 01/11/2020   CALCIUM  9.3 11/25/2021   GFRAA 103 06/26/2021    Speciality Comments: No specialty comments available.  Procedures:  No procedures performed Allergies: Amlodipine   Assessment / Plan:     Visit Diagnoses: Positive ANA (antinuclear antibody) - AVISE: ANA 1: 320 speckled, ENA negative, CB CAP negative, antiphospholipid negative, beta-2 GP 1-, thyroid antibodies negative: Fatigue, hair loss. -She has noticed improvement in the hair loss.  She continues to have some fatigue.  She also gives history of dry eyes.  She denies history of dry mouth, Raynaud's phenomenon, lymphadenopathy or inflammatory arthritis.  I will obtain autoimmune labs today.  Over-the-counter products were discussed for dry eyes.  Plan: Protein / creatinine ratio, urine, CBC with Differential/Platelet, COMPLETE METABOLIC PANEL WITH GFR, ANA, Anti-DNA antibody, double-stranded, C3 and C4, Sedimentation rate  Primary osteoarthritis of both hands-joint protection muscle strengthening was discussed.  A handout on hand exercises was given.  Primary osteoarthritis of both knees-a handout on lower extremity exercises was given.  Vitamin D deficiency-her vitamin D was 19 on May 20, 2022.  I will place her on vitamin D 50,000 units once a week for 3 months.  Advised her to return in 3 months to get repeat vitamin D level.  She will take maintenance therapy with vitamin D 2000 units daily.  B12 deficiency-she takes vitamin D supplement intermittently.  Essential hypertension, benign-blood pressure was mildly elevated today.  Hyperlipidemia LDL goal <130  History of diabetes mellitus-patient states she was recently diagnosed with diabetes and started medication.  Dietary modifications were discussed.  Other insomnia-good sleep hygiene was discussed.  Orders: Orders Placed This Encounter  Procedures   Protein / creatinine ratio, urine   CBC with Differential/Platelet   COMPLETE METABOLIC PANEL WITH GFR   ANA   Anti-DNA antibody,  double-stranded   C3 and C4   Sedimentation rate   VITAMIN D 25 Hydroxy (Vit-D Deficiency, Fractures)   Meds ordered this encounter  Medications   Vitamin D, Ergocalciferol, (DRISDOL) 1.25 MG (50000 UNIT) CAPS capsule    Sig: Take 1 capsule (50,000 Units total) by mouth every 7 (seven) days.    Dispense:  12 capsule    Refill:  0  Follow-Up Instructions: Return in about 6 months (around 12/26/2022) for Osteoarthritis, +ANA.   Bo Merino, MD  Note - This record has been created using Editor, commissioning.  Chart creation errors have been sought, but may not always  have been located. Such creation errors do not reflect on  the standard of medical care.

## 2022-06-22 ENCOUNTER — Other Ambulatory Visit: Payer: Self-pay | Admitting: Internal Medicine

## 2022-06-22 DIAGNOSIS — E118 Type 2 diabetes mellitus with unspecified complications: Secondary | ICD-10-CM

## 2022-06-22 MED ORDER — TIRZEPATIDE 5 MG/0.5ML ~~LOC~~ SOAJ: 5.0000 mg | SUBCUTANEOUS | 0 refills | Status: DC

## 2022-06-25 ENCOUNTER — Encounter: Payer: Self-pay | Admitting: Rheumatology

## 2022-06-25 ENCOUNTER — Ambulatory Visit (INDEPENDENT_AMBULATORY_CARE_PROVIDER_SITE_OTHER): Payer: BC Managed Care – PPO | Admitting: Rheumatology

## 2022-06-25 VITALS — BP 131/90 | HR 71 | Ht 63.5 in | Wt 219.6 lb

## 2022-06-25 DIAGNOSIS — E559 Vitamin D deficiency, unspecified: Secondary | ICD-10-CM

## 2022-06-25 DIAGNOSIS — R768 Other specified abnormal immunological findings in serum: Secondary | ICD-10-CM

## 2022-06-25 DIAGNOSIS — Z8639 Personal history of other endocrine, nutritional and metabolic disease: Secondary | ICD-10-CM

## 2022-06-25 DIAGNOSIS — E785 Hyperlipidemia, unspecified: Secondary | ICD-10-CM

## 2022-06-25 DIAGNOSIS — R7303 Prediabetes: Secondary | ICD-10-CM

## 2022-06-25 DIAGNOSIS — M19041 Primary osteoarthritis, right hand: Secondary | ICD-10-CM | POA: Diagnosis not present

## 2022-06-25 DIAGNOSIS — I10 Essential (primary) hypertension: Secondary | ICD-10-CM

## 2022-06-25 DIAGNOSIS — G4709 Other insomnia: Secondary | ICD-10-CM

## 2022-06-25 DIAGNOSIS — M170 Bilateral primary osteoarthritis of knee: Secondary | ICD-10-CM | POA: Diagnosis not present

## 2022-06-25 DIAGNOSIS — M19042 Primary osteoarthritis, left hand: Secondary | ICD-10-CM

## 2022-06-25 DIAGNOSIS — E538 Deficiency of other specified B group vitamins: Secondary | ICD-10-CM

## 2022-06-25 MED ORDER — VITAMIN D (ERGOCALCIFEROL) 1.25 MG (50000 UNIT) PO CAPS: 50000.0000 [IU] | ORAL_CAPSULE | ORAL | 0 refills | Status: DC

## 2022-06-25 NOTE — Patient Instructions (Addendum)
Start the prescription for vitamin D 50,000 units 1 tablet once a week for 3 months.  After you finish the course of vitamin D stay on vitamin D 2000 units daily.  Please come in for repeat vitamin D level in 3 months.   Hand Exercises Hand exercises can be helpful for almost anyone. These exercises can strengthen the hands, improve flexibility and movement, and increase blood flow to the hands. These results can make work and daily tasks easier. Hand exercises can be especially helpful for people who have joint pain from arthritis or have nerve damage from overuse (carpal tunnel syndrome). These exercises can also help people who have injured a hand. Exercises Most of these hand exercises are gentle stretching and motion exercises. It is usually safe to do them often throughout the day. Warming up your hands before exercise may help to reduce stiffness. You can do this with gentle massage or by placing your hands in warm water for 10-15 minutes. It is normal to feel some stretching, pulling, tightness, or mild discomfort as you begin new exercises. This will gradually improve. Stop an exercise right away if you feel sudden, severe pain or your pain gets worse. Ask your health care provider which exercises are best for you. Knuckle bend or "claw" fist  Stand or sit with your arm, hand, and all five fingers pointed straight up. Make sure to keep your wrist straight during the exercise. Gently bend your fingers down toward your palm until the tips of your fingers are touching the top of your palm. Keep your big knuckle straight and just bend the small knuckles in your fingers. Hold this position for __________ seconds. Straighten (extend) your fingers back to the starting position. Repeat this exercise 5-10 times with each hand. Full finger fist  Stand or sit with your arm, hand, and all five fingers pointed straight up. Make sure to keep your wrist straight during the exercise. Gently bend your  fingers into your palm until the tips of your fingers are touching the middle of your palm. Hold this position for __________ seconds. Extend your fingers back to the starting position, stretching every joint fully. Repeat this exercise 5-10 times with each hand. Straight fist Stand or sit with your arm, hand, and all five fingers pointed straight up. Make sure to keep your wrist straight during the exercise. Gently bend your fingers at the big knuckle, where your fingers meet your hand, and the middle knuckle. Keep the knuckle at the tips of your fingers straight and try to touch the bottom of your palm. Hold this position for __________ seconds. Extend your fingers back to the starting position, stretching every joint fully. Repeat this exercise 5-10 times with each hand. Tabletop  Stand or sit with your arm, hand, and all five fingers pointed straight up. Make sure to keep your wrist straight during the exercise. Gently bend your fingers at the big knuckle, where your fingers meet your hand, as far down as you can while keeping the small knuckles in your fingers straight. Think of forming a tabletop with your fingers. Hold this position for __________ seconds. Extend your fingers back to the starting position, stretching every joint fully. Repeat this exercise 5-10 times with each hand. Finger spread  Place your hand flat on a table with your palm facing down. Make sure your wrist stays straight as you do this exercise. Spread your fingers and thumb apart from each other as far as you can until you feel  a gentle stretch. Hold this position for __________ seconds. Bring your fingers and thumb tight together again. Hold this position for __________ seconds. Repeat this exercise 5-10 times with each hand. Making circles  Stand or sit with your arm, hand, and all five fingers pointed straight up. Make sure to keep your wrist straight during the exercise. Make a circle by touching the tip of  your thumb to the tip of your index finger. Hold for __________ seconds. Then open your hand wide. Repeat this motion with your thumb and each finger on your hand. Repeat this exercise 5-10 times with each hand. Thumb motion  Sit with your forearm resting on a table and your wrist straight. Your thumb should be facing up toward the ceiling. Keep your fingers relaxed as you move your thumb. Lift your thumb up as high as you can toward the ceiling. Hold for __________ seconds. Bend your thumb across your palm as far as you can, reaching the tip of your thumb for the small finger (pinkie) side of your palm. Hold for __________ seconds. Repeat this exercise 5-10 times with each hand. Grip strengthening  Hold a stress ball or other soft ball in the middle of your hand. Slowly increase the pressure, squeezing the ball as much as you can without causing pain. Think of bringing the tips of your fingers into the middle of your palm. All of your finger joints should bend when doing this exercise. Hold your squeeze for __________ seconds, then relax. Repeat this exercise 5-10 times with each hand. Contact a health care provider if: Your hand pain or discomfort gets much worse when you do an exercise. Your hand pain or discomfort does not improve within 2 hours after you exercise. If you have any of these problems, stop doing these exercises right away. Do not do them again unless your health care provider says that you can. Get help right away if: You develop sudden, severe hand pain or swelling. If this happens, stop doing these exercises right away. Do not do them again unless your health care provider says that you can. This information is not intended to replace advice given to you by your health care provider. Make sure you discuss any questions you have with your health care provider. Document Revised: 03/26/2021 Document Reviewed: 03/26/2021 Elsevier Patient Education  [place]. Knee  Exercises Ask your health care provider which exercises are safe for you. Do exercises exactly as told by your health care provider and adjust them as directed. It is normal to feel mild stretching, pulling, tightness, or discomfort as you do these exercises. Stop right away if you feel sudden pain or your pain gets worse. Do not begin these exercises until told by your health care provider. Stretching and range-of-motion exercises These exercises warm up your muscles and joints and improve the movement and flexibility of your knee. These exercises also help to relieve pain and swelling. Knee extension, prone  Lie on your abdomen (prone position) on a bed. Place your left / right knee just beyond the edge of the surface so your knee is not on the bed. You can put a towel under your left / right thigh just above your kneecap for comfort. Relax your leg muscles and allow gravity to straighten your knee (extension). You should feel a stretch behind your left / right knee. Hold this position for __________ seconds. Scoot up so your knee is supported between repetitions. Repeat __________ times. Complete this exercise __________ times  a day. Knee flexion, active  Lie on your back with both legs straight. If this causes back discomfort, bend your left / right knee so your foot is flat on the floor. Slowly slide your left / right heel back toward your buttocks. Stop when you feel a gentle stretch in the front of your knee or thigh (flexion). Hold this position for __________ seconds. Slowly slide your left / right heel back to the starting position. Repeat __________ times. Complete this exercise __________ times a day. Quadriceps stretch, prone  Lie on your abdomen on a firm surface, such as a bed or padded floor. Bend your left / right knee and hold your ankle. If you cannot reach your ankle or pant leg, loop a belt around your foot and grab the belt instead. Gently pull your heel toward your  buttocks. Your knee should not slide out to the side. You should feel a stretch in the front of your thigh and knee (quadriceps). Hold this position for __________ seconds. Repeat __________ times. Complete this exercise __________ times a day. Hamstring, supine  Lie on your back (supine position). Loop a belt or towel over the ball of your left / right foot. The ball of your foot is on the walking surface, right under your toes. Straighten your left / right knee and slowly pull on the belt to raise your leg until you feel a gentle stretch behind your knee (hamstring). Do not let your knee bend while you do this. Keep your other leg flat on the floor. Hold this position for __________ seconds. Repeat __________ times. Complete this exercise __________ times a day. Strengthening exercises These exercises build strength and endurance in your knee. Endurance is the ability to use your muscles for a long time, even after they get tired. Quadriceps, isometric This exercise strengthens the muscles in front of your thigh (quadriceps) without moving your knee joint (isometric). Lie on your back with your left / right leg extended and your other knee bent. Put a rolled towel or small pillow under your knee if told by your health care provider. Slowly tense the muscles in the front of your left / right thigh. You should see your kneecap slide up toward your hip or see increased dimpling just above the knee. This motion will push the back of the knee toward the floor. For __________ seconds, hold the muscle as tight as you can without increasing your pain. Relax the muscles slowly and completely. Repeat __________ times. Complete this exercise __________ times a day. Straight leg raises This exercise strengthens the muscles in front of your thigh (quadriceps) and the muscles that move your hips (hip flexors). Lie on your back with your left / right leg extended and your other knee bent. Tense the  muscles in the front of your left / right thigh. You should see your kneecap slide up or see increased dimpling just above the knee. Your thigh may even shake a bit. Keep these muscles tight as you raise your leg 4-6 inches (10-15 cm) off the floor. Do not let your knee bend. Hold this position for __________ seconds. Keep these muscles tense as you lower your leg. Relax your muscles slowly and completely after each repetition. Repeat __________ times. Complete this exercise __________ times a day. Hamstring, isometric  Lie on your back on a firm surface. Bend your left / right knee about __________ degrees. Dig your left / right heel into the surface as if you are trying to  pull it toward your buttocks. Tighten the muscles in the back of your thighs (hamstring) to "dig" as hard as you can without increasing any pain. Hold this position for __________ seconds. Release the tension gradually and allow your muscles to relax completely for __________ seconds after each repetition. Repeat __________ times. Complete this exercise __________ times a day. Hamstring curls If told by your health care provider, do this exercise while wearing ankle weights. Begin with __________lb / kg weights. Then increase the weight by 1 lb (0.5 kg) increments. Do not wear ankle weights that are more than __________lb / kg. Lie on your abdomen with your legs straight. Bend your left / right knee as far as you can without feeling pain. Keep your hips flat against the floor. Hold this position for __________ seconds. Slowly lower your leg to the starting position. Repeat __________ times. Complete this exercise __________ times a day. Squats This exercise strengthens the muscles in front of your thigh and knee (quadriceps). Stand in front of a table, with your feet and knees pointing straight ahead. You may rest your hands on the table for balance but not for support. Slowly bend your knees and lower your hips like you  are going to sit in a chair. Keep your weight over your heels, not over your toes. Keep your lower legs upright so they are parallel with the table legs. Do not let your hips go lower than your knees. Do not bend lower than told by your health care provider. If your knee pain increases, do not bend as low. Hold the squat position for __________ seconds. Slowly push with your legs to return to standing. Do not use your hands to pull yourself to standing. Repeat __________ times. Complete this exercise __________ times a day. Wall slides This exercise strengthens the muscles in front of your thigh and knee (quadriceps). Lean your back against a smooth wall or door, and walk your feet out 18-24 inches (46-61 cm) from it. Place your feet hip-width apart. Slowly slide down the wall or door until your knees bend __________ degrees. Keep your knees over your heels, not over your toes. Keep your knees in line with your hips. Hold this position for __________ seconds. Repeat __________ times. Complete this exercise __________ times a day. Straight leg raises, side-lying This exercise strengthens the muscles that rotate the leg at the hip and move it away from your body (hip abductors). Lie on your side with your left / right leg in the top position. Lie so your head, shoulder, knee, and hip line up. You may bend your bottom knee to help you keep your balance. Roll your hips slightly forward so your hips are stacked directly over each other and your left / right knee is facing forward. Leading with your heel, lift your top leg 4-6 inches (10-15 cm). You should feel the muscles in your outer hip lifting. Do not let your foot drift forward. Do not let your knee roll toward the ceiling. Hold this position for __________ seconds. Slowly return your leg to the starting position. Let your muscles relax completely after each repetition. Repeat __________ times. Complete this exercise __________ times a  day. Straight leg raises, prone This exercise stretches the muscles that move your hips away from the front of the pelvis (hip extensors). Lie on your abdomen on a firm surface. You can put a pillow under your hips if that is more comfortable. Tense the muscles in your buttocks and lift your  left / right leg about 4-6 inches (10-15 cm). Keep your knee straight as you lift your leg. Hold this position for __________ seconds. Slowly lower your leg to the starting position. Let your leg relax completely after each repetition. Repeat __________ times. Complete this exercise __________ times a day. This information is not intended to replace advice given to you by your health care provider. Make sure you discuss any questions you have with your health care provider. Document Revised: 08/18/2021 Document Reviewed: 08/18/2021 Elsevier Patient Education  [place].

## 2022-06-29 LAB — ANTI-NUCLEAR AB-TITER (ANA TITER): ANA Titer 1: 1:320 {titer} — ABNORMAL HIGH

## 2022-06-29 LAB — COMPLETE METABOLIC PANEL WITH GFR
AG Ratio: 1.5 (calc) (ref 1.0–2.5)
ALT: 24 U/L (ref 6–29)
AST: 18 U/L (ref 10–35)
Albumin: 4.9 g/dL (ref 3.6–5.1)
Alkaline phosphatase (APISO): 60 U/L (ref 31–125)
BUN: 9 mg/dL (ref 7–25)
CO2: 24 mmol/L (ref 20–32)
Calcium: 10.8 mg/dL — ABNORMAL HIGH (ref 8.6–10.2)
Chloride: 106 mmol/L (ref 98–110)
Creat: 0.93 mg/dL (ref 0.50–0.99)
Globulin: 3.2 g/dL (calc) (ref 1.9–3.7)
Glucose, Bld: 92 mg/dL (ref 65–99)
Potassium: 4.6 mmol/L (ref 3.5–5.3)
Sodium: 137 mmol/L (ref 135–146)
Total Bilirubin: 0.5 mg/dL (ref 0.2–1.2)
Total Protein: 8.1 g/dL (ref 6.1–8.1)
eGFR: 77 mL/min/{1.73_m2} (ref >=60)

## 2022-06-29 LAB — CBC WITH DIFFERENTIAL/PLATELET
Absolute Monocytes: 540 cells/uL (ref 200–950)
Basophils Absolute: 38 cells/uL (ref 0–200)
Basophils Relative: 0.5 %
Eosinophils Absolute: 84 cells/uL (ref 15–500)
Eosinophils Relative: 1.1 %
HCT: 43 % (ref 35.0–45.0)
Hemoglobin: 13.8 g/dL (ref 11.7–15.5)
Lymphs Abs: 2675 cells/uL (ref 850–3900)
MCH: 26.6 pg — ABNORMAL LOW (ref 27.0–33.0)
MCHC: 32.1 g/dL (ref 32.0–36.0)
MCV: 83 fL (ref 80.0–100.0)
MPV: 10.4 fL (ref 7.5–12.5)
Monocytes Relative: 7.1 %
Neutro Abs: 4264 cells/uL (ref 1500–7800)
Neutrophils Relative %: 56.1 %
Platelets: 305 10*3/uL (ref 140–400)
RBC: 5.18 10*6/uL — ABNORMAL HIGH (ref 3.80–5.10)
RDW: 14.8 % (ref 11.0–15.0)
Total Lymphocyte: 35.2 %
WBC: 7.6 10*3/uL (ref 3.8–10.8)

## 2022-06-29 LAB — PROTEIN / CREATININE RATIO, URINE
Creatinine, Urine: 123 mg/dL (ref 20–275)
Protein/Creat Ratio: 98 mg/g creat (ref 24–184)
Protein/Creatinine Ratio: 0.098 mg/mg creat (ref 0.024–0.184)
Total Protein, Urine: 12 mg/dL (ref 5–24)

## 2022-06-29 LAB — ANTI-DNA ANTIBODY, DOUBLE-STRANDED: ds DNA Ab: <1 IU/mL

## 2022-06-29 LAB — SEDIMENTATION RATE: Sed Rate: 2 mm/h (ref 0–20)

## 2022-06-29 LAB — ANA: Anti Nuclear Antibody (ANA): POSITIVE — AB

## 2022-06-29 LAB — C3 AND C4
C3 Complement: 175 mg/dL (ref 83–193)
C4 Complement: 60 mg/dL — ABNORMAL HIGH (ref 15–57)

## 2022-06-29 NOTE — Progress Notes (Signed)
CBC normal, CMP normal except mildly elevated calcium.  Please advise patient to avoid calcium supplement.  ANA 1: 320 speckled, complements normal, double-stranded ENA negative, sed rate normal.  Labs do not indicate active autoimmune disease.  No change in treatment advised.

## 2022-07-22 ENCOUNTER — Other Ambulatory Visit: Payer: Self-pay | Admitting: Internal Medicine

## 2022-07-22 DIAGNOSIS — E876 Hypokalemia: Secondary | ICD-10-CM

## 2022-07-22 DIAGNOSIS — I10 Essential (primary) hypertension: Secondary | ICD-10-CM

## 2022-07-25 ENCOUNTER — Other Ambulatory Visit: Payer: Self-pay | Admitting: Internal Medicine

## 2022-07-25 DIAGNOSIS — I10 Essential (primary) hypertension: Secondary | ICD-10-CM

## 2022-08-01 ENCOUNTER — Other Ambulatory Visit: Payer: Self-pay | Admitting: Rheumatology

## 2022-08-19 ENCOUNTER — Other Ambulatory Visit: Payer: Self-pay | Admitting: Internal Medicine

## 2022-08-19 DIAGNOSIS — N390 Urinary tract infection, site not specified: Secondary | ICD-10-CM

## 2022-08-22 ENCOUNTER — Other Ambulatory Visit: Payer: Self-pay | Admitting: Internal Medicine

## 2022-08-22 DIAGNOSIS — K2100 Gastro-esophageal reflux disease with esophagitis, without bleeding: Secondary | ICD-10-CM

## 2022-09-09 ENCOUNTER — Ambulatory Visit (INDEPENDENT_AMBULATORY_CARE_PROVIDER_SITE_OTHER): Payer: BC Managed Care – PPO | Admitting: Nurse Practitioner

## 2022-09-09 ENCOUNTER — Encounter: Payer: Self-pay | Admitting: Nurse Practitioner

## 2022-09-09 ENCOUNTER — Telehealth: Payer: Self-pay

## 2022-09-09 VITALS — BP 108/78 | HR 74 | Ht 64.25 in | Wt 201.0 lb

## 2022-09-09 DIAGNOSIS — R300 Dysuria: Secondary | ICD-10-CM | POA: Diagnosis not present

## 2022-09-09 DIAGNOSIS — N390 Urinary tract infection, site not specified: Secondary | ICD-10-CM | POA: Diagnosis not present

## 2022-09-09 DIAGNOSIS — B3731 Acute candidiasis of vulva and vagina: Secondary | ICD-10-CM

## 2022-09-09 DIAGNOSIS — Z01419 Encounter for gynecological examination (general) (routine) without abnormal findings: Secondary | ICD-10-CM

## 2022-09-09 DIAGNOSIS — N3000 Acute cystitis without hematuria: Secondary | ICD-10-CM

## 2022-09-09 MED ORDER — FLUCONAZOLE 150 MG PO TABS: 150.0000 mg | ORAL_TABLET | ORAL | 0 refills | Status: DC

## 2022-09-09 MED ORDER — SULFAMETHOXAZOLE-TRIMETHOPRIM 800-160 MG PO TABS: 1.0000 | ORAL_TABLET | Freq: Two times a day (BID) | ORAL | 0 refills | Status: DC

## 2022-09-09 NOTE — Telephone Encounter (Signed)
Referral faxed to [facility].   I spoke with patient and let her know referral faxed and they will call her or she can call them. Phone number provided.

## 2022-09-09 NOTE — Addendum Note (Signed)
Addended byMarny Lowenstein on: 09/09/2022 03:58 PM   Modules accepted: Orders

## 2022-09-09 NOTE — Progress Notes (Addendum)
Samantha Ball 02-19-1976 [id number]   History:  46 y.o. G3P2 presents for annual exam. S/P 11/2021 TLH with bilateral salpingectomies for menorrhagia/fibroids. Normal pap history. T2DM, HTN, HLD managed by PCP. Reports dysuria, frequency, urine odor, and back pain. Was on Methenamine in the past for recurrent UTI symptoms, prescribed by PCP. Stopped 1 month ago per PCP request to see how she would do without it. Reports symptoms began soon after stopping.   Gynecologic History Patient's last menstrual period was 11/04/2021.   Contraception/Family planning: tubal ligation Sexually active: Yes  Health Maintenance Last Pap: 09/08/2021. Results were: Normal neg HPV Last mammogram: 03/12/2022. Results were: Left asymmetry, normal follow up imaging Last colonoscopy: 04/09/2018. Results were: Normal, 10-year recall Last Dexa: Not indicated  Past medical history, past surgical history, family history and social history were all reviewed and documented in the EPIC chart. Married. 22 yo daughter, freshman at [employer], commuting. 64 yo son, left college, living at home, working.   ROS:  A ROS was performed and pertinent positives and negatives are included.  Exam:  Vitals:   09/09/22 1529  BP: 108/78  Pulse: 74  SpO2: 99%  Weight: 201 lb (91.2 kg)  Height: 5' 4.25" (1.632 m)    Body mass index is 34.23 kg/m.  General appearance:  Normal Thyroid:  Symmetrical, normal in size, without palpable masses or nodularity. Respiratory  Auscultation:  Clear without wheezing or rhonchi Cardiovascular  Auscultation:  Regular rate, without rubs, murmurs or gallops  Edema/varicosities:  Not grossly evident Abdominal  Soft,nontender, without masses, guarding or rebound.  Liver/spleen:  No organomegaly noted  Hernia:  None appreciated  Skin  Inspection:  Grossly normal Breasts: Examined lying and sitting.   Right: Without masses, retractions, nipple discharge or axillary  adenopathy.   Left: Without masses, retractions, nipple discharge or axillary adenopathy. Genitourinary   Inguinal/mons:  Normal without inguinal adenopathy  External genitalia:  Normal appearing vulva with no masses, tenderness, or lesions  BUS/Urethra/Skene's glands:  Normal  Vagina:  Normal appearing with normal color and discharge, no lesions  Cervix:  and uterus absent  Adnexa/parametria:     Rt: Normal in size, without masses or tenderness.   Lt: Normal in size, without masses or tenderness.  Anus and perineum: Non-bleeding hemorrhoids  Digital rectal exam: Normal sphincter tone without palpated masses or tenderness  Patient informed chaperone available to be present for breast and pelvic exam. Patient has requested no chaperone to be present. Patient has been advised what will be completed during breast and pelvic exam.   UA: 2+ leukocytes, trace blood, nitrite positive, yellow/slightly cloudy. Microscopic: wbc 10-20, rbc 02, yeast present  Assessment/Plan:  46 y.o. G3P2 to establish care and discuss fibroids.   Well female exam with routine gynecological exam - Education provided on SBEs, importance of preventative screenings, current guidelines, high calcium diet, regular exercise, and multivitamin daily. Labs with PCP.   Dysuria - Plan: Urinalysis,Complete w/RFL Culture  Acute cystitis without hematuria - Plan: sulfamethoxazole-trimethoprim (BACTRIM DS) 800-160 MG tablet BID x 3 days. Culture pending.   Recurrent UTI - Reports dysuria, frequency, urine odor, and back pain. Was on Methenamine in the past for recurrent UTI symptoms, prescribed by PCP. Stopped 1 month ago per PCP request to see how she would do without it. Reports symptoms began soon after stopping. Urology referral recommended and she is agreeable.   Vaginal candidiasis - Plan: fluconazole (DIFLUCAN) 150 MG tablet today and repeat in 3 days for total of  2 doses.   Screening for cervical cancer - Normal Pap  history. No longer screening per guidelines.   Screening for breast cancer - Normal mammogram history.  Continue annual screenings.  Normal breast exam today.  Screening for colon cancer - 2019 colonoscopy. Will repeat at GI's recommended interval.   Return in 1 year for annual.     Tamela Gammon DNP, 3:54 PM 09/09/2022

## 2022-09-12 LAB — URINALYSIS, COMPLETE W/RFL CULTURE
Bilirubin Urine: NEGATIVE
Glucose, UA: NEGATIVE
Hyaline Cast: NONE SEEN /LPF
Ketones, ur: NEGATIVE
Nitrites, Initial: POSITIVE — AB
Protein, ur: NEGATIVE
Specific Gravity, Urine: 1.015 (ref 1.001–1.035)
pH: 5.5 (ref 5.0–8.0)

## 2022-09-12 LAB — URINE CULTURE
MICRO NUMBER:: 13949854
SPECIMEN QUALITY:: ADEQUATE

## 2022-09-12 LAB — CULTURE INDICATED

## 2022-09-13 ENCOUNTER — Other Ambulatory Visit: Payer: Self-pay | Admitting: Internal Medicine

## 2022-09-13 ENCOUNTER — Other Ambulatory Visit: Payer: Self-pay | Admitting: Rheumatology

## 2022-09-13 DIAGNOSIS — E118 Type 2 diabetes mellitus with unspecified complications: Secondary | ICD-10-CM

## 2022-09-14 ENCOUNTER — Other Ambulatory Visit: Payer: Self-pay | Admitting: Internal Medicine

## 2022-09-14 DIAGNOSIS — E118 Type 2 diabetes mellitus with unspecified complications: Secondary | ICD-10-CM

## 2022-09-14 MED ORDER — TIRZEPATIDE 7.5 MG/0.5ML ~~LOC~~ SOAJ: 7.5000 mg | SUBCUTANEOUS | 0 refills | Status: DC

## 2022-09-19 ENCOUNTER — Other Ambulatory Visit: Payer: Self-pay | Admitting: Internal Medicine

## 2022-09-22 NOTE — Telephone Encounter (Signed)
Per Maudie Mercury at [facility] she called patient and LM to call on 09/21/22.

## 2022-10-17 ENCOUNTER — Other Ambulatory Visit: Payer: Self-pay | Admitting: Rheumatology

## 2022-10-18 DIAGNOSIS — N3020 Other chronic cystitis without hematuria: Secondary | ICD-10-CM | POA: Diagnosis not present

## 2022-10-18 DIAGNOSIS — K5904 Chronic idiopathic constipation: Secondary | ICD-10-CM | POA: Diagnosis not present

## 2022-10-18 DIAGNOSIS — N390 Urinary tract infection, site not specified: Secondary | ICD-10-CM | POA: Diagnosis not present

## 2022-10-18 DIAGNOSIS — B9620 Unspecified Escherichia coli [E. coli] as the cause of diseases classified elsewhere: Secondary | ICD-10-CM | POA: Diagnosis not present

## 2022-10-20 ENCOUNTER — Other Ambulatory Visit: Payer: Self-pay | Admitting: Internal Medicine

## 2022-10-20 DIAGNOSIS — I10 Essential (primary) hypertension: Secondary | ICD-10-CM

## 2022-10-20 DIAGNOSIS — T502X5A Adverse effect of carbonic-anhydrase inhibitors, benzothiadiazides and other diuretics, initial encounter: Secondary | ICD-10-CM

## 2022-11-02 ENCOUNTER — Other Ambulatory Visit: Payer: Self-pay | Admitting: Internal Medicine

## 2022-11-02 DIAGNOSIS — I10 Essential (primary) hypertension: Secondary | ICD-10-CM

## 2022-11-18 ENCOUNTER — Other Ambulatory Visit: Payer: Self-pay | Admitting: Internal Medicine

## 2022-11-18 DIAGNOSIS — E785 Hyperlipidemia, unspecified: Secondary | ICD-10-CM

## 2022-11-22 DIAGNOSIS — M6281 Muscle weakness (generalized): Secondary | ICD-10-CM | POA: Diagnosis not present

## 2022-11-22 DIAGNOSIS — M6289 Other specified disorders of muscle: Secondary | ICD-10-CM | POA: Diagnosis not present

## 2022-11-22 DIAGNOSIS — R350 Frequency of micturition: Secondary | ICD-10-CM | POA: Diagnosis not present

## 2022-12-03 ENCOUNTER — Other Ambulatory Visit: Payer: Self-pay | Admitting: Internal Medicine

## 2022-12-03 DIAGNOSIS — E118 Type 2 diabetes mellitus with unspecified complications: Secondary | ICD-10-CM

## 2022-12-12 ENCOUNTER — Other Ambulatory Visit: Payer: Self-pay | Admitting: Internal Medicine

## 2022-12-12 DIAGNOSIS — E118 Type 2 diabetes mellitus with unspecified complications: Secondary | ICD-10-CM

## 2022-12-12 DIAGNOSIS — J3089 Other allergic rhinitis: Secondary | ICD-10-CM

## 2022-12-14 MED ORDER — TIRZEPATIDE 7.5 MG/0.5ML ~~LOC~~ SOAJ: 7.5000 mg | SUBCUTANEOUS | 0 refills | Status: DC

## 2022-12-21 NOTE — Progress Notes (Signed)
Office Visit Note  Patient: Samantha Ball             Date of Birth: Dec 01, 1976           MRN: [id number]             PCP: [name], MD Referring: [name], MD Visit Date: 12/28/2022 Occupation: '@GUAROCC'$ @  Subjective:  Fatigue and joint pain  History of Present Illness: Samantha Ball is a 47 y.o. female with history of positive ANA and osteoarthritis.  She continues to have some discomfort in her bilateral hands and knee joints.  She has not noticed any joint swelling.  She denies any history of oral ulcers, nasal ulcers, Raynaud's phenomenon, lymphadenopathy, photosensitivity, inflammatory arthritis.  She has some dry eye symptoms.  She denies any history of palpitations or shortness of breath.  She continues to have some fatigue.    Activities of Daily Living:  Patient reports morning stiffness for 10-15 minutes.   Patient Denies nocturnal pain.  Difficulty dressing/grooming: Denies Difficulty climbing stairs: Denies Difficulty getting out of chair: Denies Difficulty using hands for taps, buttons, cutlery, and/or writing: Denies  Review of Systems  Constitutional:  Positive for fatigue.  HENT:  Negative for mouth sores and mouth dryness.   Eyes:  Positive for dryness.  Respiratory:  Negative for shortness of breath.   Cardiovascular:  Negative for chest pain and palpitations.  Gastrointestinal:  Positive for blood in stool. Negative for constipation and diarrhea.       Hemorrhoids per patient  Endocrine: Negative for increased urination.  Genitourinary:  Negative for involuntary urination.  Musculoskeletal:  Positive for joint pain, joint pain, muscle weakness and morning stiffness. Negative for gait problem, joint swelling, myalgias, muscle tenderness and myalgias.  Skin:  Negative for color change, rash and sensitivity to sunlight.  Allergic/Immunologic: Negative for susceptible to infections.  Neurological:  Positive for headaches. Negative for  dizziness.  Hematological:  Negative for swollen glands.  Psychiatric/Behavioral:  Positive for sleep disturbance. Negative for depressed mood. The patient is not nervous/anxious.     PMFS History:  Patient Active Problem List   Diagnosis Date Noted   Dysuria 05/20/2022   Status post laparoscopic hysterectomy 11/24/2021   Cervical cancer screening 05/13/2021   Type II diabetes mellitus with manifestations ([place]) 05/13/2021   Onychomycosis 04/27/2021   Sensorineural hearing loss 04/27/2021   Migraine without aura and without status migrainosus, not intractable 11/17/2020   Routine general medical examination at a health care facility 04/01/2020   Intrinsic eczema 04/01/2020   Arthralgia 02/01/2019   Vitamin D deficiency disease 11/21/2018   B12 deficiency 11/21/2018   Perennial allergic rhinitis 03/06/2018   Seasonal allergic rhinitis due to pollen 05/09/2017   Hyperlipidemia LDL goal <130 12/21/2016   Snoring 11/09/2016   Other iron deficiency anemias 12/17/2014   GERD (gastroesophageal reflux disease) 01/09/2014   Constipation 01/09/2014   Positive ANA (antinuclear antibody) 07/16/2013   Essential hypertension, benign 07/11/2013   Visit for screening mammogram 07/11/2013   Obesity (BMI 30.0-34.9) 07/11/2013    Past Medical History:  Diagnosis Date   Anemia    Arthritis    oa   DM Type 2    diet controlled   Fibroids    Dr. Charlesetta Garibaldi, per patient.    GERD (gastroesophageal reflux disease)    Hyperlipidemia    Hypertension    Internal hemorrhoid    Intrinsic eczema 04/01/2020   migraine    Obesity  Sleep apnea    not currently using CPAP did not tolerate cpap   Urticaria    Vitamin B deficiency    Wears glasses 11/17/2021   for reading   Wears hearing aid in both ears 11/17/2021    Family History  Problem Relation Age of Onset   Heart disease Father    Allergic rhinitis Daughter    Eczema Daughter    Breast cancer Maternal Aunt    Diabetes Maternal Aunt     Breast cancer Maternal Grandmother    Breast cancer Cousin    Alcohol abuse Neg Hx    COPD Neg Hx    Depression Neg Hx    Drug abuse Neg Hx    Early death Neg Hx    Hearing loss Neg Hx    Hyperlipidemia Neg Hx    Hypertension Neg Hx    Kidney disease Neg Hx    Stroke Neg Hx    Colon cancer Neg Hx    Rectal cancer Neg Hx    Stomach cancer Neg Hx    Past Surgical History:  Procedure Laterality Date   colonscopy     x 2 last done 2021 or 2019   CYSTOSCOPY N/A 11/24/2021   Procedure: CYSTOSCOPY;  Surgeon: [name], MD;  Location: [facility];  Service: Gynecology;  Laterality: N/A;   DILATION AND CURETTAGE OF UTERUS N/A 11/19/2000   TOTAL LAPAROSCOPIC HYSTERECTOMY WITH SALPINGECTOMY Bilateral 11/24/2021   Procedure: TOTAL LAPAROSCOPIC HYSTERECTOMY WITHBILATERAL SALPINGECTOMY,;  Surgeon: [name], MD;  Location: [facility];  Service: Gynecology;  Laterality: Bilateral;   TUBAL LIGATION  2004   wisdom teeth  12/20/1998   Social History   Social History Narrative   Not on file   Immunization History  Administered Date(s) Administered   Influenza,inj,Quad PF,6+ Mos 02/04/2016, 11/09/2016, 01/18/2018, 11/20/2018, 09/12/2019, 11/17/2020, 09/17/2021   Influenza-Unspecified 01/09/2014   PFIZER(Purple Top)SARS-COV-2 Vaccination 03/20/2020, 04/17/2020, 01/29/2021   PNEUMOCOCCAL CONJUGATE-20 09/17/2021   Tdap 04/25/2013     Objective: Vital Signs: BP 112/85 (BP Location: Left Arm, Patient Position: Sitting, Cuff Size: Normal)   Pulse 69   Resp 14   Ht 5' 3.5" (1.613 m)   Wt 190 lb 12.8 oz (86.5 kg)   LMP 11/04/2021   BMI 33.27 kg/m    Physical Exam Vitals and nursing note reviewed.  Constitutional:      Appearance: She is well-developed.  HENT:     Head: Normocephalic and atraumatic.  Eyes:     Conjunctiva/sclera: Conjunctivae normal.  Cardiovascular:     Rate and Rhythm: Normal rate and regular rhythm.      Heart sounds: Normal heart sounds.  Pulmonary:     Effort: Pulmonary effort is normal.     Breath sounds: Normal breath sounds.  Abdominal:     General: Bowel sounds are normal.     Palpations: Abdomen is soft.  Musculoskeletal:     Cervical back: Normal range of motion.  Lymphadenopathy:     Cervical: No cervical adenopathy.  Skin:    General: Skin is warm and dry.     Capillary Refill: Capillary refill takes less than 2 seconds.  Neurological:     Mental Status: She is alert and oriented to person, place, and time.  Psychiatric:        Behavior: Behavior normal.      Musculoskeletal Exam: Cervical, thoracic and lumbar spine were in good range of motion.  Shoulder joints, elbow  joints, wrist joints, MCPs PIPs and DIPs Juengel range of motion with no synovitis.  Hip joints, knee joints, ankles, MTPs and PIPs been good range of motion with no synovitis.  CDAI Exam: CDAI Score: -- Patient Global: --; Provider Global: -- Swollen: --; Tender: -- Joint Exam 12/28/2022   No joint exam has been documented for this visit   There is currently no information documented on the homunculus. Go to the Rheumatology activity and complete the homunculus joint exam.  Investigation: No additional findings.  Imaging: No results found.  Recent Labs: Lab Results  Component Value Date   WBC 7.6 06/25/2022   HGB 13.8 06/25/2022   PLT 305 06/25/2022   NA 137 06/25/2022   K 4.6 06/25/2022   CL 106 06/25/2022   CO2 24 06/25/2022   GLUCOSE 92 06/25/2022   BUN 9 06/25/2022   CREATININE 0.93 06/25/2022   BILITOT 0.5 06/25/2022   ALKPHOS 50 01/11/2020   AST 18 06/25/2022   ALT 24 06/25/2022   PROT 8.1 06/25/2022   ALBUMIN 4.2 01/11/2020   CALCIUM 10.8 (H) 06/25/2022   GFRAA 103 06/26/2021    Speciality Comments: No specialty comments available.  Procedures:  No procedures performed Allergies: Amlodipine   Assessment / Plan:     Visit Diagnoses: Positive ANA (antinuclear  antibody) - AVISE: ANA 1: 320 speckled, ENA negative, CB CAP negative, antiphospholipid negative, beta-2 GP 1-, thyroid antibodies negative: -She has positive ANA and history of joint pain.  All other autoimmune antibodies were negative.  She denies any history of oral ulcers, nasal ulcers, malar rash, photosensitivity, Raynaud's phenomenon or lymphadenopathy.  She continues to have some fatigue and dry eyes.  I will obtain autoimmune labs today.  If labs are stable we will check labs only once a year if needed.  Plan: Protein / creatinine ratio, urine, CBC with Differential/Platelet, COMPLETE METABOLIC PANEL WITH GFR, Anti-DNA antibody, double-stranded, C3 and C4, Sedimentation rate, ANA  Primary osteoarthritis of both hands-she complains of pain and stiffness in her bilateral hands.  No synovitis was noted.  Clinical findings are consistent with osteoarthritis.  Joint protection was discussed.  Primary osteoarthritis of both knees-lower extremity muscle strength exercises were discussed.  She continues to have some discomfort in her knee joints off-and-on.  No joint swelling or warmth was noted.  Vitamin D deficiency-she is on vitamin D supplement.  B12 deficiency-she takes beet well.  Essential hypertension, benign-blood pressure is normal.  Hyperlipidemia LDL goal <130  History of diabetes mellitus  Other insomnia  Orders: Orders Placed This Encounter  Procedures   Protein / creatinine ratio, urine   CBC with Differential/Platelet   COMPLETE METABOLIC PANEL WITH GFR   Anti-DNA antibody, double-stranded   C3 and C4   Sedimentation rate   ANA   No orders of the defined types were placed in this encounter.    Follow-Up Instructions: No follow-ups on file.   Bo Merino, MD  Note - This record has been created using Editor, commissioning.  Chart creation errors have been sought, but may not always  have been located. Such creation errors do not reflect on  the standard of  medical care.

## 2022-12-28 ENCOUNTER — Ambulatory Visit: Payer: BC Managed Care – PPO | Attending: Rheumatology | Admitting: Rheumatology

## 2022-12-28 ENCOUNTER — Encounter: Payer: Self-pay | Admitting: Rheumatology

## 2022-12-28 VITALS — BP 112/85 | HR 69 | Resp 14 | Ht 63.5 in | Wt 190.8 lb

## 2022-12-28 DIAGNOSIS — E785 Hyperlipidemia, unspecified: Secondary | ICD-10-CM

## 2022-12-28 DIAGNOSIS — M170 Bilateral primary osteoarthritis of knee: Secondary | ICD-10-CM | POA: Diagnosis not present

## 2022-12-28 DIAGNOSIS — Z8639 Personal history of other endocrine, nutritional and metabolic disease: Secondary | ICD-10-CM

## 2022-12-28 DIAGNOSIS — G4709 Other insomnia: Secondary | ICD-10-CM

## 2022-12-28 DIAGNOSIS — E559 Vitamin D deficiency, unspecified: Secondary | ICD-10-CM

## 2022-12-28 DIAGNOSIS — E538 Deficiency of other specified B group vitamins: Secondary | ICD-10-CM

## 2022-12-28 DIAGNOSIS — M19042 Primary osteoarthritis, left hand: Secondary | ICD-10-CM

## 2022-12-28 DIAGNOSIS — I10 Essential (primary) hypertension: Secondary | ICD-10-CM

## 2022-12-28 DIAGNOSIS — M19041 Primary osteoarthritis, right hand: Secondary | ICD-10-CM

## 2022-12-28 DIAGNOSIS — R768 Other specified abnormal immunological findings in serum: Secondary | ICD-10-CM

## 2022-12-30 LAB — COMPLETE METABOLIC PANEL WITH GFR
AG Ratio: 1.6 (calc) (ref 1.0–2.5)
ALT: 20 U/L (ref 6–29)
AST: 15 U/L (ref 10–35)
Albumin: 4.7 g/dL (ref 3.6–5.1)
Alkaline phosphatase (APISO): 54 U/L (ref 31–125)
BUN: 11 mg/dL (ref 7–25)
CO2: 25 mmol/L (ref 20–32)
Calcium: 10.6 mg/dL — ABNORMAL HIGH (ref 8.6–10.2)
Chloride: 106 mmol/L (ref 98–110)
Creat: 0.84 mg/dL (ref 0.50–0.99)
Globulin: 3 g/dL (calc) (ref 1.9–3.7)
Glucose, Bld: 87 mg/dL (ref 65–99)
Potassium: 4.8 mmol/L (ref 3.5–5.3)
Sodium: 138 mmol/L (ref 135–146)
Total Bilirubin: 0.6 mg/dL (ref 0.2–1.2)
Total Protein: 7.7 g/dL (ref 6.1–8.1)
eGFR: 86 mL/min/{1.73_m2} (ref >=60)

## 2022-12-30 LAB — CBC WITH DIFFERENTIAL/PLATELET
Absolute Monocytes: 334 cells/uL (ref 200–950)
Basophils Absolute: 38 cells/uL (ref 0–200)
Basophils Relative: 0.6 %
Eosinophils Absolute: 88 cells/uL (ref 15–500)
Eosinophils Relative: 1.4 %
HCT: 41.3 % (ref 35.0–45.0)
Hemoglobin: 13.9 g/dL (ref 11.7–15.5)
Lymphs Abs: 2218 cells/uL (ref 850–3900)
MCH: 28.4 pg (ref 27.0–33.0)
MCHC: 33.7 g/dL (ref 32.0–36.0)
MCV: 84.3 fL (ref 80.0–100.0)
MPV: 11.3 fL (ref 7.5–12.5)
Monocytes Relative: 5.3 %
Neutro Abs: 3623 cells/uL (ref 1500–7800)
Neutrophils Relative %: 57.5 %
Platelets: 322 10*3/uL (ref 140–400)
RBC: 4.9 10*6/uL (ref 3.80–5.10)
RDW: 14.3 % (ref 11.0–15.0)
Total Lymphocyte: 35.2 %
WBC: 6.3 10*3/uL (ref 3.8–10.8)

## 2022-12-30 LAB — ANTI-NUCLEAR AB-TITER (ANA TITER): ANA Titer 1: 1:80 {titer} — ABNORMAL HIGH

## 2022-12-30 LAB — PROTEIN / CREATININE RATIO, URINE
Creatinine, Urine: 160 mg/dL (ref 20–275)
Protein/Creat Ratio: 69 mg/g creat (ref 24–184)
Protein/Creatinine Ratio: 0.069 mg/mg creat (ref 0.024–0.184)
Total Protein, Urine: 11 mg/dL (ref 5–24)

## 2022-12-30 LAB — ANA: Anti Nuclear Antibody (ANA): POSITIVE — AB

## 2022-12-30 LAB — SEDIMENTATION RATE: Sed Rate: 6 mm/h (ref 0–20)

## 2022-12-30 LAB — ANTI-DNA ANTIBODY, DOUBLE-STRANDED: ds DNA Ab: <1 IU/mL

## 2022-12-30 LAB — C3 AND C4
C3 Complement: 171 mg/dL (ref 83–193)
C4 Complement: 61 mg/dL — ABNORMAL HIGH (ref 15–57)

## 2022-12-31 NOTE — Progress Notes (Signed)
Calcium is elevated.  Please advise patient to avoid calcium supplement.  Complements are normal.  ANA is positive and low titer.  Urine protein is negative.  CBC normal, sed rate normal, double-stranded DNA negative.  Labs do not indicate an active autoimmune disease.

## 2023-01-04 LAB — HM DIABETES EYE EXAM

## 2023-01-17 ENCOUNTER — Other Ambulatory Visit: Payer: Self-pay | Admitting: Internal Medicine

## 2023-01-17 DIAGNOSIS — E876 Hypokalemia: Secondary | ICD-10-CM

## 2023-01-17 DIAGNOSIS — I10 Essential (primary) hypertension: Secondary | ICD-10-CM

## 2023-01-31 ENCOUNTER — Other Ambulatory Visit: Payer: Self-pay | Admitting: Internal Medicine

## 2023-02-23 ENCOUNTER — Other Ambulatory Visit: Payer: Self-pay | Admitting: Internal Medicine

## 2023-02-23 DIAGNOSIS — K2100 Gastro-esophageal reflux disease with esophagitis, without bleeding: Secondary | ICD-10-CM

## 2023-03-09 DIAGNOSIS — L669 Cicatricial alopecia, unspecified: Secondary | ICD-10-CM | POA: Diagnosis not present

## 2023-03-17 ENCOUNTER — Encounter: Payer: Self-pay | Admitting: Internal Medicine

## 2023-03-17 ENCOUNTER — Other Ambulatory Visit: Payer: Self-pay | Admitting: Internal Medicine

## 2023-03-17 ENCOUNTER — Ambulatory Visit: Payer: BC Managed Care – PPO | Admitting: Internal Medicine

## 2023-03-17 VITALS — BP 116/70 | HR 97 | Temp 98.1°F | Ht 63.5 in | Wt 186.0 lb

## 2023-03-17 DIAGNOSIS — E785 Hyperlipidemia, unspecified: Secondary | ICD-10-CM

## 2023-03-17 DIAGNOSIS — Z23 Encounter for immunization: Secondary | ICD-10-CM | POA: Diagnosis not present

## 2023-03-17 DIAGNOSIS — E538 Deficiency of other specified B group vitamins: Secondary | ICD-10-CM

## 2023-03-17 DIAGNOSIS — Z0000 Encounter for general adult medical examination without abnormal findings: Secondary | ICD-10-CM | POA: Diagnosis not present

## 2023-03-17 DIAGNOSIS — Z1231 Encounter for screening mammogram for malignant neoplasm of breast: Secondary | ICD-10-CM

## 2023-03-17 DIAGNOSIS — R8281 Pyuria: Secondary | ICD-10-CM

## 2023-03-17 DIAGNOSIS — I10 Essential (primary) hypertension: Secondary | ICD-10-CM | POA: Diagnosis not present

## 2023-03-17 DIAGNOSIS — E118 Type 2 diabetes mellitus with unspecified complications: Secondary | ICD-10-CM

## 2023-03-17 LAB — BASIC METABOLIC PANEL
BUN: 6 mg/dL (ref 6–23)
CO2: 26 mEq/L (ref 19–32)
Calcium: 9.7 mg/dL (ref 8.4–10.5)
Chloride: 103 mEq/L (ref 96–112)
Creatinine, Ser: 0.87 mg/dL (ref 0.40–1.20)
GFR: 79.4 mL/min (ref >60.00)
Glucose, Bld: 95 mg/dL (ref 70–99)
Potassium: 3.5 mEq/L (ref 3.5–5.1)
Sodium: 135 mEq/L (ref 135–145)

## 2023-03-17 LAB — URINALYSIS, ROUTINE W REFLEX MICROSCOPIC
Bilirubin Urine: NEGATIVE
Hgb urine dipstick: NEGATIVE
Ketones, ur: NEGATIVE
Nitrite: NEGATIVE
RBC / HPF: NONE SEEN (ref 0–?)
Specific Gravity, Urine: 1.015 (ref 1.000–1.030)
Total Protein, Urine: NEGATIVE
Urine Glucose: NEGATIVE
Urobilinogen, UA: 0.2 (ref 0.0–1.0)
pH: 6 (ref 5.0–8.0)

## 2023-03-17 LAB — LIPID PANEL
Cholesterol: 119 mg/dL (ref 0–200)
HDL: 40.2 mg/dL (ref >39.00)
LDL Cholesterol: 59 mg/dL (ref 0–99)
NonHDL: 78.78
Total CHOL/HDL Ratio: 3
Triglycerides: 97 mg/dL (ref 0.0–149.0)
VLDL: 19.4 mg/dL (ref 0.0–40.0)

## 2023-03-17 LAB — MICROALBUMIN / CREATININE URINE RATIO
Creatinine,U: 211.2 mg/dL
Microalb Creat Ratio: 3 mg/g (ref 0.0–30.0)
Microalb, Ur: 6.4 mg/dL — ABNORMAL HIGH (ref 0.0–1.9)

## 2023-03-17 LAB — HEMOGLOBIN A1C: Hgb A1c MFr Bld: 5.5 % (ref 4.6–6.5)

## 2023-03-17 LAB — VITAMIN B12: Vitamin B-12: 1200 pg/mL — ABNORMAL HIGH (ref 211–911)

## 2023-03-17 LAB — TSH: TSH: 2.98 u[IU]/mL (ref 0.35–5.50)

## 2023-03-17 LAB — FOLATE: Folate: 8.6 ng/mL (ref >5.9)

## 2023-03-17 NOTE — Progress Notes (Signed)
Subjective:  Patient ID: Samantha Ball, female    DOB: July 04, 1976  Age: 47 y.o. MRN: [id number]  CC: Annual Exam, Hyperlipidemia, and Diabetes   HPI Samantha Ball presents for a CPX and f/up ----  She walks up to 2 miles a day.  Her endurance is good.  She denies chest pain, shortness of breath, diaphoresis, or edema.  Outpatient Medications Prior to Visit  Medication Sig Dispense Refill   Ascorbic Acid (VITAMIN C PO) Take by mouth daily.     BIOTIN 5000 PO Take 5,000 mcg by mouth daily.     cholecalciferol (VITAMIN D3) 25 MCG (1000 UNIT) tablet Take 1,000 Units by mouth daily.     Cranberry 400 MG CAPS Take 800 mg by mouth daily.     cyanocobalamin (VITAMIN B12) 1000 MCG tablet Take 1,000 mcg by mouth daily.     fluconazole (DIFLUCAN) 150 MG tablet Take 1 tablet (150 mg total) by mouth every 3 (three) days. 2 tablet 0   Iron-Vitamins (GERITOL PO) Take 15 mLs by mouth daily.     levocetirizine (XYZAL) 5 MG tablet TAKE 1 TABLET BY MOUTH EVERY DAY IN THE EVENING 90 tablet 1   methenamine (HIPREX) 1 g tablet Take 1 g by mouth 2 (two) times daily.     naproxen (NAPROSYN) 500 MG tablet TAKE 1 TABLET BY MOUTH 2 TIMES DAILY WITH A MEAL. 60 tablet 3   nebivolol (BYSTOLIC) 5 MG tablet TAKE 1 TABLET BY MOUTH EVERY DAY 90 tablet 0   omeprazole (PRILOSEC) 40 MG capsule TAKE 1 CAPSULE (40 MG TOTAL) BY MOUTH DAILY. 90 capsule 1   Rimegepant Sulfate (NURTEC) 75 MG TBDP Take 1 tablet by mouth every other day. (Patient taking differently: Take 1 tablet by mouth as needed.) 46 tablet 1   spironolactone (ALDACTONE) 25 MG tablet TAKE 1 TABLET (25 MG TOTAL) BY MOUTH DAILY. 90 tablet 0   Vitamin D, Ergocalciferol, (DRISDOL) 1.25 MG (50000 UNIT) CAPS capsule Take 1 capsule (50,000 Units total) by mouth every 7 (seven) days. 12 capsule 0   rosuvastatin (CRESTOR) 10 MG tablet TAKE 1 TABLET BY MOUTH EVERY DAY 90 tablet 1   sulfamethoxazole-trimethoprim (BACTRIM DS) 800-160 MG tablet Take 1 tablet by mouth 2  (two) times daily. 6 tablet 0   tirzepatide (MOUNJARO) 7.5 MG/0.5ML Pen Inject 7.5 mg into the skin once a week. 6 mL 0   No facility-administered medications prior to visit.    ROS Review of Systems  Constitutional: Negative.  Negative for chills, diaphoresis, fatigue and fever.  HENT: Negative.    Eyes: Negative.   Respiratory:  Negative for cough, chest tightness, wheezing and stridor.   Cardiovascular:  Negative for chest pain, palpitations and leg swelling.  Gastrointestinal:  Negative for abdominal pain, constipation, diarrhea, nausea and vomiting.  Endocrine: Negative.   Genitourinary: Negative.  Negative for difficulty urinating.  Musculoskeletal: Negative.  Negative for arthralgias and myalgias.  Skin: Negative.  Negative for pallor and rash.  Neurological: Negative.  Negative for dizziness and weakness.  Hematological:  Negative for adenopathy. Does not bruise/bleed easily.  Psychiatric/Behavioral: Negative.      Objective:  BP 116/70 (BP Location: Left Arm, Patient Position: Sitting, Cuff Size: Large)   Pulse 97   Temp 98.1 F (36.7 C) (Oral)   Ht 5' 3.5" (1.613 m)   Wt 186 lb (84.4 kg)   LMP 11/04/2021   SpO2 99%   BMI 32.43 kg/m   BP Readings from Last 3 Encounters:  03/17/23 116/70  12/28/22 112/85  09/09/22 108/78    Wt Readings from Last 3 Encounters:  03/17/23 186 lb (84.4 kg)  12/28/22 190 lb 12.8 oz (86.5 kg)  09/09/22 201 lb (91.2 kg)    Physical Exam Vitals reviewed.  Constitutional:      Appearance: Normal appearance.  HENT:     Nose: Nose normal.     Mouth/Throat:     Mouth: Mucous membranes are moist.  Eyes:     General: No scleral icterus.    Conjunctiva/sclera: Conjunctivae normal.  Cardiovascular:     Rate and Rhythm: Normal rate and regular rhythm.     Heart sounds: No murmur heard. Pulmonary:     Effort: Pulmonary effort is normal.     Breath sounds: No stridor. No wheezing, rhonchi or rales.  Abdominal:     General:  Abdomen is flat.     Palpations: There is no mass.     Tenderness: There is no abdominal tenderness. There is no guarding or rebound.     Hernia: No hernia is present.  Musculoskeletal:        General: Normal range of motion.     Cervical back: Neck supple.     Right lower leg: No edema.     Left lower leg: No edema.  Lymphadenopathy:     Cervical: No cervical adenopathy.  Skin:    General: Skin is warm and dry.  Neurological:     General: No focal deficit present.     Mental Status: She is alert. Mental status is at baseline.  Psychiatric:        Mood and Affect: Mood normal.        Behavior: Behavior normal.     Lab Results  Component Value Date   WBC 6.3 12/28/2022   HGB 13.9 12/28/2022   HCT 41.3 12/28/2022   PLT 322 12/28/2022   GLUCOSE 95 03/17/2023   CHOL 119 03/17/2023   TRIG 97.0 03/17/2023   HDL 40.20 03/17/2023   LDLDIRECT 99.0 12/21/2016   LDLCALC 59 03/17/2023   ALT 20 12/28/2022   AST 15 12/28/2022   NA 135 03/17/2023   K 3.5 03/17/2023   CL 103 03/17/2023   CREATININE 0.87 03/17/2023   BUN 6 03/17/2023   CO2 26 03/17/2023   TSH 2.98 03/17/2023   HGBA1C 5.5 03/17/2023   MICROALBUR 6.4 (H) 03/17/2023    MM DIAG BREAST TOMO UNI LEFT  Result Date: 03/19/2022 CLINICAL DATA:  47 year old female recalled from screening mammogram dated 03/12/2022 for a possible left breast asymmetry. EXAM: DIGITAL DIAGNOSTIC UNILATERAL LEFT MAMMOGRAM WITH TOMOSYNTHESIS AND CAD TECHNIQUE: Left digital diagnostic mammography and breast tomosynthesis was performed. The images were evaluated with computer-aided detection. COMPARISON:  Previous exam(s). ACR Breast Density Category b: There are scattered areas of fibroglandular density. FINDINGS: Previously described, possible asymmetry in the central left breast seen on the MLO projection resolves into well dispersed fibroglandular tissue on additional views. No suspicious findings identified. IMPRESSION: No mammographic evidence of  malignancy. RECOMMENDATION: Screening mammogram in one year.(Code:SM-B-01Y) I have discussed the findings and recommendations with the patient. If applicable, a reminder letter will be sent to the patient regarding the next appointment. BI-RADS CATEGORY  1: Negative. Electronically Signed   By: Kristopher Oppenheim M.D.   On: 03/19/2022 11:17   Assessment & Plan:  B12 deficiency- Her B12 is too high.  She will stop taking the B12 supplement. -     Vitamin B12; Future -  Folate; Future  Hyperlipidemia LDL goal <130- LDL goal achieved. Doing well on the statin  -     Lipid panel; Future -     TSH; Future -     Rosuvastatin Calcium; Take 1 tablet (10 mg total) by mouth daily.  Dispense: 90 tablet; Refill: 1  Type II diabetes mellitus with manifestations ([place])- Her blood sugar is adequately well-controlled. -     Basic metabolic panel; Future -     Hemoglobin A1c; Future -     Microalbumin / creatinine urine ratio; Future -     HM Diabetes Foot Exam -     Tirzepatide; Inject 7.5 mg into the skin once a week.  Dispense: 6 mL; Refill: 1  Essential hypertension, benign- Her blood pressure is adequately well-controlled. -     Basic metabolic panel; Future -     TSH; Future -     Urinalysis, Routine w reflex microscopic; Future  Routine general medical examination at a health care facility- Exam completed, labs reviewed, vaccines reviewed and updated, cancer screenings addressed, patient education was given.  Visit for screening mammogram -     Digital Screening Mammogram, Left and Right; Future  Pyuria -     CULTURE, URINE COMPREHENSIVE; Future -     GC/Chlamydia Probe Amp; Future  Other orders -     Tdap vaccine greater than or equal to 7yo IM     Follow-up: Return in about 6 months (around 09/17/2023).  Scarlette Calico, MD

## 2023-03-17 NOTE — Patient Instructions (Signed)

## 2023-03-18 ENCOUNTER — Other Ambulatory Visit: Payer: Self-pay | Admitting: Internal Medicine

## 2023-03-18 DIAGNOSIS — E118 Type 2 diabetes mellitus with unspecified complications: Secondary | ICD-10-CM

## 2023-03-19 MED ORDER — ROSUVASTATIN CALCIUM 10 MG PO TABS: 10.0000 mg | ORAL_TABLET | Freq: Every day | ORAL | 1 refills | Status: DC

## 2023-03-19 MED ORDER — TIRZEPATIDE 7.5 MG/0.5ML ~~LOC~~ SOAJ: 7.5000 mg | SUBCUTANEOUS | 1 refills | Status: DC

## 2023-03-20 ENCOUNTER — Other Ambulatory Visit: Payer: Self-pay | Admitting: Internal Medicine

## 2023-03-21 DIAGNOSIS — R8281 Pyuria: Secondary | ICD-10-CM | POA: Diagnosis not present

## 2023-03-23 ENCOUNTER — Other Ambulatory Visit: Payer: Self-pay | Admitting: Internal Medicine

## 2023-03-23 DIAGNOSIS — N3000 Acute cystitis without hematuria: Secondary | ICD-10-CM

## 2023-03-23 LAB — GC/CHLAMYDIA PROBE AMP
Chlamydia trachomatis, NAA: NEGATIVE
Neisseria Gonorrhoeae by PCR: NEGATIVE

## 2023-03-23 MED ORDER — NITROFURANTOIN MONOHYD MACRO 100 MG PO CAPS: 100.0000 mg | ORAL_CAPSULE | Freq: Two times a day (BID) | ORAL | 0 refills | Status: AC

## 2023-03-24 LAB — CULTURE, URINE COMPREHENSIVE

## 2023-03-29 ENCOUNTER — Ambulatory Visit
Admission: RE | Admit: 2023-03-29 | Discharge: 2023-03-29 | Disposition: A | Discharge status: Home / Self Care | Payer: BC Managed Care – PPO | Source: Ambulatory Visit | Attending: Internal Medicine | Admitting: Internal Medicine

## 2023-03-29 DIAGNOSIS — Z1231 Encounter for screening mammogram for malignant neoplasm of breast: Secondary | ICD-10-CM

## 2023-03-30 ENCOUNTER — Other Ambulatory Visit: Payer: Self-pay | Admitting: Internal Medicine

## 2023-03-30 DIAGNOSIS — I10 Essential (primary) hypertension: Secondary | ICD-10-CM

## 2023-05-21 ENCOUNTER — Other Ambulatory Visit (HOSPITAL_COMMUNITY): Payer: Self-pay

## 2023-05-24 ENCOUNTER — Telehealth: Payer: Self-pay

## 2023-05-24 ENCOUNTER — Other Ambulatory Visit (HOSPITAL_COMMUNITY): Payer: Self-pay

## 2023-05-24 NOTE — Telephone Encounter (Signed)
Received notification from [facility] that PA is required for [facility] 7.5MG /0.5ML. Couldn't submit through [facility], called [employer] and they will be faxing a PA form to [phone].  Waiting to receive

## 2023-05-30 ENCOUNTER — Ambulatory Visit: Payer: BC Managed Care – PPO | Admitting: Obstetrics and Gynecology

## 2023-05-30 ENCOUNTER — Encounter: Payer: Self-pay | Admitting: Obstetrics and Gynecology

## 2023-05-30 VITALS — BP 112/70 | Temp 98.9°F | Wt 180.0 lb

## 2023-05-30 DIAGNOSIS — N952 Postmenopausal atrophic vaginitis: Secondary | ICD-10-CM | POA: Diagnosis not present

## 2023-05-30 DIAGNOSIS — K644 Residual hemorrhoidal skin tags: Secondary | ICD-10-CM | POA: Diagnosis not present

## 2023-05-30 DIAGNOSIS — R109 Unspecified abdominal pain: Secondary | ICD-10-CM

## 2023-05-30 DIAGNOSIS — N3090 Cystitis, unspecified without hematuria: Secondary | ICD-10-CM | POA: Diagnosis not present

## 2023-05-30 DIAGNOSIS — R350 Frequency of micturition: Secondary | ICD-10-CM | POA: Diagnosis not present

## 2023-05-30 LAB — URINALYSIS, COMPLETE W/RFL CULTURE
Nitrites, Initial: POSITIVE — AB
RBC / HPF: NONE SEEN /HPF (ref 0–2)
pH: 5.5 (ref 5.0–8.0)

## 2023-05-30 MED ORDER — ESTRADIOL 0.1 MG/GM VA CREA
TOPICAL_CREAM | VAGINAL | 0 refills | Status: DC
Start: 2023-05-30 — End: 2023-09-29

## 2023-05-30 MED ORDER — PHENAZOPYRIDINE HCL 200 MG PO TABS
200.0000 mg | ORAL_TABLET | Freq: Three times a day (TID) | ORAL | 0 refills | Status: DC | PRN
Start: 2023-05-30 — End: 2023-09-29

## 2023-05-30 MED ORDER — SULFAMETHOXAZOLE-TRIMETHOPRIM 800-160 MG PO TABS
1.0000 | ORAL_TABLET | Freq: Two times a day (BID) | ORAL | 0 refills | Status: DC
Start: 2023-05-30 — End: 2023-09-29

## 2023-05-30 NOTE — Progress Notes (Signed)
Opened in error

## 2023-05-30 NOTE — Progress Notes (Signed)
GYNECOLOGY  VISIT   HPI: 47 y.o.   Married  [place]  female   [phone] with Patient's last menstrual period was 11/04/2021.   here for urinary frequency, flank pain and urine odor, onset one week ago.  Having back pain and some constipation.    No blood in her urine.   No nausea or vomiting.   No fever, shakes, chills.   Took an enema yesterday.  E Coli UTI 03/21/23. E Coli UTI 09/09/22. E Coli UTI 03/10/22. E Coli UTI 06/04/21.  Saw a urologist following her UTI in September.   Taking Methenamine and cranberry.   Not hot flashes. Has vaginal dryness with sex.  States bladder infections are not postcoital.   Just back from vacation.   GYNECOLOGIC HISTORY: Patient's last menstrual period was 11/04/2021. Contraception:  hysterectomy  Menopausal hormone therapy:  none  Last mammogram:  03/30/23 density B Bi-rads 1 neg  Last pap smear:   09/08/21 WNL Hr hpv Neg         OB History     Gravida  3   Para  2   Term      Preterm      AB  1   Living  2      SAB  1   IAB      Ectopic      Multiple      Live Births                 Patient Active Problem List   Diagnosis Date Noted   Cervical cancer screening 05/13/2021   Type II diabetes mellitus with manifestations (HCC) 05/13/2021   Onychomycosis 04/27/2021   Sensorineural hearing loss 04/27/2021   Migraine without aura and without status migrainosus, not intractable 11/17/2020   Routine general medical examination at a health care facility 04/01/2020   Intrinsic eczema 04/01/2020   Arthralgia 02/01/2019   Vitamin D deficiency disease 11/21/2018   B12 deficiency 11/21/2018   Perennial allergic rhinitis 03/06/2018   Seasonal allergic rhinitis due to pollen 05/09/2017   Hyperlipidemia LDL goal <130 12/21/2016   Snoring 11/09/2016   Other iron deficiency anemias 12/17/2014   GERD (gastroesophageal reflux disease) 01/09/2014   Constipation 01/09/2014   Positive ANA (antinuclear antibody)  07/16/2013   Essential hypertension, benign 07/11/2013   Visit for screening mammogram 07/11/2013   Obesity (BMI 30.0-34.9) 07/11/2013    Past Medical History:  Diagnosis Date   Anemia    Arthritis    oa   DM Type 2    diet controlled   Fibroids    Dr. Normand Sloop, per patient.    GERD (gastroesophageal reflux disease)    Hyperlipidemia    Hypertension    Internal hemorrhoid    Intrinsic eczema 04/01/2020   migraine    Obesity    Sleep apnea    not currently using CPAP did not tolerate cpap   Urticaria    Vitamin B deficiency    Wears glasses 11/17/2021   for reading   Wears hearing aid in both ears 11/17/2021    Past Surgical History:  Procedure Laterality Date   colonscopy     x 2 last done 2021 or 2019   CYSTOSCOPY N/A 11/24/2021   Procedure: CYSTOSCOPY;  Surgeon: Patton Salles, MD;  Location: [facility];  Service: Gynecology;  Laterality: N/A;   DILATION AND CURETTAGE OF UTERUS N/A 11/19/2000   TOTAL LAPAROSCOPIC HYSTERECTOMY WITH SALPINGECTOMY Bilateral 11/24/2021  Procedure: TOTAL LAPAROSCOPIC HYSTERECTOMY WITHBILATERAL SALPINGECTOMY,;  Surgeon: Patton Salles, MD;  Location: [facility];  Service: Gynecology;  Laterality: Bilateral;   TUBAL LIGATION  2004   wisdom teeth  12/20/1998    Current Outpatient Medications  Medication Sig Dispense Refill   BIOTIN 5000 PO Take 5,000 mcg by mouth daily.     cholecalciferol (VITAMIN D3) 25 MCG (1000 UNIT) tablet Take 1,000 Units by mouth daily.     Cranberry 400 MG CAPS Take 800 mg by mouth daily.     estradiol (ESTRACE) 0.1 MG/GM vaginal cream Use 1/2 g vaginally every night for the first 2 weeks, then use 1/2 g vaginally two or three times per week as needed to maintain symptom relief. 42.5 g 0   levocetirizine (XYZAL) 5 MG tablet TAKE 1 TABLET BY MOUTH EVERY DAY IN THE EVENING 90 tablet 1   methenamine (HIPREX) 1 g tablet Take 1 g by mouth 2 (two) times daily.      naproxen (NAPROSYN) 500 MG tablet TAKE 1 TABLET BY MOUTH TWICE A DAY WITH FOOD 60 tablet 3   nebivolol (BYSTOLIC) 5 MG tablet TAKE 1 TABLET BY MOUTH EVERY DAY 90 tablet 0   omeprazole (PRILOSEC) 40 MG capsule TAKE 1 CAPSULE (40 MG TOTAL) BY MOUTH DAILY. 90 capsule 1   phenazopyridine (PYRIDIUM) 200 MG tablet Take 1 tablet (200 mg total) by mouth 3 (three) times daily as needed for pain. 10 tablet 0   rosuvastatin (CRESTOR) 10 MG tablet Take 1 tablet (10 mg total) by mouth daily. 90 tablet 1   sulfamethoxazole-trimethoprim (BACTRIM DS) 800-160 MG tablet Take 1 tablet by mouth 2 (two) times daily. One PO BID x 3 days 6 tablet 0   tirzepatide (MOUNJARO) 7.5 MG/0.5ML Pen Inject 7.5 mg into the skin once a week. 6 mL 1   Vitamin D, Ergocalciferol, (DRISDOL) 1.25 MG (50000 UNIT) CAPS capsule Take 1 capsule (50,000 Units total) by mouth every 7 (seven) days. 12 capsule 0   Rimegepant Sulfate (NURTEC) 75 MG TBDP Take 1 tablet by mouth every other day. (Patient not taking: Reported on 05/30/2023) 46 tablet 1   No current facility-administered medications for this visit.     ALLERGIES: Amlodipine  Family History  Problem Relation Age of Onset   Heart disease Father    Allergic rhinitis Daughter    Eczema Daughter    Breast cancer Maternal Aunt    Diabetes Maternal Aunt    Breast cancer Maternal Grandmother    Breast cancer Cousin    Alcohol abuse Neg Hx    COPD Neg Hx    Depression Neg Hx    Drug abuse Neg Hx    Early death Neg Hx    Hearing loss Neg Hx    Hyperlipidemia Neg Hx    Hypertension Neg Hx    Kidney disease Neg Hx    Stroke Neg Hx    Colon cancer Neg Hx    Rectal cancer Neg Hx    Stomach cancer Neg Hx     Social History   Socioeconomic History   Marital status: Married    Spouse name: Not on file   Number of children: Not on file   Years of education: Not on file   Highest education level: Not on file  Occupational History   Not on file  Tobacco Use   Smoking status:  Never    Passive exposure: Never   Smokeless tobacco: Never  Vaping Use  Vaping Use: Never used  Substance and Sexual Activity   Alcohol use: No   Drug use: No   Sexual activity: Yes    Birth control/protection: Surgical    Comment: BTL  Other Topics Concern   Not on file  Social History Narrative   Not on file   Social Determinants of Health   Financial Resource Strain: Not on file  Food Insecurity: Not on file  Transportation Needs: Not on file  Physical Activity: Not on file  Stress: Not on file  Social Connections: Not on file  Intimate Partner Violence: Not on file    Review of Systems  Genitourinary:  Positive for flank pain and frequency.  All other systems reviewed and are negative.   PHYSICAL EXAMINATION:    BP 112/70   Temp 98.9 F (37.2 C)   Wt 180 lb (81.6 kg)   LMP 11/04/2021   SpO2 100%   BMI 31.39 kg/m     General appearance: alert, cooperative and appears stated age   Pelvic: External genitalia:  no lesions              Urethra:  normal appearing urethra with no masses, tenderness or lesions              Bartholins and Skenes: normal                 Vagina: normal appearing vagina with normal color and discharge, no lesions              Cervix: no lesions                Bimanual Exam:  Uterus:  normal size, contour, position, consistency, mobility, non-tender              Adnexa: no mass, fullness, tenderness                   Anus:  large external hemorrhoids.  Chaperone was present for exam:  Warren Lacy, CMA  ASSESSMENT  Hx UTIs. On Methenamine.  Cystitis.  Flank pain.  Vaginal atrophy.   Hemorrhoids.  External.   PLAN  Urinalysis:  sg 1.015, pH 5.5, 10 - 20 WBC, NS RBC, 6 - 10 Epis, many bacteria, few mucus.  UC sent.  Stop Methenamine while treating UTI.  Bactrim DS po bid x 3 days.  Pyridium 200 mg po tid x 2 days prn.  Call if no improvement in 48 hours. Vaginal Estrace cream.  Instructed in use.  Referral to Dr. Romie Levee.  Fu here prn.   30 min  total time was spent for this patient encounter, including preparation, face-to-face counseling with the patient, coordination of care, and documentation of the encounter.

## 2023-05-30 NOTE — Patient Instructions (Signed)
Urinary Tract Infection, Adult  A urinary tract infection (UTI) is an infection of any part of the urinary tract. The urinary tract includes the kidneys, ureters, bladder, and urethra. These organs make, store, and get rid of urine in the body. An upper UTI affects the ureters and kidneys. A lower UTI affects the bladder and urethra. What are the causes? Most urinary tract infections are caused by bacteria in your genital area around your urethra, where urine leaves your body. These bacteria grow and cause inflammation of your urinary tract. What increases the risk? You are more likely to develop this condition if: You have a urinary catheter that stays in place. You are not able to control when you urinate or have a bowel movement (incontinence). You are female and you: Use a spermicide or diaphragm for birth control. Have low estrogen levels. Are pregnant. You have certain genes that increase your risk. You are sexually active. You take antibiotic medicines. You have a condition that causes your flow of urine to slow down, such as: An enlarged prostate, if you are female. Blockage in your urethra. A kidney stone. A nerve condition that affects your bladder control (neurogenic bladder). Not getting enough to drink, or not urinating often. You have certain medical conditions, such as: Diabetes. A weak disease-fighting system (immunesystem). Sickle cell disease. Gout. Spinal cord injury. What are the signs or symptoms? Symptoms of this condition include: Needing to urinate right away (urgency). Frequent urination. This may include small amounts of urine each time you urinate. Pain or burning with urination. Blood in the urine. Urine that smells bad or unusual. Trouble urinating. Cloudy urine. Vaginal discharge, if you are female. Pain in the abdomen or the lower back. You may also have: Vomiting or a decreased appetite. Confusion. Irritability or tiredness. A fever or  chills. Diarrhea. The first symptom in older adults may be confusion. In some cases, they may not have any symptoms until the infection has worsened. How is this diagnosed? This condition is diagnosed based on your medical history and a physical exam. You may also have other tests, including: Urine tests. Blood tests. Tests for STIs (sexually transmitted infections). If you have had more than one UTI, a cystoscopy or imaging studies may be done to determine the cause of the infections. How is this treated? Treatment for this condition includes: Antibiotic medicine. Over-the-counter medicines to treat discomfort. Drinking enough water to stay hydrated. If you have frequent infections or have other conditions such as a kidney stone, you may need to see a health care provider who specializes in the urinary tract (urologist). In rare cases, urinary tract infections can cause sepsis. Sepsis is a life-threatening condition that occurs when the body responds to an infection. Sepsis is treated in the hospital with IV antibiotics, fluids, and other medicines. Follow these instructions at home:  Medicines Take over-the-counter and prescription medicines only as told by your health care provider. If you were prescribed an antibiotic medicine, take it as told by your health care provider. Do not stop using the antibiotic even if you start to feel better. General instructions Make sure you: Empty your bladder often and completely. Do not hold urine for long periods of time. Empty your bladder after sex. Wipe from front to back after urinating or having a bowel movement if you are female. Use each tissue only one time when you wipe. Drink enough fluid to keep your urine pale yellow. Keep all follow-up visits. This is important. Contact a health   care provider if: Your symptoms do not get better after 1-2 days. Your symptoms go away and then return. Get help right away if: You have severe pain in  your back or your lower abdomen. You have a fever or chills. You have nausea or vomiting. Summary A urinary tract infection (UTI) is an infection of any part of the urinary tract, which includes the kidneys, ureters, bladder, and urethra. Most urinary tract infections are caused by bacteria in your genital area. Treatment for this condition often includes antibiotic medicines. If you were prescribed an antibiotic medicine, take it as told by your health care provider. Do not stop using the antibiotic even if you start to feel better. Keep all follow-up visits. This is important. This information is not intended to replace advice given to you by your health care provider. Make sure you discuss any questions you have with your health care provider. Document Revised: 07/13/2020 Document Reviewed: 07/18/2020 Elsevier Patient Education  2024 Elsevier Inc.  

## 2023-06-02 LAB — CULTURE INDICATED

## 2023-06-02 LAB — URINALYSIS, COMPLETE W/RFL CULTURE
Bilirubin Urine: NEGATIVE
Glucose, UA: NEGATIVE
Hgb urine dipstick: NEGATIVE
Hyaline Cast: NONE SEEN /LPF
Ketones, ur: NEGATIVE
Protein, ur: NEGATIVE
Specific Gravity, Urine: 1.015 (ref 1.001–1.035)

## 2023-06-02 LAB — URINE CULTURE
MICRO NUMBER:: 15061920
SPECIMEN QUALITY:: ADEQUATE

## 2023-06-14 NOTE — Progress Notes (Unsigned)
Office Visit Note  Patient: Samantha Ball             Date of Birth: 1976/05/20           MRN: [id number]             PCP: Etta Grandchild, MD Referring: Etta Grandchild, MD Visit Date: 06/28/2023 Occupation: @GUAROCC @  Subjective:  Sicca   History of Present Illness: Samantha Ball is a 47 y.o. female with history of positive ANA and osteoarthritis.  Patient denies any increased joint pain or joint swelling since her last office visit.  She has occasional stiffness in her hands but denies any joint swelling.  She denies any difficulty performing ADLs.  She has not had any nocturnal pain.  Patient states that she currently has 1 mouth sore but denies any nasal ulcers.  She continues to have ongoing dry mouth and dry eyes.  She denies any other new or worsening symptoms since her last office visit.  She has not had any swollen lymph nodes.  She denies any recent rashes.  She denies any symptoms of Raynaud's phenomenon.    Activities of Daily Living:  Patient reports morning stiffness for a few minutes.   Patient Denies nocturnal pain.  Difficulty dressing/grooming: Denies Difficulty climbing stairs: Denies Difficulty getting out of chair: Denies Difficulty using hands for taps, buttons, cutlery, and/or writing: Reports  Review of Systems  Constitutional:  Positive for fatigue.  HENT:  Positive for mouth sores. Negative for mouth dryness.   Eyes:  Positive for dryness.  Respiratory:  Negative for shortness of breath.   Cardiovascular:  Negative for chest pain and palpitations.  Gastrointestinal:  Positive for constipation. Negative for diarrhea.  Endocrine: Positive for increased urination.  Genitourinary:  Negative for involuntary urination.  Musculoskeletal:  Positive for joint pain, joint pain and morning stiffness. Negative for gait problem, joint swelling, myalgias, muscle weakness, muscle tenderness and myalgias.  Skin:  Positive for hair loss. Negative for color change,  rash and sensitivity to sunlight.  Allergic/Immunologic: Negative for susceptible to infections.  Neurological:  Positive for headaches. Negative for dizziness.  Hematological:  Negative for swollen glands.  Psychiatric/Behavioral:  Positive for sleep disturbance. Negative for depressed mood. The patient is not nervous/anxious.     PMFS History:  Patient Active Problem List   Diagnosis Date Noted   Cervical cancer screening 05/13/2021   Type II diabetes mellitus with manifestations (HCC) 05/13/2021   Onychomycosis 04/27/2021   Sensorineural hearing loss 04/27/2021   Migraine without aura and without status migrainosus, not intractable 11/17/2020   Routine general medical examination at a health care facility 04/01/2020   Intrinsic eczema 04/01/2020   Arthralgia 02/01/2019   Vitamin D deficiency disease 11/21/2018   B12 deficiency 11/21/2018   Perennial allergic rhinitis 03/06/2018   Seasonal allergic rhinitis due to pollen 05/09/2017   Hyperlipidemia LDL goal <130 12/21/2016   Snoring 11/09/2016   Other iron deficiency anemias 12/17/2014   GERD (gastroesophageal reflux disease) 01/09/2014   Constipation 01/09/2014   Positive ANA (antinuclear antibody) 07/16/2013   Essential hypertension, benign 07/11/2013   Visit for screening mammogram 07/11/2013   Obesity (BMI 30.0-34.9) 07/11/2013    Past Medical History:  Diagnosis Date   Anemia    Arthritis    oa   DM Type 2    diet controlled   Fibroids    Dr. Normand Sloop, per patient.    GERD (gastroesophageal reflux disease)    Hyperlipidemia  Hypertension    Internal hemorrhoid    Intrinsic eczema 04/01/2020   migraine    Obesity    Sleep apnea    not currently using CPAP did not tolerate cpap   Urticaria    Vitamin B deficiency    Wears glasses 11/17/2021   for reading   Wears hearing aid in both ears 11/17/2021    Family History  Problem Relation Age of Onset   Heart disease Father    Allergic rhinitis Daughter     Eczema Daughter    Breast cancer Maternal Aunt    Diabetes Maternal Aunt    Breast cancer Maternal Grandmother    Breast cancer Cousin    Alcohol abuse Neg Hx    COPD Neg Hx    Depression Neg Hx    Drug abuse Neg Hx    Early death Neg Hx    Hearing loss Neg Hx    Hyperlipidemia Neg Hx    Hypertension Neg Hx    Kidney disease Neg Hx    Stroke Neg Hx    Colon cancer Neg Hx    Rectal cancer Neg Hx    Stomach cancer Neg Hx    Past Surgical History:  Procedure Laterality Date   colonscopy     x 2 last done 2021 or 2019   CYSTOSCOPY N/A 11/24/2021   Procedure: CYSTOSCOPY;  Surgeon: Patton Salles, MD;  Location: [facility];  Service: Gynecology;  Laterality: N/A;   DILATION AND CURETTAGE OF UTERUS N/A 11/19/2000   TOTAL LAPAROSCOPIC HYSTERECTOMY WITH SALPINGECTOMY Bilateral 11/24/2021   Procedure: TOTAL LAPAROSCOPIC HYSTERECTOMY WITHBILATERAL SALPINGECTOMY,;  Surgeon: Patton Salles, MD;  Location: [facility];  Service: Gynecology;  Laterality: Bilateral;   TUBAL LIGATION  2004   wisdom teeth  12/20/1998   Social History   Social History Narrative   Not on file   Immunization History  Administered Date(s) Administered   Influenza Inj Mdck Quad With Preservative 09/01/2022   Influenza,inj,Quad PF,6+ Mos 02/04/2016, 11/09/2016, 01/18/2018, 11/20/2018, 09/12/2019, 11/17/2020, 09/17/2021   Influenza-Unspecified 01/09/2014   PFIZER(Purple Top)SARS-COV-2 Vaccination 03/20/2020, 04/17/2020, 01/29/2021   PNEUMOCOCCAL CONJUGATE-20 09/17/2021   Tdap 04/25/2013, 03/17/2023     Objective: Vital Signs: BP 103/72 (BP Location: Left Arm, Patient Position: Sitting, Cuff Size: Normal)   Pulse 76   Resp 14   Ht 5\' 3"  (1.6 m)   Wt 179 lb 3.2 oz (81.3 kg)   LMP 11/04/2021   BMI 31.74 kg/m    Physical Exam Vitals and nursing note reviewed.  Constitutional:      Appearance: She is well-developed.  HENT:     Head: Normocephalic and  atraumatic.  Eyes:     Conjunctiva/sclera: Conjunctivae normal.  Cardiovascular:     Rate and Rhythm: Normal rate and regular rhythm.     Heart sounds: Normal heart sounds.  Pulmonary:     Effort: Pulmonary effort is normal.     Breath sounds: Normal breath sounds.  Abdominal:     General: Bowel sounds are normal.     Palpations: Abdomen is soft.  Musculoskeletal:     Cervical back: Normal range of motion.  Lymphadenopathy:     Cervical: No cervical adenopathy.  Skin:    General: Skin is warm and dry.     Capillary Refill: Capillary refill takes less than 2 seconds.  Neurological:     Mental Status: She is alert and oriented to person, place, and time.  Psychiatric:  Behavior: Behavior normal.      Musculoskeletal Exam: C-spine, thoracic spine, lumbar spine have good range of motion.  Shoulder joints, elbow joints, wrist joints, MCPs, PIPs, DIPs have good range of motion with no synovitis.  Complete fist formation bilaterally.  Hip joints have good range of motion with no groin pain.  Knee joints have good range of motion with no warmth or effusion.  Ankle joints have good range of motion with no tenderness or joint swelling.  No tenderness or synovitis over MTP joints.  CDAI Exam: CDAI Score: -- Patient Global: --; Provider Global: -- Swollen: --; Tender: -- Joint Exam 06/28/2023   No joint exam has been documented for this visit   There is currently no information documented on the homunculus. Go to the Rheumatology activity and complete the homunculus joint exam.  Investigation: No additional findings.  Imaging: No results found.  Recent Labs: Lab Results  Component Value Date   WBC 6.3 12/28/2022   HGB 13.9 12/28/2022   PLT 322 12/28/2022   NA 135 03/17/2023   K 3.5 03/17/2023   CL 103 03/17/2023   CO2 26 03/17/2023   GLUCOSE 95 03/17/2023   BUN 6 03/17/2023   CREATININE 0.87 03/17/2023   BILITOT 0.6 12/28/2022   ALKPHOS 50 01/11/2020   AST 15  12/28/2022   ALT 20 12/28/2022   PROT 7.7 12/28/2022   ALBUMIN 4.2 01/11/2020   CALCIUM 9.7 03/17/2023   GFRAA 103 06/26/2021    Speciality Comments: No specialty comments available.  Procedures:  No procedures performed Allergies: Amlodipine    Assessment / Plan:     Visit Diagnoses: Positive ANA (antinuclear antibody) - AVISE: ANA 1: 320 speckled, ENA negative, CB CAP negative, antiphospholipid negative, beta-2 GP 1-, thyroid antibodies negative: She continues to experience intermittent stiffness in both hands but has no synovitis on examination today.  She has not been experiencing any nocturnal pain or difficulty with ADLs.  She has noticed increased sicca symptoms recently and has 1 oral ulcer currently.  Discussed the use of oral gel if the sore is painful.  No nasal ulcers.  No cervical lymphadenopathy.  No symptoms of Raynaud's phenomenon.  No Malar rash noted.  She has not noticed any photosensitivity.  Her lungs were clear to auscultation today.  She has not had any shortness of breath or pleuritic chest pain. Lab work 12/28/22: ANA 1:80 nuclear fine speckled, dsDNA negative, ESR WNL, complements-C3 171, C4 61, protein creatinine ratio WNL. The following lab work was obtained today for further evaluation. Advised to also be checked due to increased sicca symptoms.  She was vies notify us if she develops any new or worsening symptoms.  She does not require immunosuppressive therapy at this time.  She will follow-up in the office in 6 months or sooner if needed.  Plan: Protein / creatinine ratio, urine, CBC with Differential/Platelet, COMPLETE METABOLIC PANEL WITH GFR, Anti-DNA antibody, double-stranded, C3 and C4, Sedimentation rate, ANA, Sjogrens syndrome-A extractable nuclear antibody, Sjogrens syndrome-B extractable nuclear antibody  Sicca syndrome (HCC) - Patient presents today with increased mouth dryness and eye dryness.  No cervical lymphadenopathy or parotid swelling.  Plan to  update the following lab work including Ro antibody and La antibody for further evaluation.  Plan: Sjogrens syndrome-A extractable nuclear antibody, Sjogrens syndrome-B extractable nuclear antibody  Primary osteoarthritis of both hands: She experiences intermittent stiffness in both hands but has no joint tenderness or synovitis on examination today.  She is able to make a  complete fist bilaterally.  Primary osteoarthritis of both knees: She has good range of motion of both knee joints on examination today.  No warmth or effusion noted.  Other medical conditions are listed as follows:  Vitamin D deficiency  B12 deficiency  Essential hypertension, benign: Blood pressure was 103/72 today in the office.  Hyperlipidemia LDL goal <130  History of diabetes mellitus  Other insomnia    Orders: Orders Placed This Encounter  Procedures   Protein / creatinine ratio, urine   CBC with Differential/Platelet   COMPLETE METABOLIC PANEL WITH GFR   Anti-DNA antibody, double-stranded   C3 and C4   Sedimentation rate   ANA   Sjogrens syndrome-A extractable nuclear antibody   Sjogrens syndrome-B extractable nuclear antibody   No orders of the defined types were placed in this encounter.   Follow-Up Instructions: Return in about 6 months (around 12/29/2023) for +ANA, Osteoarthritis.   Gearldine Bienenstock, PA-C  Note - This record has been created using Dragon software.  Chart creation errors have been sought, but may not always  have been located. Such creation errors do not reflect on  the standard of medical care.

## 2023-06-17 ENCOUNTER — Other Ambulatory Visit: Payer: Self-pay | Admitting: Internal Medicine

## 2023-06-17 DIAGNOSIS — J3089 Other allergic rhinitis: Secondary | ICD-10-CM

## 2023-06-20 ENCOUNTER — Other Ambulatory Visit (HOSPITAL_COMMUNITY): Payer: Self-pay

## 2023-06-28 ENCOUNTER — Encounter: Payer: Self-pay | Admitting: Physician Assistant

## 2023-06-28 ENCOUNTER — Ambulatory Visit: Payer: BC Managed Care – PPO | Attending: Physician Assistant | Admitting: Physician Assistant

## 2023-06-28 VITALS — BP 103/72 | HR 76 | Resp 14 | Ht 63.0 in | Wt 179.2 lb

## 2023-06-28 DIAGNOSIS — I10 Essential (primary) hypertension: Secondary | ICD-10-CM

## 2023-06-28 DIAGNOSIS — M170 Bilateral primary osteoarthritis of knee: Secondary | ICD-10-CM

## 2023-06-28 DIAGNOSIS — G4709 Other insomnia: Secondary | ICD-10-CM

## 2023-06-28 DIAGNOSIS — E785 Hyperlipidemia, unspecified: Secondary | ICD-10-CM

## 2023-06-28 DIAGNOSIS — M3500 Sicca syndrome, unspecified: Secondary | ICD-10-CM | POA: Diagnosis not present

## 2023-06-28 DIAGNOSIS — M19042 Primary osteoarthritis, left hand: Secondary | ICD-10-CM

## 2023-06-28 DIAGNOSIS — M19041 Primary osteoarthritis, right hand: Secondary | ICD-10-CM | POA: Diagnosis not present

## 2023-06-28 DIAGNOSIS — R768 Other specified abnormal immunological findings in serum: Secondary | ICD-10-CM

## 2023-06-28 DIAGNOSIS — E559 Vitamin D deficiency, unspecified: Secondary | ICD-10-CM

## 2023-06-28 DIAGNOSIS — Z8639 Personal history of other endocrine, nutritional and metabolic disease: Secondary | ICD-10-CM

## 2023-06-28 DIAGNOSIS — E538 Deficiency of other specified B group vitamins: Secondary | ICD-10-CM

## 2023-06-28 LAB — CBC WITH DIFFERENTIAL/PLATELET
Basophils Absolute: 41 cells/uL (ref 0–200)
Basophils Relative: 0.6 %
Hemoglobin: 12.5 g/dL (ref 11.7–15.5)
MCHC: 33.1 g/dL (ref 32.0–36.0)
Neutrophils Relative %: 56.2 %
RDW: 14.1 % (ref 11.0–15.0)

## 2023-06-29 ENCOUNTER — Other Ambulatory Visit: Payer: Self-pay | Admitting: Internal Medicine

## 2023-06-29 DIAGNOSIS — I10 Essential (primary) hypertension: Secondary | ICD-10-CM

## 2023-06-29 LAB — COMPLETE METABOLIC PANEL WITH GFR
ALT: 11 U/L (ref 6–29)
Alkaline phosphatase (APISO): 48 U/L (ref 31–125)
BUN: 8 mg/dL (ref 7–25)
Calcium: 10.3 mg/dL — ABNORMAL HIGH (ref 8.6–10.2)
Glucose, Bld: 82 mg/dL (ref 65–99)
Sodium: 136 mmol/L (ref 135–146)
Total Protein: 7.3 g/dL (ref 6.1–8.1)

## 2023-06-29 LAB — CBC WITH DIFFERENTIAL/PLATELET
Absolute Monocytes: 421 cells/uL (ref 200–950)
Eosinophils Absolute: 110 cells/uL (ref 15–500)
MCH: 27.9 pg (ref 27.0–33.0)
MCV: 84.4 fL (ref 80.0–100.0)
MPV: 10.6 fL (ref 7.5–12.5)
Neutro Abs: 3878 cells/uL (ref 1500–7800)
Total Lymphocyte: 35.5 %
WBC: 6.9 10*3/uL (ref 3.8–10.8)

## 2023-06-29 LAB — SJOGRENS SYNDROME-A EXTRACTABLE NUCLEAR ANTIBODY: SSA (Ro) (ENA) Antibody, IgG: <1 AI

## 2023-06-29 LAB — PROTEIN / CREATININE RATIO, URINE
Creatinine, Urine: 131 mg/dL (ref 20–275)
Total Protein, Urine: 5 mg/dL (ref 5–24)

## 2023-06-30 LAB — COMPLETE METABOLIC PANEL WITH GFR
AG Ratio: 1.7 (calc) (ref 1.0–2.5)
AST: 11 U/L (ref 10–35)
Albumin: 4.6 g/dL (ref 3.6–5.1)
CO2: 25 mmol/L (ref 20–32)
Chloride: 105 mmol/L (ref 98–110)
Creat: 0.77 mg/dL (ref 0.50–0.99)
Globulin: 2.7 g/dL (calc) (ref 1.9–3.7)
Potassium: 4.2 mmol/L (ref 3.5–5.3)
Total Bilirubin: 0.5 mg/dL (ref 0.2–1.2)
eGFR: 96 mL/min/{1.73_m2} (ref >=60)

## 2023-06-30 LAB — PROTEIN / CREATININE RATIO, URINE
Protein/Creat Ratio: 38 mg/g creat (ref 24–184)
Protein/Creatinine Ratio: 0.038 mg/mg creat (ref 0.024–0.184)

## 2023-06-30 LAB — C3 AND C4
C3 Complement: 149 mg/dL (ref 83–193)
C4 Complement: 45 mg/dL (ref 15–57)

## 2023-06-30 LAB — SEDIMENTATION RATE: Sed Rate: 2 mm/h (ref 0–20)

## 2023-06-30 LAB — CBC WITH DIFFERENTIAL/PLATELET
Eosinophils Relative: 1.6 %
HCT: 37.8 % (ref 35.0–45.0)
Lymphs Abs: 2450 cells/uL (ref 850–3900)
Monocytes Relative: 6.1 %
Platelets: 268 10*3/uL (ref 140–400)
RBC: 4.48 10*6/uL (ref 3.80–5.10)

## 2023-06-30 LAB — ANTI-NUCLEAR AB-TITER (ANA TITER): ANA Titer 1: 1:80 {titer} — ABNORMAL HIGH

## 2023-06-30 LAB — ANA: Anti Nuclear Antibody (ANA): POSITIVE — AB

## 2023-06-30 LAB — SJOGRENS SYNDROME-B EXTRACTABLE NUCLEAR ANTIBODY: SSB (La) (ENA) Antibody, IgG: <1 AI

## 2023-06-30 LAB — ANTI-DNA ANTIBODY, DOUBLE-STRANDED: ds DNA Ab: <1 IU/mL

## 2023-06-30 NOTE — Progress Notes (Signed)
ANA remains positive--low titer-nonspecific.   Calcium is borderline elevated-10.3.  avoid taking a calcium supplement at this time. Rest of CMP WNL.  CBC WNL  ESR WNL Protein creatinine ratio WNL  Ro and La antibodies negative  dsDNA negative Complements WNL.

## 2023-08-21 ENCOUNTER — Other Ambulatory Visit: Payer: Self-pay | Admitting: Internal Medicine

## 2023-08-24 ENCOUNTER — Other Ambulatory Visit: Payer: Self-pay | Admitting: Internal Medicine

## 2023-08-24 DIAGNOSIS — K2100 Gastro-esophageal reflux disease with esophagitis, without bleeding: Secondary | ICD-10-CM

## 2023-09-12 ENCOUNTER — Ambulatory Visit: Payer: BC Managed Care – PPO | Admitting: Nurse Practitioner

## 2023-09-13 ENCOUNTER — Other Ambulatory Visit: Payer: Self-pay | Admitting: Internal Medicine

## 2023-09-13 DIAGNOSIS — E118 Type 2 diabetes mellitus with unspecified complications: Secondary | ICD-10-CM

## 2023-09-29 ENCOUNTER — Ambulatory Visit: Payer: BC Managed Care – PPO | Admitting: Nurse Practitioner

## 2023-09-29 ENCOUNTER — Encounter: Payer: Self-pay | Admitting: Nurse Practitioner

## 2023-09-29 VITALS — BP 132/80 | Ht 65.0 in | Wt 182.0 lb

## 2023-09-29 DIAGNOSIS — Z01419 Encounter for gynecological examination (general) (routine) without abnormal findings: Secondary | ICD-10-CM | POA: Diagnosis not present

## 2023-09-29 DIAGNOSIS — K644 Residual hemorrhoidal skin tags: Secondary | ICD-10-CM | POA: Diagnosis not present

## 2023-09-29 NOTE — Progress Notes (Signed)
   Samantha Ball 1976/03/15 [id number]   History:  47 y.o. G3P2 presents for annual exam. S/P 11/2021 TLH with bilateral salpingectomies for menorrhagia/fibroids. Normal pap history. T2DM, HTN, HLD managed by PCP. General surgery referral sent by Dr. Edward Jolly in June for hemorrhoids but patient has not heard from anyone.   Gynecologic History Patient's last menstrual period was 11/04/2021.   Contraception/Family planning: tubal ligation Sexually active: Yes  Health Maintenance Last Pap: 09/08/2021. Results were: Normal neg HPV Last mammogram: 03/29/2023. Results were: Normal Last colonoscopy: 04/09/2018. Results were: Normal, 10-year recall Last Dexa: Not indicated  Past medical history, past surgical history, family history and social history were all reviewed and documented in the EPIC chart. Married. 19 yo daughter. 75 yo son, living at home, working.   ROS:  A ROS was performed and pertinent positives and negatives are included.  Exam:  Vitals:   09/29/23 1437  BP: 132/80  Weight: 182 lb (82.6 kg)  Height: 5\' 5"  (1.651 m)     Body mass index is 30.29 kg/m.  General appearance:  Normal Thyroid:  Symmetrical, normal in size, without palpable masses or nodularity. Respiratory  Auscultation:  Clear without wheezing or rhonchi Cardiovascular  Auscultation:  Regular rate, without rubs, murmurs or gallops  Edema/varicosities:  Not grossly evident Abdominal  Soft,nontender, without masses, guarding or rebound.  Liver/spleen:  No organomegaly noted  Hernia:  None appreciated  Skin  Inspection:  Grossly normal Breasts: Examined lying and sitting.   Right: Without masses, retractions, nipple discharge or axillary adenopathy.   Left: Without masses, retractions, nipple discharge or axillary adenopathy. Pelvic: External genitalia:  no lesions              Urethra:  normal appearing urethra with no masses, tenderness or lesions              Bartholins and Skenes: normal                  Vagina: normal appearing vagina with normal color and discharge, no lesions              Cervix: absent Bimanual Exam:  Uterus:  absent              Adnexa: no mass, fullness, tenderness              Rectovaginal: Deferred              Anus:  large, non-bleeding hemorrhoid present   Patient informed chaperone available to be present for breast and pelvic exam. Patient has requested no chaperone to be present. Patient has been advised what will be completed during breast and pelvic exam.   Assessment/Plan:  47 y.o. G3P2 for annual exam.   Well female exam with routine gynecological exam - Education provided on SBEs, importance of preventative screenings, current guidelines, high calcium diet, regular exercise, and multivitamin daily. Labs with PCP.   External hemorrhoids - will follow up with referrals coordinator on previous referral.   Screening for cervical cancer - Normal Pap history. No longer screening per guidelines.   Screening for breast cancer - Normal mammogram history.  Continue annual screenings.  Normal breast exam today.  Screening for colon cancer - 2019 colonoscopy. Will repeat at GI's recommended interval.   Return in about 1 year (around 09/28/2024) for Annual.     Olivia Mackie DNP, 3:00 PM 09/29/2023

## 2023-10-12 ENCOUNTER — Ambulatory Visit: Payer: BC Managed Care – PPO | Admitting: Internal Medicine

## 2023-10-12 ENCOUNTER — Encounter: Payer: Self-pay | Admitting: Internal Medicine

## 2023-10-12 VITALS — BP 138/86 | HR 65 | Temp 98.3°F | Resp 16 | Ht 65.0 in | Wt 187.2 lb

## 2023-10-12 DIAGNOSIS — Z7985 Long-term (current) use of injectable non-insulin antidiabetic drugs: Secondary | ICD-10-CM | POA: Diagnosis not present

## 2023-10-12 DIAGNOSIS — N946 Dysmenorrhea, unspecified: Secondary | ICD-10-CM | POA: Insufficient documentation

## 2023-10-12 DIAGNOSIS — N920 Excessive and frequent menstruation with regular cycle: Secondary | ICD-10-CM | POA: Insufficient documentation

## 2023-10-12 DIAGNOSIS — E118 Type 2 diabetes mellitus with unspecified complications: Secondary | ICD-10-CM | POA: Diagnosis not present

## 2023-10-12 DIAGNOSIS — I10 Essential (primary) hypertension: Secondary | ICD-10-CM

## 2023-10-12 DIAGNOSIS — E785 Hyperlipidemia, unspecified: Secondary | ICD-10-CM | POA: Diagnosis not present

## 2023-10-12 DIAGNOSIS — Z23 Encounter for immunization: Secondary | ICD-10-CM

## 2023-10-12 LAB — BASIC METABOLIC PANEL
BUN: 9 mg/dL (ref 6–23)
CO2: 29 meq/L (ref 19–32)
Calcium: 10 mg/dL (ref 8.4–10.5)
Chloride: 105 meq/L (ref 96–112)
Creatinine, Ser: 0.8 mg/dL (ref 0.40–1.20)
GFR: 87.46 mL/min (ref >60.00)
Glucose, Bld: 103 mg/dL — ABNORMAL HIGH (ref 70–99)
Potassium: 3.5 meq/L (ref 3.5–5.1)
Sodium: 140 meq/L (ref 135–145)

## 2023-10-12 LAB — HEMOGLOBIN A1C: Hgb A1c MFr Bld: 5.4 % (ref 4.6–6.5)

## 2023-10-12 MED ORDER — INDAPAMIDE 1.25 MG PO TABS
1.2500 mg | ORAL_TABLET | Freq: Every day | ORAL | 1 refills | Status: DC
Start: 2023-10-12 — End: 2024-04-11

## 2023-10-12 NOTE — Patient Instructions (Signed)
Hypertension, Adult High blood pressure (hypertension) is when the force of blood pumping through the arteries is too strong. The arteries are the blood vessels that carry blood from the heart throughout the body. Hypertension forces the heart to work harder to pump blood and may cause arteries to become narrow or stiff. Untreated or uncontrolled hypertension can lead to a heart attack, heart failure, a stroke, kidney disease, and other problems. A blood pressure reading consists of a higher number over a lower number. Ideally, your blood pressure should be below 120/80. The first ("top") number is called the systolic pressure. It is a measure of the pressure in your arteries as your heart beats. The second ("bottom") number is called the diastolic pressure. It is a measure of the pressure in your arteries as the heart relaxes. What are the causes? The exact cause of this condition is not known. There are some conditions that result in high blood pressure. What increases the risk? Certain factors may make you more likely to develop high blood pressure. Some of these risk factors are under your control, including: Smoking. Not getting enough exercise or physical activity. Being overweight. Having too much fat, sugar, calories, or salt (sodium) in your diet. Drinking too much alcohol. Other risk factors include: Having a personal history of heart disease, diabetes, high cholesterol, or kidney disease. Stress. Having a family history of high blood pressure and high cholesterol. Having obstructive sleep apnea. Age. The risk increases with age. What are the signs or symptoms? High blood pressure may not cause symptoms. Very high blood pressure (hypertensive crisis) may cause: Headache. Fast or irregular heartbeats (palpitations). Shortness of breath. Nosebleed. Nausea and vomiting. Vision changes. Severe chest pain, dizziness, and seizures. How is this diagnosed? This condition is diagnosed by  measuring your blood pressure while you are seated, with your arm resting on a flat surface, your legs uncrossed, and your feet flat on the floor. The cuff of the blood pressure monitor will be placed directly against the skin of your upper arm at the level of your heart. Blood pressure should be measured at least twice using the same arm. Certain conditions can cause a difference in blood pressure between your right and left arms. If you have a high blood pressure reading during one visit or you have normal blood pressure with other risk factors, you may be asked to: Return on a different day to have your blood pressure checked again. Monitor your blood pressure at home for 1 week or longer. If you are diagnosed with hypertension, you may have other blood or imaging tests to help your health care provider understand your overall risk for other conditions. How is this treated? This condition is treated by making healthy lifestyle changes, such as eating healthy foods, exercising more, and reducing your alcohol intake. You may be referred for counseling on a healthy diet and physical activity. Your health care provider may prescribe medicine if lifestyle changes are not enough to get your blood pressure under control and if: Your systolic blood pressure is above 130. Your diastolic blood pressure is above 80. Your personal target blood pressure may vary depending on your medical conditions, your age, and other factors. Follow these instructions at home: Eating and drinking  Eat a diet that is high in fiber and potassium, and low in sodium, added sugar, and fat. An example of this eating plan is called the DASH diet. DASH stands for Dietary Approaches to Stop Hypertension. To eat this way: Eat   plenty of fresh fruits and vegetables. Try to fill one half of your plate at each meal with fruits and vegetables. Eat whole grains, such as whole-wheat pasta, brown rice, or whole-grain bread. Fill about one  fourth of your plate with whole grains. Eat or drink low-fat dairy products, such as skim milk or low-fat yogurt. Avoid fatty cuts of meat, processed or cured meats, and poultry with skin. Fill about one fourth of your plate with lean proteins, such as fish, chicken without skin, beans, eggs, or tofu. Avoid pre-made and processed foods. These tend to be higher in sodium, added sugar, and fat. Reduce your daily sodium intake. Many people with hypertension should eat less than 1,500 mg of sodium a day. Do not drink alcohol if: Your health care provider tells you not to drink. You are pregnant, may be pregnant, or are planning to become pregnant. If you drink alcohol: Limit how much you have to: 0-1 drink a day for women. 0-2 drinks a day for men. Know how much alcohol is in your drink. In the [place] one drink equals one 12 oz bottle of beer (355 mL), one 5 oz glass of wine (148 mL), or one 1 oz glass of hard liquor (44 mL). Lifestyle  Work with your health care provider to maintain a healthy body weight or to lose weight. Ask what an ideal weight is for you. Get at least 30 minutes of exercise that causes your heart to beat faster (aerobic exercise) most days of the week. Activities may include walking, swimming, or biking. Include exercise to strengthen your muscles (resistance exercise), such as Pilates or lifting weights, as part of your weekly exercise routine. Try to do these types of exercises for 30 minutes at least 3 days a week. Do not use any products that contain nicotine or tobacco. These products include cigarettes, chewing tobacco, and vaping devices, such as e-cigarettes. If you need help quitting, ask your health care provider. Monitor your blood pressure at home as told by your health care provider. Keep all follow-up visits. This is important. Medicines Take over-the-counter and prescription medicines only as told by your health care provider. Follow directions carefully. Blood  pressure medicines must be taken as prescribed. Do not skip doses of blood pressure medicine. Doing this puts you at risk for problems and can make the medicine less effective. Ask your health care provider about side effects or reactions to medicines that you should watch for. Contact a health care provider if you: Think you are having a reaction to a medicine you are taking. Have headaches that keep coming back (recurring). Feel dizzy. Have swelling in your ankles. Have trouble with your vision. Get help right away if you: Develop a severe headache or confusion. Have unusual weakness or numbness. Feel faint. Have severe pain in your chest or abdomen. Vomit repeatedly. Have trouble breathing. These symptoms may be an emergency. Get help right away. Call 911. Do not wait to see if the symptoms will go away. Do not drive yourself to the hospital. Summary Hypertension is when the force of blood pumping through your arteries is too strong. If this condition is not controlled, it may put you at risk for serious complications. Your personal target blood pressure may vary depending on your medical conditions, your age, and other factors. For most people, a normal blood pressure is less than 120/80. Hypertension is treated with lifestyle changes, medicines, or a combination of both. Lifestyle changes include losing weight, eating a healthy,   low-sodium diet, exercising more, and limiting alcohol. This information is not intended to replace advice given to you by your health care provider. Make sure you discuss any questions you have with your health care provider. Document Revised: 10/13/2021 Document Reviewed: 10/13/2021 Elsevier Patient Education  2024 [employer] Inc.  

## 2023-10-12 NOTE — Progress Notes (Unsigned)
Subjective:  Patient ID: Samantha Ball, female    DOB: February 21, 1976  Age: 47 y.o. MRN: [id number]  CC: Hypertension and Diabetes   HPI Samantha Ball presents for f/up ---  Discussed the use of AI scribe software for clinical note transcription with the patient, who gave verbal consent to proceed.  History of Present Illness   The patient, on Bystolic for hypertension, reports no symptoms of high or low blood pressure, such as dizziness, lightheadedness, chest pain, or shortness of breath. She denies any side effects from the medication, including irregular heartbeat, syncope, or edema.  The patient's activity level is low, but she occasionally walks for almost two miles without any reported issues with endurance. The last walk of this distance was a week prior to the consultation.  The patient denies any gastrointestinal symptoms such as abdominal pain, nausea, vomiting, burping, belching, or diarrhea. However, she reports a longstanding issue with constipation, which has not worsened recently.       Outpatient Medications Prior to Visit  Medication Sig Dispense Refill   BIOTIN 5000 PO Take 5,000 mcg by mouth daily.     cholecalciferol (VITAMIN D3) 25 MCG (1000 UNIT) tablet Take 1,000 Units by mouth as needed.     Cold Sore Products (L-LYSINE EX)      Cranberry 400 MG CAPS Take 800 mg by mouth daily.     Cyanocobalamin (B-12) 1000 MCG CAPS      levocetirizine (XYZAL) 5 MG tablet TAKE 1 TABLET BY MOUTH EVERY DAY IN THE EVENING 90 tablet 1   naproxen (NAPROSYN) 500 MG tablet TAKE 1 TABLET BY MOUTH TWICE A DAY WITH FOOD 60 tablet 3   omeprazole (PRILOSEC) 40 MG capsule TAKE 1 CAPSULE (40 MG TOTAL) BY MOUTH DAILY. 90 capsule 0   Rimegepant Sulfate (NURTEC) 75 MG TBDP Take 1 tablet by mouth every other day. (Patient taking differently: Take 1 tablet by mouth as needed.) 46 tablet 1   nebivolol (BYSTOLIC) 5 MG tablet Take 1 tablet (5 mg total) by mouth daily. Follow-up appt due in Sept  must see provider for future refills 90 tablet 0   rosuvastatin (CRESTOR) 10 MG tablet Take 1 tablet (10 mg total) by mouth daily. 90 tablet 1   tirzepatide (MOUNJARO) 7.5 MG/0.5ML Pen Inject 7.5 mg into the skin once a week. 6 mL 1   methenamine (HIPREX) 1 g tablet Take 1 g by mouth 2 (two) times daily.     Vitamin D, Ergocalciferol, (DRISDOL) 1.25 MG (50000 UNIT) CAPS capsule Take 1 capsule (50,000 Units total) by mouth every 7 (seven) days. 12 capsule 0   No facility-administered medications prior to visit.    ROS Review of Systems  Constitutional: Negative.  Negative for diaphoresis and fatigue.  HENT: Negative.    Eyes:  Negative for visual disturbance.  Respiratory:  Negative for cough, chest tightness, shortness of breath and wheezing.   Cardiovascular:  Negative for chest pain, palpitations and leg swelling.  Gastrointestinal: Negative.  Negative for abdominal pain, diarrhea and nausea.  Genitourinary: Negative.  Negative for difficulty urinating.  Musculoskeletal: Negative.  Negative for arthralgias.  Skin: Negative.  Negative for color change.  Neurological: Negative.   Hematological:  Negative for adenopathy. Does not bruise/bleed easily.  Psychiatric/Behavioral: Negative.      Objective:  BP 138/86 (BP Location: Left Arm, Patient Position: Sitting, Cuff Size: Normal)   Pulse 65   Temp 98.3 F (36.8 C) (Oral)   Resp 16   Ht  5\' 5"  (1.651 m)   Wt 187 lb 3.2 oz (84.9 kg)   LMP 11/04/2021   SpO2 97%   BMI 31.15 kg/m   BP Readings from Last 3 Encounters:  10/12/23 138/86  09/29/23 132/80  06/28/23 103/72    Wt Readings from Last 3 Encounters:  10/12/23 187 lb 3.2 oz (84.9 kg)  09/29/23 182 lb (82.6 kg)  06/28/23 179 lb 3.2 oz (81.3 kg)    Physical Exam Vitals reviewed.  HENT:     Mouth/Throat:     Mouth: Mucous membranes are moist.  Eyes:     General: No scleral icterus.    Conjunctiva/sclera: Conjunctivae normal.  Cardiovascular:     Rate and Rhythm:  Regular rhythm. Bradycardia present.     Heart sounds: Normal heart sounds and S1 normal. No murmur heard.    Comments: EKG --- SB, 57 bpm T wave changes are not new No LVH or Q waves Unchanged Pulmonary:     Effort: Pulmonary effort is normal.     Breath sounds: No stridor. No wheezing, rhonchi or rales.  Abdominal:     General: Abdomen is flat.     Palpations: There is no mass.     Tenderness: There is no abdominal tenderness. There is no guarding.     Hernia: No hernia is present.  Musculoskeletal:     Cervical back: Neck supple.     Right lower leg: No edema.     Left lower leg: No edema.  Lymphadenopathy:     Cervical: No cervical adenopathy.  Skin:    General: Skin is warm and dry.  Neurological:     General: No focal deficit present.     Mental Status: She is alert. Mental status is at baseline.  Psychiatric:        Mood and Affect: Mood normal.        Behavior: Behavior normal.     Lab Results  Component Value Date   WBC 6.9 06/28/2023   HGB 12.5 06/28/2023   HCT 37.8 06/28/2023   PLT 268 06/28/2023   GLUCOSE 103 (H) 10/12/2023   CHOL 119 03/17/2023   TRIG 97.0 03/17/2023   HDL 40.20 03/17/2023   LDLDIRECT 99.0 12/21/2016   LDLCALC 59 03/17/2023   ALT 11 06/28/2023   AST 11 06/28/2023   NA 140 10/12/2023   K 3.5 10/12/2023   CL 105 10/12/2023   CREATININE 0.80 10/12/2023   BUN 9 10/12/2023   CO2 29 10/12/2023   TSH 2.98 03/17/2023   HGBA1C 5.4 10/12/2023   MICROALBUR 6.4 (H) 03/17/2023    MM 3D SCREENING MAMMOGRAM BILATERAL BREAST  Result Date: 03/30/2023 CLINICAL DATA:  Screening. EXAM: DIGITAL SCREENING BILATERAL MAMMOGRAM WITH TOMOSYNTHESIS AND CAD TECHNIQUE: Bilateral screening digital craniocaudal and mediolateral oblique mammograms were obtained. Bilateral screening digital breast tomosynthesis was performed. The images were evaluated with computer-aided detection. COMPARISON:  Previous exam(s). ACR Breast Density Category b: There are  scattered areas of fibroglandular density. FINDINGS: There are no findings suspicious for malignancy. IMPRESSION: No mammographic evidence of malignancy. A result letter of this screening mammogram will be mailed directly to the patient. RECOMMENDATION: Screening mammogram in one year. (Code:SM-B-01Y) BI-RADS CATEGORY  1: Negative. Electronically Signed   By: Amie Portland M.D.   On: 03/30/2023 11:12    Assessment & Plan:   Type II diabetes mellitus with manifestations (HCC)- Her blood sugar is well controlled. -     Hemoglobin A1c; Future -     Basic  metabolic panel; Future -     Tirzepatide; Inject 7.5 mg into the skin once a week.  Dispense: 6 mL; Refill: 1  Essential hypertension, benign- She has not achieved her BP goal. Will add a thiazide diuretic. -     Basic metabolic panel; Future -     EKG 12-Lead -     Indapamide; Take 1 tablet (1.25 mg total) by mouth daily.  Dispense: 90 tablet; Refill: 1 -     Nebivolol HCl; Take 1 tablet (5 mg total) by mouth daily.  Dispense: 90 tablet; Refill: 1  Need for immunization against influenza -     Flu vaccine trivalent PF, 6mos and older(Flulaval,Afluria,Fluarix,Fluzone)  Hyperlipidemia LDL goal <130 - LDL goal achieved. Doing well on the statin  -     Rosuvastatin Calcium; Take 1 tablet (10 mg total) by mouth daily.  Dispense: 90 tablet; Refill: 1     Follow-up: Return in about 6 months (around 04/11/2024).  Sanda Linger, MD

## 2023-10-14 ENCOUNTER — Other Ambulatory Visit: Payer: Self-pay | Admitting: Internal Medicine

## 2023-10-14 DIAGNOSIS — E118 Type 2 diabetes mellitus with unspecified complications: Secondary | ICD-10-CM

## 2023-10-14 MED ORDER — TIRZEPATIDE 7.5 MG/0.5ML ~~LOC~~ SOAJ: 7.5000 mg | SUBCUTANEOUS | 1 refills | Status: DC

## 2023-10-17 MED ORDER — NEBIVOLOL HCL 5 MG PO TABS: 5.0000 mg | ORAL_TABLET | Freq: Every day | ORAL | 1 refills | Status: DC

## 2023-10-17 MED ORDER — ROSUVASTATIN CALCIUM 10 MG PO TABS: 10.0000 mg | ORAL_TABLET | Freq: Every day | ORAL | 1 refills | Status: DC

## 2023-10-24 ENCOUNTER — Encounter: Payer: Self-pay | Admitting: Internal Medicine

## 2023-10-25 ENCOUNTER — Telehealth: Payer: Self-pay

## 2023-10-25 ENCOUNTER — Other Ambulatory Visit (HOSPITAL_COMMUNITY): Payer: Self-pay

## 2023-10-25 NOTE — Telephone Encounter (Signed)
Could somebody please check on the status of this patients Mounjaro PA ? The last thing I see about it is in June of this year.

## 2023-10-25 NOTE — Telephone Encounter (Signed)
I have spoke with the patients plans and have verbally done a P/A on the phone at  [phone]. At this time I have requested an Urgent P/A determination as well as sent over chartnotes/addt info due to the P/A been done verbally.

## 2023-10-26 NOTE — Telephone Encounter (Signed)
Thanks so much, Please keep me updated!

## 2023-10-27 ENCOUNTER — Other Ambulatory Visit (HOSPITAL_COMMUNITY): Payer: Self-pay

## 2023-10-27 NOTE — Telephone Encounter (Signed)
Thanks so much for all of your help, I'll be waiting !

## 2023-10-27 NOTE — Telephone Encounter (Signed)
Fyi sent to CMA  Hi Jazunique,                           The plan has requested more addt info as of today 11/7, I have sent this back to the plan as of today again. I will let you know more as I get more information. We should get a determination after this addt fax I've sent.

## 2023-10-27 NOTE — Telephone Encounter (Signed)
Hi Samantha Ball,                         The plan has requested more addt info as of today 11/7, I have sent this back to the plan as of today again. I will let you know more as I get more information. We should get a determination after this addt fax I've sent.

## 2023-10-31 NOTE — Telephone Encounter (Signed)
Pharmacy Patient Advocate Encounter  Received notification from [facility] that Prior Authorization for Samantha Ball has been APPROVED from 09/27/23 to 10/26/24   PA #/Case ID/Reference #: [id number]

## 2023-10-31 NOTE — Telephone Encounter (Signed)
Unable to reach patient. Left a very detailed message in regards to her medication being approved.

## 2023-11-01 DIAGNOSIS — K641 Second degree hemorrhoids: Secondary | ICD-10-CM | POA: Diagnosis not present

## 2023-11-01 DIAGNOSIS — K5900 Constipation, unspecified: Secondary | ICD-10-CM | POA: Diagnosis not present

## 2023-11-25 ENCOUNTER — Other Ambulatory Visit: Payer: Self-pay | Admitting: Internal Medicine

## 2023-11-25 DIAGNOSIS — K2100 Gastro-esophageal reflux disease with esophagitis, without bleeding: Secondary | ICD-10-CM

## 2023-12-20 NOTE — Progress Notes (Signed)
 Office Visit Note  Patient: Samantha Ball             Date of Birth: 1976/09/19           MRN: [id number]             PCP: Samantha Debby CROME, MD Referring: Samantha Debby CROME, MD Visit Date: 01/03/2024 Occupation: @GUAROCC @  Subjective:  Dry mouth and dry eyes  History of Present Illness: Samantha Ball is a 47 y.o. female with positive ANA, sicca symptoms and osteoarthritis.  She returns today after her last visit in July 2024.  She continues to have dry mouth and dry eyes.  She continues to have pain and stiffness in her bilateral hands, bilateral knees.  He denies any history of oral ulcers, nasal ulcers, malar rash, photosensitivity, Raynaud's, or lymphadenopathy.  She has been having discomfort in her left index finger for the last couple of days.  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  She complains of discomfort over the right trochanteric region.  She states the pain has been radiating down to right lower extremity.  She says symptoms have been going on for the last 2 months now.    Activities of Daily Living:  Patient reports morning stiffness for 10 minutes.   Patient Reports nocturnal pain.  Difficulty dressing/grooming: Denies Difficulty climbing stairs: Denies Difficulty getting out of chair: Denies Difficulty using hands for taps, buttons, cutlery, and/or writing: Denies  Review of Systems  Constitutional:  Positive for fatigue.  HENT:  Positive for mouth sores and mouth dryness.   Eyes:  Positive for dryness.  Respiratory: Negative.  Negative for shortness of breath.   Cardiovascular: Negative.  Negative for chest pain and palpitations.  Gastrointestinal:  Positive for constipation. Negative for blood in stool and diarrhea.  Endocrine: Negative.  Negative for increased urination.  Genitourinary: Negative.  Negative for involuntary urination.  Musculoskeletal:  Positive for joint pain, joint pain and morning stiffness. Negative for gait problem, joint swelling, myalgias,  muscle weakness, muscle tenderness and myalgias.  Skin: Negative.  Negative for color change, rash, hair loss and sensitivity to sunlight.  Allergic/Immunologic: Negative for susceptible to infections.  Neurological:  Positive for headaches. Negative for dizziness.  Hematological: Negative.  Negative for swollen glands.  Psychiatric/Behavioral: Negative.  Negative for depressed mood and sleep disturbance. The patient is not nervous/anxious.     PMFS History:  Patient Active Problem List   Diagnosis Date Noted   Dysmenorrhea 10/12/2023   Hypertensive disorder 10/12/2023   Menorrhagia 10/12/2023   Cervical cancer screening 05/13/2021   Type II diabetes mellitus with manifestations (HCC) 05/13/2021   Onychomycosis 04/27/2021   Sensorineural hearing loss 04/27/2021   Migraine without aura and without status migrainosus, not intractable 11/17/2020   Routine general medical examination at a health care facility 04/01/2020   Intrinsic eczema 04/01/2020   Arthralgia 02/01/2019   Vitamin D  deficiency disease 11/21/2018   B12 deficiency 11/21/2018   Perennial allergic rhinitis 03/06/2018   Seasonal allergic rhinitis due to pollen 05/09/2017   Hyperlipidemia LDL goal <130 12/21/2016   Snoring 11/09/2016   Other iron deficiency anemias 12/17/2014   GERD (gastroesophageal reflux disease) 01/09/2014   Constipation 01/09/2014   Positive ANA (antinuclear antibody) 07/16/2013   Essential hypertension, benign 07/11/2013   Visit for screening mammogram 07/11/2013   Obesity (BMI 30.0-34.9) 07/11/2013    Past Medical History:  Diagnosis Date   Anemia    Arthritis    oa   DM  Type 2    diet controlled   Fibroids    Dr. Armond, per patient.    GERD (gastroesophageal reflux disease)    Hyperlipidemia    Hypertension    Internal hemorrhoid    Intrinsic eczema 04/01/2020   migraine    Obesity    Sleep apnea    not currently using CPAP did not tolerate cpap   Urticaria    Vitamin B  deficiency    Wears glasses 11/17/2021   for reading   Wears hearing aid in both ears 11/17/2021    Family History  Problem Relation Age of Onset   Heart disease Father    Allergic rhinitis Daughter    Eczema Daughter    Breast cancer Maternal Aunt    Diabetes Maternal Aunt    Breast cancer Maternal Grandmother    Breast cancer Cousin    Alcohol abuse Neg Hx    COPD Neg Hx    Depression Neg Hx    Drug abuse Neg Hx    Early death Neg Hx    Hearing loss Neg Hx    Hyperlipidemia Neg Hx    Hypertension Neg Hx    Kidney disease Neg Hx    Stroke Neg Hx    Colon cancer Neg Hx    Rectal cancer Neg Hx    Stomach cancer Neg Hx    Past Surgical History:  Procedure Laterality Date   colonscopy     x 2 last done 2021 or 2019   CYSTOSCOPY N/A 11/24/2021   Procedure: CYSTOSCOPY;  Surgeon: Samantha JAYSON Nikki Bobie FORBES, MD;  Location: [facility];  Service: Gynecology;  Laterality: N/A;   DILATION AND CURETTAGE OF UTERUS N/A 11/19/2000   TOTAL LAPAROSCOPIC HYSTERECTOMY WITH SALPINGECTOMY Bilateral 11/24/2021   Procedure: TOTAL LAPAROSCOPIC HYSTERECTOMY WITHBILATERAL SALPINGECTOMY,;  Surgeon: Samantha JAYSON Nikki Bobie FORBES, MD;  Location: [facility];  Service: Gynecology;  Laterality: Bilateral;   TUBAL LIGATION  2004   wisdom teeth  12/20/1998   Social History   Social History Narrative   Not on file   Immunization History  Administered Date(s) Administered   Influenza Inj Mdck Quad With Preservative 09/01/2022   Influenza, Seasonal, Injecte, Preservative Fre 10/12/2023   Influenza,inj,Quad PF,6+ Mos 02/04/2016, 11/09/2016, 01/18/2018, 11/20/2018, 09/12/2019, 11/17/2020, 09/17/2021   Influenza-Unspecified 01/09/2014   PFIZER(Purple Top)SARS-COV-2 Vaccination 03/20/2020, 04/17/2020, 01/29/2021   PNEUMOCOCCAL CONJUGATE-20 09/17/2021   Tdap 04/25/2013, 03/17/2023     Objective: Vital Signs: BP 115/83   Pulse 80   Resp 16   Ht 5' 5 (1.651 m)   Wt 180 lb  (81.6 kg)   LMP 11/04/2021   BMI 29.95 kg/m    Physical Exam Vitals and nursing note reviewed.  Constitutional:      Appearance: She is well-developed.  HENT:     Head: Normocephalic and atraumatic.  Eyes:     Conjunctiva/sclera: Conjunctivae normal.  Cardiovascular:     Rate and Rhythm: Normal rate and regular rhythm.     Heart sounds: Normal heart sounds.  Pulmonary:     Effort: Pulmonary effort is normal.     Breath sounds: Normal breath sounds.  Abdominal:     General: Bowel sounds are normal.     Palpations: Abdomen is soft.  Musculoskeletal:     Cervical back: Normal range of motion.  Lymphadenopathy:     Cervical: No cervical adenopathy.  Skin:    General: Skin is warm and dry.     Capillary Refill:  Capillary refill takes less than 2 seconds.  Neurological:     Mental Status: She is alert and oriented to person, place, and time.  Psychiatric:        Behavior: Behavior normal.      Musculoskeletal Exam: Cervical, thoracic and lumbar spine 1 good range of motion.  Shoulder joints, elbow joints, wrist joints, MCPs PIPs and DIPs were in good range of motion.  She has mild tenderness over the left index finger extensor tendon.  She had tenderness over right trochanteric bursa.  Hip joints and knee joints with good range of motion without any warmth swelling or effusion.  There was no tenderness over ankles or MTPs.  CDAI Exam: CDAI Score: -- Patient Global: --; Provider Global: -- Swollen: --; Tender: -- Joint Exam 01/03/2024   No joint exam has been documented for this visit   There is currently no information documented on the homunculus. Go to the Rheumatology activity and complete the homunculus joint exam.  Investigation: No additional findings.  Imaging: No results found.  Recent Labs: Lab Results  Component Value Date   WBC 6.9 06/28/2023   HGB 12.5 06/28/2023   PLT 268 06/28/2023   NA 140 10/12/2023   K 3.5 10/12/2023   CL 105 10/12/2023   CO2  29 10/12/2023   GLUCOSE 103 (H) 10/12/2023   BUN 9 10/12/2023   CREATININE 0.80 10/12/2023   BILITOT 0.5 06/28/2023   ALKPHOS 50 01/11/2020   AST 11 06/28/2023   ALT 11 06/28/2023   PROT 7.3 06/28/2023   ALBUMIN 4.2 01/11/2020   CALCIUM  10.0 10/12/2023   GFRAA 103 06/26/2021    Speciality Comments: No specialty comments available.  Procedures:  Large Joint Inj: R greater trochanter on 01/03/2024 2:24 PM Indications: pain Details: 27 G 1.5 in needle, lateral approach  Arthrogram: No  Medications: 40 mg triamcinolone  acetonide 40 MG/ML; 1.5 mL lidocaine  1 % Aspirate: 0 mL Outcome: tolerated well, no immediate complications Procedure, treatment alternatives, risks and benefits explained, specific risks discussed. Consent was given by the patient. Immediately prior to procedure a time out was called to verify the correct patient, procedure, equipment, support staff and site/side marked as required. Patient was prepped and draped in the usual sterile fashion.     Allergies: Amlodipine   Assessment / Plan:     Visit Diagnoses: Sicca syndrome (HCC) - Positive ANA, ENA negative, C3-C4 normal.  Sicca symptoms manageable with over-the-counter products. -She continues to have dry mouth and dry eyes.  Over-the-counter products were discussed at length.  Will check labs with her next visit.  She denies any history of oral ulcers, nasal ulcers, malar rash, photosensitivity, Raynaud's or lymphadenopathy.  No inflammatory arthritis was noted.  Plan: CBC with Differential/Platelet, COMPLETE METABOLIC PANEL WITH GFR, Protein / creatinine ratio, urine, Anti-DNA antibody, double-stranded, C3 and C4, Sedimentation rate, ANA  Primary osteoarthritis of both hands-she had bilateral PIP and DIP mild thickening.  No synovitis was noted.  She has been having some discomfort over the left index extensor tendon most like related to the computer use.  Use of topical Voltaren  gel was discussed.  A handout on hand  exercises was given.  Trochanteric bursitis, right hip-she has been having pain and discomfort over the right trochanteric region.  She tenderness on palpation.  A handout on IT band stretches was given.  Per patient's request right trochanteric region was injected with lidocaine  and Kenalog  as described above.  She tolerated the procedure well.  Postprocedure instructions  were given.  Primary osteoarthritis of both knees-she continue to have some discomfort in her knee joints.  No warmth swelling or effusion was noted.  A handout on knee joint exercises was given.  Essential hypertension, benign-blood pressure was normal today.  Hyperlipidemia LDL goal <130  History of diabetes mellitus-compared to close level has been running normal.  I advised her to monitor blood glucose level for the next 2 days after the cortisone injection.  Other insomnia  Vitamin D  deficiency  B12 deficiency  Orders: Orders Placed This Encounter  Procedures   Large Joint Inj   CBC with Differential/Platelet   COMPLETE METABOLIC PANEL WITH GFR   Protein / creatinine ratio, urine   Anti-DNA antibody, double-stranded   C3 and C4   Sedimentation rate   ANA   No orders of the defined types were placed in this encounter.   Follow-Up Instructions: Return in about 6 months (around 07/02/2024) for Osteoarthritis, +ANA.   Maya Nash, MD  Note - This record has been created using Animal nutritionist.  Chart creation errors have been sought, but may not always  have been located. Such creation errors do not reflect on  the standard of medical care.

## 2023-12-27 DIAGNOSIS — E119 Type 2 diabetes mellitus without complications: Secondary | ICD-10-CM | POA: Diagnosis not present

## 2023-12-27 LAB — HM DIABETES EYE EXAM

## 2024-01-03 ENCOUNTER — Ambulatory Visit: Payer: BC Managed Care – PPO | Attending: Rheumatology | Admitting: Rheumatology

## 2024-01-03 ENCOUNTER — Encounter: Payer: Self-pay | Admitting: Rheumatology

## 2024-01-03 VITALS — BP 115/83 | HR 80 | Resp 16 | Ht 65.0 in | Wt 180.0 lb

## 2024-01-03 DIAGNOSIS — G4709 Other insomnia: Secondary | ICD-10-CM

## 2024-01-03 DIAGNOSIS — I10 Essential (primary) hypertension: Secondary | ICD-10-CM

## 2024-01-03 DIAGNOSIS — M7061 Trochanteric bursitis, right hip: Secondary | ICD-10-CM | POA: Diagnosis not present

## 2024-01-03 DIAGNOSIS — E559 Vitamin D deficiency, unspecified: Secondary | ICD-10-CM

## 2024-01-03 DIAGNOSIS — M19041 Primary osteoarthritis, right hand: Secondary | ICD-10-CM | POA: Diagnosis not present

## 2024-01-03 DIAGNOSIS — E538 Deficiency of other specified B group vitamins: Secondary | ICD-10-CM

## 2024-01-03 DIAGNOSIS — M3500 Sicca syndrome, unspecified: Secondary | ICD-10-CM

## 2024-01-03 DIAGNOSIS — M170 Bilateral primary osteoarthritis of knee: Secondary | ICD-10-CM | POA: Diagnosis not present

## 2024-01-03 DIAGNOSIS — M19042 Primary osteoarthritis, left hand: Secondary | ICD-10-CM

## 2024-01-03 DIAGNOSIS — E785 Hyperlipidemia, unspecified: Secondary | ICD-10-CM

## 2024-01-03 DIAGNOSIS — Z8639 Personal history of other endocrine, nutritional and metabolic disease: Secondary | ICD-10-CM

## 2024-01-03 MED ORDER — TRIAMCINOLONE ACETONIDE 40 MG/ML IJ SUSP
40.0000 mg | INTRAMUSCULAR | Status: AC | PRN
  Administered 2024-01-03: 40 mg via INTRA_ARTICULAR

## 2024-01-03 MED ORDER — LIDOCAINE HCL 1 % IJ SOLN
1.5000 mL | INTRAMUSCULAR | Status: AC | PRN
  Administered 2024-01-03: 1.5 mL

## 2024-01-03 NOTE — Patient Instructions (Addendum)
Hand Exercises Hand exercises can be helpful for almost anyone. They can strengthen your hands and improve flexibility and movement. The exercises can also increase blood flow to the hands. These results can make your work and daily tasks easier for you. Hand exercises can be especially helpful for people who have joint pain from arthritis or nerve damage from using their hands over and over. These exercises can also help people who injure a hand. Exercises Most of these hand exercises are gentle stretching and motion exercises. It is usually safe to do them often throughout the day. Warming up your hands before exercise may help reduce stiffness. You can do this with gentle massage or by placing your hands in warm water for 10-15 minutes. It is normal to feel some stretching, pulling, tightness, or mild discomfort when you begin new exercises. In time, this will improve. Remember to always be careful and stop right away if you feel sudden, very bad pain or your pain gets worse. You want to get better and be safe. Ask your health care provider which exercises are safe for you. Do exercises exactly as told by your provider and adjust them as told. Do not begin these exercises until told by your provider. Knuckle bend or "claw" fist  Stand or sit with your arm, hand, and all five fingers pointed straight up. Make sure to keep your wrist straight. Gently bend your fingers down toward your palm until the tips of your fingers are touching your palm. Keep your big knuckle straight and only bend the small knuckles in your fingers. Hold this position for 10 seconds. Straighten your fingers back to your starting position. Repeat this exercise 5-10 times with each hand. Full finger fist  Stand or sit with your arm, hand, and all five fingers pointed straight up. Make sure to keep your wrist straight. Gently bend your fingers into your palm until the tips of your fingers are touching the middle of your  palm. Hold this position for 10 seconds. Extend your fingers back to your starting position, stretching every joint fully. Repeat this exercise 5-10 times with each hand. Straight fist  Stand or sit with your arm, hand, and all five fingers pointed straight up. Make sure to keep your wrist straight. Gently bend your fingers at the big knuckle, where your fingers meet your hand, and at the middle knuckle. Keep the knuckle at the tips of your fingers straight and try to touch the bottom of your palm. Hold this position for 10 seconds. Extend your fingers back to your starting position, stretching every joint fully. Repeat this exercise 5-10 times with each hand. Tabletop  Stand or sit with your arm, hand, and all five fingers pointed straight up. Make sure to keep your wrist straight. Gently bend your fingers at the big knuckle, where your fingers meet your hand, as far down as you can. Keep the small knuckles in your fingers straight. Think of forming a tabletop with your fingers. Hold this position for 10 seconds. Extend your fingers back to your starting position, stretching every joint fully. Repeat this exercise 5-10 times with each hand. Finger spread  Place your hand flat on a table with your palm facing down. Make sure your wrist stays straight. Spread your fingers and thumb apart from each other as far as you can until you feel a gentle stretch. Hold this position for 10 seconds. Bring your fingers and thumb tight together again. Hold this position for 10 seconds. Repeat  this exercise 5-10 times with each hand. Making circles  Stand or sit with your arm, hand, and all five fingers pointed straight up. Make sure to keep your wrist straight. Make a circle by touching the tip of your thumb to the tip of your index finger. Hold for 10 seconds. Then open your hand wide. Repeat this motion with your thumb and each of your fingers. Repeat this exercise 5-10 times with each hand. Thumb  motion  Sit with your forearm resting on a table and your wrist straight. Your thumb should be facing up toward the ceiling. Keep your fingers relaxed as you move your thumb. Lift your thumb up as high as you can toward the ceiling. Hold for 10 seconds. Bend your thumb across your palm as far as you can, reaching the tip of your thumb for the small finger (pinkie) side of your palm. Hold for 10 seconds. Repeat this exercise 5-10 times with each hand. Grip strengthening  Hold a stress ball or other soft ball in the middle of your hand. Slowly increase the pressure, squeezing the ball as much as you can without causing pain. Think of bringing the tips of your fingers into the middle of your palm. All of your finger joints should bend when doing this exercise. Hold your squeeze for 10 seconds, then relax. Repeat this exercise 5-10 times with each hand. Contact a health care provider if: Your hand pain or discomfort gets much worse when you do an exercise. Your hand pain or discomfort does not improve within 2 hours after you exercise. If you have either of these problems, stop doing these exercises right away. Do not do them again unless your provider says that you can. Get help right away if: You develop sudden, severe hand pain or swelling. If this happens, stop doing these exercises right away. Do not do them again unless your provider says that you can. This information is not intended to replace advice given to you by your health care provider. Make sure you discuss any questions you have with your health care provider. Document Revised: 12/21/2022 Document Reviewed: 12/21/2022 Elsevier Patient Education  2024 [employer] Exercises for Chronic Knee Pain Chronic knee pain is pain that lasts longer than 3 months. For most people with chronic knee pain, exercise and weight loss is an important part of treatment. Your health care provider may want you to focus on: Making the muscles that  support your knee stronger. This can take pressure off your knee and reduce pain. Preventing knee stiffness. How far you can move your knee, keeping it there or making it farther. Losing weight (if this applies) to take pressure off your knee, lower your risk for injury, and make it easier for you to exercise. Your provider will help you make an exercise program that fits your needs and physical abilities. Below are simple, low-impact exercises you can do at home. Ask your provider or physical therapist how often you should do your exercise program and how many times to repeat each exercise. General safety tips  Get your provider's approval before doing any exercises. Start slowly and stop any time you feel pain. Do not exercise if your knee pain is flaring up. Warm up first. Stretching a cold muscle can cause an injury. Do 5-10 minutes of easy movement or light stretching before beginning your exercises. Do 5-10 minutes of low-impact activity (like walking or cycling) before starting strengthening exercises. Contact your provider any time you have pain during  or after exercising. Exercise can cause discomfort but should not be painful. It is normal to be a little stiff or sore after exercising. Stretching and range-of-motion exercises Front thigh stretch  Stand up straight and support your body by holding on to a chair or resting one hand on a wall. With your legs straight and close together, bend one knee to lift your heel up toward your butt. Using one hand for support, grab your ankle with your free hand. Pull your foot up closer toward your butt to feel the stretch in front of your thigh. Hold the stretch for 30 seconds. Repeat __________ times. Complete this exercise __________ times a day. Back thigh stretch  Sit on the floor with your back straight and your legs out straight in front of you. Place the palms of your hands on the floor and slide them toward your feet as you bend at the  hip. Try to touch your nose to your knees and feel the stretch in the back of your thighs. Hold for 30 seconds. Repeat __________ times. Complete this exercise __________ times a day. Calf stretch  Stand facing a wall. Place the palms of your hands flat against the wall, arms extended, and lean slightly against the wall. Get into a lunge position with one leg bent at the knee and the other leg stretched out straight behind you. Keep both feet facing the wall and increase the bend in your knee while keeping the heel of the other leg flat on the ground. You should feel the stretch in your calf. Hold for 30 seconds. Repeat __________ times. Complete this exercise __________ times a day. Strengthening exercises Straight leg lift  Lie on your back with one knee bent and the other leg out straight. Slowly lift the straight leg without bending the knee. Lift until your foot is about 12 inches (30 cm) off the floor. Hold for 3-5 seconds and slowly lower your leg. Repeat __________ times. Complete this exercise __________ times a day. Single leg dip  Stand between two chairs and put both hands on the backs of the chairs for support. Extend one leg out straight with your body weight resting on the heel of the standing leg. Slowly bend your standing knee to dip your body to the level that is comfortable for you. Hold for 3-5 seconds. Repeat __________ times. Complete this exercise __________ times a day. Hamstring curls  Stand straight, knees close together, facing the back of a chair. Hold on to the back of a chair with both hands. Keep one leg straight. Bend the other knee while bringing the heel up toward the butt until the knee is bent at a 90-degree angle (right angle). Hold for 3-5 seconds. Repeat __________ times. Complete this exercise __________ times a day. Wall squat  Stand straight with your back, hips, and head against a wall. Step forward one foot at a time with your back  still against the wall. Your feet should be 2 feet (61 cm) from the wall at shoulder width. Keeping your back, hips, and head against the wall, slide down the wall to as close to a sitting position as you can get. Hold for 5-10 seconds, then slowly slide back up. Repeat __________ times. Complete this exercise __________ times a day. Step-ups  Stand in front of a sturdy platform or stool that is about 6 inches (15 cm) high. Slowly step up with your left / right foot, keeping your knee in line with your hip  and foot. Do not let your knee bend so far that you cannot see your toes. Hold on to a chair for balance, but do not use it for support. Slowly unlock your knee and lower yourself to the starting position. Repeat __________ times. Complete this exercise __________ times a day. Contact a health care provider if: Your exercises cause pain. Your pain is worse after you exercise. Your pain prevents you from doing your exercises. This information is not intended to replace advice given to you by your health care provider. Make sure you discuss any questions you have with your health care provider. Document Revised: 12/21/2022 Document Reviewed: 12/21/2022 Elsevier Patient Education  2024 [employer] Iliotibial Band Syndrome Rehab Ask your health care provider which exercises are safe for you. Do exercises exactly as told by your provider and adjust them as told. It's normal to feel mild stretching, pulling, tightness, or discomfort as you do these exercises. Stop right away if you feel sudden pain or your pain gets a lot worse. Do not begin these exercises until told by your provider. Stretching and range-of-motion exercises These exercises warm up your muscles and joints. They also improve the movement and flexibility of your hip and pelvis. Quadriceps stretch, prone  Lie face down (prone) on a firm surface like a bed or padded floor. Bend your left / right knee. Reach back to hold your  ankle or pant leg. If you can't reach your ankle or pant leg, use a belt looped around your foot and grab the belt instead. Gently pull your heel toward your butt. Your knee should not slide out to the side. You should feel a stretch in the front of your thigh and knee, also called the quadriceps. Hold this position for __________ seconds. Repeat __________ times. Complete this exercise __________ times a day. Iliotibial band stretch The iliotibial band is a strip of tissue that runs along the outside of your hip down to your knee. Lie on your side with your left / right leg on top. Bend both knees and grab your left / right ankle. Stretch out your bottom arm to help you balance. Slowly bring your top knee back so your thigh goes behind your back. Slowly lower your top leg toward the floor until you feel a gentle stretch on the outside of your left / right hip and thigh. If you don't feel a stretch and your knee won't go farther, place the heel of your other foot on top of your knee and pull your knee down toward the floor with your foot. Hold this position for __________ seconds. Repeat __________ times. Complete this exercise __________ times a day. Strengthening exercises These exercises build strength and endurance in your hip and pelvis. Endurance means your muscles can keep working even when they're tired. Straight leg raises, side-lying This exercise strengthens the muscles that rotate the leg at the hip and move it away from your body. These muscles are called hip abductors. Lie on your side with your left / right leg on top. Lie so your head, shoulder, hip, and knee line up. You can bend your bottom knee to help you balance. Roll your hips slightly forward so they're stacked directly over each other. Your left / right knee should face forward. Tense the muscles in your outer thigh and hip. Lift your top leg 4-6 inches (10-15 cm) off the ground. Hold this position for __________  seconds. Slowly lower your leg back down to the starting position. Let  your muscles fully relax before doing this exercise again. Repeat __________ times. Complete this exercise __________ times a day. Leg raises, prone This exercise strengthens the muscles that move the hips backward. These muscles are called hip extensors. Lie face down (prone) on your bed or a firm surface. You can put a pillow under your hips for comfort and to support your lower back. Bend your left / right knee so your foot points straight up toward the ceiling. Keep the other leg straight and behind you. Squeeze your butt muscles. Lift your left / right thigh off the firm surface. Do not let your back arch. Tense your thigh muscle as hard as you can without having more knee pain. Hold this position for __________ seconds. Slowly lower your leg to the starting position. Allow your leg to relax all the way. Repeat __________ times. Complete this exercise __________ times a day. Hip hike  Stand sideways on a bottom step. Place your feet so that your left / right leg is on the step, and the other foot is hanging off the side. If you need support for balance, hold onto a railing or wall. Keep your knees straight and your abdomen square, meaning your hips are level. Then, lift your left / right hip up toward the ceiling. Slowly let your leg that's hanging off the step lower towards the floor. Your foot should get closer to the ground. Do not lean or bend your knees during this movement. Repeat __________ times. Complete this exercise __________ times a day. This information is not intended to replace advice given to you by your health care provider. Make sure you discuss any questions you have with your health care provider. Document Revised: 02/18/2023 Document Reviewed: 02/18/2023 Elsevier Patient Education  2024 [employer].

## 2024-01-15 ENCOUNTER — Other Ambulatory Visit: Payer: Self-pay | Admitting: Internal Medicine

## 2024-02-29 ENCOUNTER — Encounter: Payer: Self-pay | Admitting: Podiatry

## 2024-02-29 ENCOUNTER — Ambulatory Visit (INDEPENDENT_AMBULATORY_CARE_PROVIDER_SITE_OTHER): Admitting: Podiatry

## 2024-02-29 DIAGNOSIS — L600 Ingrowing nail: Secondary | ICD-10-CM

## 2024-02-29 DIAGNOSIS — L603 Nail dystrophy: Secondary | ICD-10-CM | POA: Diagnosis not present

## 2024-02-29 NOTE — Patient Instructions (Signed)

## 2024-02-29 NOTE — Progress Notes (Signed)
  Subjective:  Patient ID: Samantha Ball, female    DOB: 07-15-1976,   MRN: [id number]  No chief complaint on file.   48 y.o. female presents for concern of her right great toe. She has seen Dr. Ardelle Anton in the past and had the nail removed and cultures and negative for fungus. She relates it grew in the same she is here today to discuss permanently removing the toenail.  . Denies any other pedal complaints. Denies n/v/f/c.   Past Medical History:  Diagnosis Date   Anemia    Arthritis    oa   DM Type 2    diet controlled   Fibroids    Dr. Normand Sloop, per patient.    GERD (gastroesophageal reflux disease)    Hyperlipidemia    Hypertension    Internal hemorrhoid    Intrinsic eczema 04/01/2020   migraine    Obesity    Sleep apnea    not currently using CPAP did not tolerate cpap   Urticaria    Vitamin B deficiency    Wears glasses 11/17/2021   for reading   Wears hearing aid in both ears 11/17/2021    Objective:  Physical Exam: Vascular: DP/PT pulses 2/4 bilateral. CFT <3 seconds. Normal hair growth on digits. No edema.  Skin. No lacerations or abrasions bilateral feet. Right hallux nail thickened and dystrophic and tender to touch.  Musculoskeletal: MMT 5/5 bilateral lower extremities in DF, PF, Inversion and Eversion. Deceased ROM in DF of ankle joint.  Neurological: Sensation intact to light touch.   Assessment:   1. Onychodystrophy   2. Ingrown right greater toenail      Plan:  Patient was evaluated and treated and all questions answered. Discussed dystrophic/ingrown toenails etiology and treatment options including procedure for removal vs conservative care.  Patient requesting removal of dystrophic nail today. Procedure below.  Discussed procedure and post procedure care and patient expressed understanding.  Will follow-up in 2 weeks for nail check or sooner if any problems arise.    Procedure:  Procedure: total Nail Avulsion of right hallux nail Surgeon:  Louann Sjogren, DPM  Pre-op Dx:  Dystrophic t oenail without infection Post-op: Same  Place of Surgery: Office exam room.  Indications for surgery: Painful and dystrophic toenail.    The patient is requesting removal of nail with  chemical matrixectomy. Risks and complications were discussed with the patient for which they understand and written consent was obtained. Under sterile conditions a total of 3 mL of  1% lidocaine plain was infiltrated in a hallux block fashion. Once anesthetized, the skin was prepped in sterile fashion. A tourniquet was then applied. Next the entire hallux nail was removed.  Next phenol was then applied under standard conditions to permanently destroy the matrix and copiously irrigated. Silvadene was applied. A dry sterile dressing was applied. After application of the dressing the tourniquet was removed and there is found to be an immediate capillary refill time to the digit. The patient tolerated the procedure well without any complications. Post procedure instructions were discussed the patient for which he verbally understood. Follow-up in two weeks for nail check or sooner if any problems are to arise. Discussed signs/symptoms of infection and directed to call the office immediately should any occur or go directly to the emergency room. In the meantime, encouraged to call the office with any questions, concerns, changes symptoms.   Louann Sjogren, DPM

## 2024-03-01 ENCOUNTER — Other Ambulatory Visit: Payer: Self-pay | Admitting: Internal Medicine

## 2024-03-01 DIAGNOSIS — K2100 Gastro-esophageal reflux disease with esophagitis, without bleeding: Secondary | ICD-10-CM

## 2024-03-12 ENCOUNTER — Ambulatory Visit (INDEPENDENT_AMBULATORY_CARE_PROVIDER_SITE_OTHER): Admitting: Podiatry

## 2024-03-12 DIAGNOSIS — L603 Nail dystrophy: Secondary | ICD-10-CM

## 2024-03-12 DIAGNOSIS — L600 Ingrowing nail: Secondary | ICD-10-CM

## 2024-03-12 NOTE — Progress Notes (Signed)
  Subjective:  Patient ID: Samantha Ball, female    DOB: 03-10-1976,   MRN: [id number]  No chief complaint on file.   48 y.o. female presents for follow-up of permanent hallux nail avulsion. Relates doing well and soaking as instructed . Denies any other pedal complaints. Denies n/v/f/c.   Past Medical History:  Diagnosis Date   Anemia    Arthritis    oa   DM Type 2    diet controlled   Fibroids    Dr. Normand Sloop, per patient.    GERD (gastroesophageal reflux disease)    Hyperlipidemia    Hypertension    Internal hemorrhoid    Intrinsic eczema 04/01/2020   migraine    Obesity    Sleep apnea    not currently using CPAP did not tolerate cpap   Urticaria    Vitamin B deficiency    Wears glasses 11/17/2021   for reading   Wears hearing aid in both ears 11/17/2021    Objective:  Physical Exam: Vascular: DP/PT pulses 2/4 bilateral. CFT <3 seconds. Normal hair growth on digits. No edema.  Skin. No lacerations or abrasions bilateral feet. Right hallux nail bed healing well.  Musculoskeletal: MMT 5/5 bilateral lower extremities in DF, PF, Inversion and Eversion. Deceased ROM in DF of ankle joint.  Neurological: Sensation intact to light touch.   Assessment:   1. Ingrown right greater toenail   2. Onychodystrophy      Plan:  Patient was evaluated and treated and all questions answered. Toe was evaluated and appears to be healing well.  May discontinue soaks and neosporin.  Patient to follow-up as needed.    Louann Sjogren, DPM

## 2024-03-13 ENCOUNTER — Encounter: Payer: Self-pay | Admitting: Rheumatology

## 2024-03-13 DIAGNOSIS — L659 Nonscarring hair loss, unspecified: Secondary | ICD-10-CM

## 2024-03-14 NOTE — Telephone Encounter (Signed)
 Please refer patient to Dr. Langston Reusing [facility] dermatology for hair loss.

## 2024-04-11 ENCOUNTER — Encounter: Payer: Self-pay | Admitting: Internal Medicine

## 2024-04-11 ENCOUNTER — Ambulatory Visit: Payer: BC Managed Care – PPO | Admitting: Internal Medicine

## 2024-04-11 ENCOUNTER — Other Ambulatory Visit: Payer: Self-pay | Admitting: Internal Medicine

## 2024-04-11 VITALS — BP 118/76 | HR 71 | Temp 97.9°F | Resp 16 | Ht 65.0 in | Wt 174.2 lb

## 2024-04-11 DIAGNOSIS — T502X5A Adverse effect of carbonic-anhydrase inhibitors, benzothiadiazides and other diuretics, initial encounter: Secondary | ICD-10-CM

## 2024-04-11 DIAGNOSIS — Z7985 Long-term (current) use of injectable non-insulin antidiabetic drugs: Secondary | ICD-10-CM

## 2024-04-11 DIAGNOSIS — E538 Deficiency of other specified B group vitamins: Secondary | ICD-10-CM

## 2024-04-11 DIAGNOSIS — E876 Hypokalemia: Secondary | ICD-10-CM

## 2024-04-11 DIAGNOSIS — E785 Hyperlipidemia, unspecified: Secondary | ICD-10-CM

## 2024-04-11 DIAGNOSIS — Z1231 Encounter for screening mammogram for malignant neoplasm of breast: Secondary | ICD-10-CM

## 2024-04-11 DIAGNOSIS — E118 Type 2 diabetes mellitus with unspecified complications: Secondary | ICD-10-CM

## 2024-04-11 DIAGNOSIS — I10 Essential (primary) hypertension: Secondary | ICD-10-CM | POA: Diagnosis not present

## 2024-04-11 LAB — MICROALBUMIN / CREATININE URINE RATIO
Creatinine,U: 148.7 mg/dL
Microalb Creat Ratio: 8.6 mg/g (ref 0.0–30.0)
Microalb, Ur: 1.3 mg/dL (ref 0.0–1.9)

## 2024-04-11 LAB — URINALYSIS, ROUTINE W REFLEX MICROSCOPIC
Bilirubin Urine: NEGATIVE
Hgb urine dipstick: NEGATIVE
Ketones, ur: NEGATIVE
Leukocytes,Ua: NEGATIVE
Nitrite: NEGATIVE
Specific Gravity, Urine: 1.015 (ref 1.000–1.030)
Total Protein, Urine: NEGATIVE
Urine Glucose: NEGATIVE
Urobilinogen, UA: 0.2 (ref 0.0–1.0)
pH: 6 (ref 5.0–8.0)

## 2024-04-11 LAB — CBC WITH DIFFERENTIAL/PLATELET
Basophils Absolute: 0 10*3/uL (ref 0.0–0.1)
Basophils Relative: 0.6 % (ref 0.0–3.0)
Eosinophils Absolute: 0.1 10*3/uL (ref 0.0–0.7)
Eosinophils Relative: 2 % (ref 0.0–5.0)
HCT: 36 % (ref 36.0–46.0)
Hemoglobin: 12.1 g/dL (ref 12.0–15.0)
Lymphocytes Relative: 31.6 % (ref 12.0–46.0)
Lymphs Abs: 2 10*3/uL (ref 0.7–4.0)
MCHC: 33.8 g/dL (ref 30.0–36.0)
MCV: 85.8 fl (ref 78.0–100.0)
Monocytes Absolute: 0.4 10*3/uL (ref 0.1–1.0)
Monocytes Relative: 6.9 % (ref 3.0–12.0)
Neutro Abs: 3.8 10*3/uL (ref 1.4–7.7)
Neutrophils Relative %: 58.9 % (ref 43.0–77.0)
Platelets: 300 10*3/uL (ref 150.0–400.0)
RBC: 4.19 Mil/uL (ref 3.87–5.11)
RDW: 15.2 % (ref 11.5–15.5)
WBC: 6.4 10*3/uL (ref 4.0–10.5)

## 2024-04-11 LAB — LIPID PANEL
Cholesterol: 152 mg/dL (ref 0–200)
HDL: 45.5 mg/dL (ref >39.00)
LDL Cholesterol: 86 mg/dL (ref 0–99)
NonHDL: 106.24
Total CHOL/HDL Ratio: 3
Triglycerides: 99 mg/dL (ref 0.0–149.0)
VLDL: 19.8 mg/dL (ref 0.0–40.0)

## 2024-04-11 LAB — BASIC METABOLIC PANEL WITH GFR
BUN: 13 mg/dL (ref 6–23)
CO2: 33 meq/L — ABNORMAL HIGH (ref 19–32)
Calcium: 10 mg/dL (ref 8.4–10.5)
Chloride: 98 meq/L (ref 96–112)
Creatinine, Ser: 0.96 mg/dL (ref 0.40–1.20)
GFR: 70.03 mL/min (ref >60.00)
Glucose, Bld: 85 mg/dL (ref 70–99)
Potassium: 3 meq/L — ABNORMAL LOW (ref 3.5–5.1)
Sodium: 137 meq/L (ref 135–145)

## 2024-04-11 LAB — FOLATE: Folate: 10.5 ng/mL (ref >5.9)

## 2024-04-11 LAB — HEPATIC FUNCTION PANEL
ALT: 11 U/L (ref 0–35)
AST: 14 U/L (ref 0–37)
Albumin: 4.3 g/dL (ref 3.5–5.2)
Alkaline Phosphatase: 43 U/L (ref 39–117)
Bilirubin, Direct: 0.1 mg/dL (ref 0.0–0.3)
Total Bilirubin: 0.6 mg/dL (ref 0.2–1.2)
Total Protein: 7.6 g/dL (ref 6.0–8.3)

## 2024-04-11 LAB — HEMOGLOBIN A1C: Hgb A1c MFr Bld: 5.2 % (ref 4.6–6.5)

## 2024-04-11 LAB — VITAMIN B12: Vitamin B-12: >1537 pg/mL — ABNORMAL HIGH (ref 211–911)

## 2024-04-11 MED ORDER — POTASSIUM CHLORIDE ER 10 MEQ PO TBCR: 10.0000 meq | EXTENDED_RELEASE_TABLET | Freq: Two times a day (BID) | ORAL | 0 refills | Status: DC

## 2024-04-11 MED ORDER — NEBIVOLOL HCL 5 MG PO TABS: 5.0000 mg | ORAL_TABLET | Freq: Every day | ORAL | 1 refills | Status: AC

## 2024-04-11 NOTE — Patient Instructions (Signed)
 Hypertension, Adult High blood pressure (hypertension) is when the force of blood pumping through the arteries is too strong. The arteries are the blood vessels that carry blood from the heart throughout the body. Hypertension forces the heart to work harder to pump blood and may cause arteries to become narrow or stiff. Untreated or uncontrolled hypertension can lead to a heart attack, heart failure, a stroke, kidney disease, and other problems. A blood pressure reading consists of a higher number over a lower number. Ideally, your blood pressure should be below 120/80. The first ("top") number is called the systolic pressure. It is a measure of the pressure in your arteries as your heart beats. The second ("bottom") number is called the diastolic pressure. It is a measure of the pressure in your arteries as the heart relaxes. What are the causes? The exact cause of this condition is not known. There are some conditions that result in high blood pressure. What increases the risk? Certain factors may make you more likely to develop high blood pressure. Some of these risk factors are under your control, including: Smoking. Not getting enough exercise or physical activity. Being overweight. Having too much fat, sugar, calories, or salt (sodium) in your diet. Drinking too much alcohol. Other risk factors include: Having a personal history of heart disease, diabetes, high cholesterol, or kidney disease. Stress. Having a family history of high blood pressure and high cholesterol. Having obstructive sleep apnea. Age. The risk increases with age. What are the signs or symptoms? High blood pressure may not cause symptoms. Very high blood pressure (hypertensive crisis) may cause: Headache. Fast or irregular heartbeats (palpitations). Shortness of breath. Nosebleed. Nausea and vomiting. Vision changes. Severe chest pain, dizziness, and seizures. How is this diagnosed? This condition is diagnosed by  measuring your blood pressure while you are seated, with your arm resting on a flat surface, your legs uncrossed, and your feet flat on the floor. The cuff of the blood pressure monitor will be placed directly against the skin of your upper arm at the level of your heart. Blood pressure should be measured at least twice using the same arm. Certain conditions can cause a difference in blood pressure between your right and left arms. If you have a high blood pressure reading during one visit or you have normal blood pressure with other risk factors, you may be asked to: Return on a different day to have your blood pressure checked again. Monitor your blood pressure at home for 1 week or longer. If you are diagnosed with hypertension, you may have other blood or imaging tests to help your health care provider understand your overall risk for other conditions. How is this treated? This condition is treated by making healthy lifestyle changes, such as eating healthy foods, exercising more, and reducing your alcohol intake. You may be referred for counseling on a healthy diet and physical activity. Your health care provider may prescribe medicine if lifestyle changes are not enough to get your blood pressure under control and if: Your systolic blood pressure is above 130. Your diastolic blood pressure is above 80. Your personal target blood pressure may vary depending on your medical conditions, your age, and other factors. Follow these instructions at home: Eating and drinking  Eat a diet that is high in fiber and potassium, and low in sodium, added sugar, and fat. An example of this eating plan is called the DASH diet. DASH stands for Dietary Approaches to Stop Hypertension. To eat this way: Eat  plenty of fresh fruits and vegetables. Try to fill one half of your plate at each meal with fruits and vegetables. Eat whole grains, such as whole-wheat pasta, brown rice, or whole-grain bread. Fill about one  fourth of your plate with whole grains. Eat or drink low-fat dairy products, such as skim milk or low-fat yogurt. Avoid fatty cuts of meat, processed or cured meats, and poultry with skin. Fill about one fourth of your plate with lean proteins, such as fish, chicken without skin, beans, eggs, or tofu. Avoid pre-made and processed foods. These tend to be higher in sodium, added sugar, and fat. Reduce your daily sodium intake. Many people with hypertension should eat less than 1,500 mg of sodium a day. Do not drink alcohol if: Your health care provider tells you not to drink. You are pregnant, may be pregnant, or are planning to become pregnant. If you drink alcohol: Limit how much you have to: 0-1 drink a day for women. 0-2 drinks a day for men. Know how much alcohol is in your drink. In the [place] one drink equals one 12 oz bottle of beer (355 mL), one 5 oz glass of wine (148 mL), or one 1 oz glass of hard liquor (44 mL). Lifestyle  Work with your health care provider to maintain a healthy body weight or to lose weight. Ask what an ideal weight is for you. Get at least 30 minutes of exercise that causes your heart to beat faster (aerobic exercise) most days of the week. Activities may include walking, swimming, or biking. Include exercise to strengthen your muscles (resistance exercise), such as Pilates or lifting weights, as part of your weekly exercise routine. Try to do these types of exercises for 30 minutes at least 3 days a week. Do not use any products that contain nicotine or tobacco. These products include cigarettes, chewing tobacco, and vaping devices, such as e-cigarettes. If you need help quitting, ask your health care provider. Monitor your blood pressure at home as told by your health care provider. Keep all follow-up visits. This is important. Medicines Take over-the-counter and prescription medicines only as told by your health care provider. Follow directions carefully. Blood  pressure medicines must be taken as prescribed. Do not skip doses of blood pressure medicine. Doing this puts you at risk for problems and can make the medicine less effective. Ask your health care provider about side effects or reactions to medicines that you should watch for. Contact a health care provider if you: Think you are having a reaction to a medicine you are taking. Have headaches that keep coming back (recurring). Feel dizzy. Have swelling in your ankles. Have trouble with your vision. Get help right away if you: Develop a severe headache or confusion. Have unusual weakness or numbness. Feel faint. Have severe pain in your chest or abdomen. Vomit repeatedly. Have trouble breathing. These symptoms may be an emergency. Get help right away. Call 911. Do not wait to see if the symptoms will go away. Do not drive yourself to the hospital. Summary Hypertension is when the force of blood pumping through your arteries is too strong. If this condition is not controlled, it may put you at risk for serious complications. Your personal target blood pressure may vary depending on your medical conditions, your age, and other factors. For most people, a normal blood pressure is less than 120/80. Hypertension is treated with lifestyle changes, medicines, or a combination of both. Lifestyle changes include losing weight, eating a healthy,  low-sodium diet, exercising more, and limiting alcohol. This information is not intended to replace advice given to you by your health care provider. Make sure you discuss any questions you have with your health care provider. Document Revised: 10/13/2021 Document Reviewed: 10/13/2021 Elsevier Patient Education  2024 [employer].

## 2024-04-11 NOTE — Progress Notes (Unsigned)
 Subjective:  Patient ID: Samantha Ball, female    DOB: Jul 06, 1976  Age: 48 y.o. MRN: [id number]  CC: Hypertension, Hyperlipidemia, and Diabetes   HPI [name] presents for f/up -----  Discussed the use of AI scribe software for clinical note transcription with the patient, who gave verbal consent to proceed.  History of Present Illness   Samantha Ball is a 48 year old female with hypertension and diabetes who presents for a routine follow-up.  She experiences no weakness, dizziness, or lightheadedness. She is currently on Nebivolol  and Indapamide  for hypertension. Despite recent weight loss, she is not very active. She reports no chest pain or shortness of breath during physical activity and no swelling in her legs or feet.  She is being treated for diabetes with Mounjaro  and denies any side effects such as abdominal pain, nausea, vomiting, belching, burping, constipation, or diarrhea. She has a history of constipation but notes no recent changes.  She is a nonsmoker and does not consume alcohol.       Outpatient Medications Prior to Visit  Medication Sig Dispense Refill   BIOTIN 5000 PO Take 5,000 mcg by mouth daily.     Cold Sore Products (L-LYSINE EX)      Cranberry 400 MG CAPS Take 800 mg by mouth daily.     levocetirizine (XYZAL ) 5 MG tablet TAKE 1 TABLET BY MOUTH EVERY DAY IN THE EVENING 90 tablet 1   naproxen  (NAPROSYN ) 500 MG tablet TAKE 1 TABLET BY MOUTH TWICE A DAY WITH FOOD 60 tablet 3   omeprazole  (PRILOSEC) 40 MG capsule TAKE 1 CAPSULE (40 MG TOTAL) BY MOUTH DAILY. 90 capsule 0   Rimegepant Sulfate  (NURTEC) 75 MG TBDP Take 1 tablet by mouth every other day. (Patient taking differently: Take 1 tablet by mouth as needed.) 46 tablet 1   rosuvastatin  (CRESTOR ) 10 MG tablet Take 1 tablet (10 mg total) by mouth daily. 90 tablet 1   tirzepatide  (MOUNJARO ) 7.5 MG/0.5ML Pen Inject 7.5 mg into the skin once a week. 6 mL 1   Cyanocobalamin  (B-12) 1000 MCG CAPS       indapamide  (LOZOL ) 1.25 MG tablet Take 1 tablet (1.25 mg total) by mouth daily. 90 tablet 1   nebivolol  (BYSTOLIC ) 5 MG tablet Take 1 tablet (5 mg total) by mouth daily. 90 tablet 1   cholecalciferol  (VITAMIN D3) 25 MCG (1000 UNIT) tablet Take 1,000 Units by mouth as needed.     No facility-administered medications prior to visit.    ROS Review of Systems  Constitutional: Negative.  Negative for diaphoresis and fatigue.  HENT: Negative.    Eyes: Negative.   Respiratory:  Negative for cough, chest tightness, shortness of breath and wheezing.   Cardiovascular:  Negative for chest pain, palpitations and leg swelling.  Gastrointestinal:  Negative for abdominal pain, constipation, diarrhea, nausea and vomiting.  Endocrine: Negative.   Genitourinary: Negative.  Negative for difficulty urinating.  Musculoskeletal: Negative.   Skin: Negative.   Neurological: Negative.  Negative for dizziness and light-headedness.  Hematological:  Negative for adenopathy. Does not bruise/bleed easily.  Psychiatric/Behavioral: Negative.      Objective:  BP 118/76 (BP Location: Left Arm, Patient Position: Sitting, Cuff Size: Normal)   Pulse 71   Temp 97.9 F (36.6 C) (Oral)   Resp 16   Ht 5\' 5"  (1.651 m)   Wt 174 lb 3.2 oz (79 kg)   LMP 11/04/2021   SpO2 99%   BMI 28.99 kg/m   BP Readings  from Last 3 Encounters:  04/11/24 118/76  01/03/24 115/83  10/12/23 138/86    Wt Readings from Last 3 Encounters:  04/11/24 174 lb 3.2 oz (79 kg)  01/03/24 180 lb (81.6 kg)  10/12/23 187 lb 3.2 oz (84.9 kg)    Physical Exam Vitals reviewed.  Constitutional:      Appearance: Normal appearance.  HENT:     Mouth/Throat:     Mouth: Mucous membranes are moist.  Eyes:     General: No scleral icterus.    Conjunctiva/sclera: Conjunctivae normal.  Cardiovascular:     Rate and Rhythm: Normal rate and regular rhythm.     Heart sounds: No murmur heard.    No friction rub. No gallop.  Pulmonary:     Effort:  Pulmonary effort is normal.     Breath sounds: No stridor. No wheezing, rhonchi or rales.  Abdominal:     General: Abdomen is flat.     Palpations: There is no mass.     Tenderness: There is no abdominal tenderness. There is no guarding.     Hernia: No hernia is present.  Musculoskeletal:        General: Normal range of motion.     Cervical back: Neck supple.     Right lower leg: No edema.     Left lower leg: No edema.  Lymphadenopathy:     Cervical: No cervical adenopathy.  Skin:    General: Skin is warm and dry.  Neurological:     General: No focal deficit present.     Mental Status: She is alert.  Psychiatric:        Mood and Affect: Mood normal.        Behavior: Behavior normal.        Thought Content: Thought content normal.        Judgment: Judgment normal.     Lab Results  Component Value Date   WBC 6.4 04/11/2024   HGB 12.1 04/11/2024   HCT 36.0 04/11/2024   PLT 300.0 04/11/2024   GLUCOSE 85 04/11/2024   CHOL 152 04/11/2024   TRIG 99.0 04/11/2024   HDL 45.50 04/11/2024   LDLDIRECT 99.0 12/21/2016   LDLCALC 86 04/11/2024   ALT 11 04/11/2024   AST 14 04/11/2024   NA 137 04/11/2024   K 3.0 (L) 04/11/2024   CL 98 04/11/2024   CREATININE 0.96 04/11/2024   BUN 13 04/11/2024   CO2 33 (H) 04/11/2024   TSH 2.98 03/17/2023   HGBA1C 5.2 04/11/2024   MICROALBUR 1.3 04/11/2024    MM 3D SCREENING MAMMOGRAM BILATERAL BREAST Result Date: 03/30/2023 CLINICAL DATA:  Screening. EXAM: DIGITAL SCREENING BILATERAL MAMMOGRAM WITH TOMOSYNTHESIS AND CAD TECHNIQUE: Bilateral screening digital craniocaudal and mediolateral oblique mammograms were obtained. Bilateral screening digital breast tomosynthesis was performed. The images were evaluated with computer-aided detection. COMPARISON:  Previous exam(s). ACR Breast Density Category b: There are scattered areas of fibroglandular density. FINDINGS: There are no findings suspicious for malignancy. IMPRESSION: No mammographic evidence  of malignancy. A result letter of this screening mammogram will be mailed directly to the patient. RECOMMENDATION: Screening mammogram in one year. (Code:SM-B-01Y) BI-RADS CATEGORY  1: Negative. Electronically Signed   By: Amanda Jungling M.D.   On: 03/30/2023 11:12    Assessment & Plan:  Type II diabetes mellitus with manifestations (HCC) -     HM Diabetes Foot Exam -     Urinalysis, Routine w reflex microscopic; Future -     Microalbumin / creatinine urine ratio;  Future -     Basic metabolic panel with GFR; Future -     Hemoglobin A1c; Future  B12 deficiency -     CBC with Differential/Platelet; Future -     Folate; Future -     Vitamin B12; Future  Primary hypertension- Her BP is over-controlled and her K+ is low. Will discontinue the thiazide diuretic and start a K+ supplement. -     Urinalysis, Routine w reflex microscopic; Future -     Basic metabolic panel with GFR; Future -     Nebivolol  HCl; Take 1 tablet (5 mg total) by mouth daily.  Dispense: 90 tablet; Refill: 1  Hyperlipidemia LDL goal <130- LDL goal achieved. Doing well on the statin  -     Lipid panel; Future -     Hepatic function panel; Future  Diuretic-induced hypokalemia -     Potassium Chloride  ER; Take 1 tablet (10 mEq total) by mouth 2 (two) times daily.  Dispense: 180 tablet; Refill: 0     Follow-up: Return in about 6 months (around 10/11/2024).  Sandra Crouch, MD

## 2024-04-18 ENCOUNTER — Ambulatory Visit
Admission: RE | Admit: 2024-04-18 | Discharge: 2024-04-18 | Disposition: A | Discharge status: Home / Self Care | Source: Ambulatory Visit | Attending: Internal Medicine | Admitting: Internal Medicine

## 2024-04-18 DIAGNOSIS — Z1231 Encounter for screening mammogram for malignant neoplasm of breast: Secondary | ICD-10-CM | POA: Diagnosis not present

## 2024-04-21 ENCOUNTER — Other Ambulatory Visit: Payer: Self-pay | Admitting: Internal Medicine

## 2024-04-21 DIAGNOSIS — J3089 Other allergic rhinitis: Secondary | ICD-10-CM

## 2024-04-21 DIAGNOSIS — I10 Essential (primary) hypertension: Secondary | ICD-10-CM

## 2024-05-26 ENCOUNTER — Other Ambulatory Visit: Payer: Self-pay | Admitting: Internal Medicine

## 2024-05-26 DIAGNOSIS — K2100 Gastro-esophageal reflux disease with esophagitis, without bleeding: Secondary | ICD-10-CM

## 2024-05-28 ENCOUNTER — Other Ambulatory Visit: Payer: Self-pay | Admitting: Internal Medicine

## 2024-05-28 DIAGNOSIS — E785 Hyperlipidemia, unspecified: Secondary | ICD-10-CM

## 2024-06-01 ENCOUNTER — Other Ambulatory Visit: Payer: Self-pay | Admitting: Internal Medicine

## 2024-06-01 DIAGNOSIS — E118 Type 2 diabetes mellitus with unspecified complications: Secondary | ICD-10-CM

## 2024-06-18 NOTE — Progress Notes (Unsigned)
 Office Visit Note  Patient: Samantha Ball             Date of Birth: 1976/08/08           MRN: [id number]             PCP: Joshua Debby CROME, MD Referring: Joshua Debby CROME, MD Visit Date: 07/02/2024 Occupation: @GUAROCC @  Subjective:  Recent hair loss  History of Present Illness: Samantha Ball is a 48 y.o. female with history of osteoarthritis.  Patient states that overall she has been feeling better since her last office visit.  Patient states that her right hip pain has resolved since having a right trochanteric bursa cortisone injection performed on 01/03/2024.  Patient states that her energy level has been stable.  She experiences morning stiffness lasting for about 30 minutes daily.  She has not had any joint swelling.  Patient states that in March she had a recurrence of significant hair loss.  Patient is currently awaiting an appointment with Dr. Alm with [facility] dermatology for further evaluation and management.  Patient states that she has had some hair growth but is still concerned about the recurrence and severity of hair loss.  She has had intermittent facial rashes and occasionally notices photosensitivity.  She denies any other new or worsening symptoms.  She has not had any oral or nasal ulcerations.  She denies any sicca symptoms.  She denies any symptoms of Raynaud's phenomenon.   Activities of Daily Living:  Patient reports morning stiffness for 30 minutes.   Patient Denies nocturnal pain.  Difficulty dressing/grooming: Denies Difficulty climbing stairs: Denies Difficulty getting out of chair: Denies Difficulty using hands for taps, buttons, cutlery, and/or writing: Denies  Review of Systems  Constitutional:  Negative for fatigue.  HENT:  Negative for mouth sores and mouth dryness.   Eyes:  Negative for dryness.  Respiratory:  Negative for shortness of breath.   Cardiovascular:  Negative for chest pain and palpitations.  Gastrointestinal:  Positive for constipation.  Negative for blood in stool and diarrhea.  Endocrine: Negative for increased urination.  Genitourinary:  Negative for involuntary urination.  Musculoskeletal:  Positive for morning stiffness. Negative for joint pain, gait problem, joint pain, joint swelling, myalgias, muscle weakness, muscle tenderness and myalgias.  Skin:  Positive for hair loss. Negative for color change, rash and sensitivity to sunlight.  Allergic/Immunologic: Negative for susceptible to infections.  Neurological:  Positive for headaches. Negative for dizziness.  Hematological:  Negative for swollen glands.  Psychiatric/Behavioral:  Positive for sleep disturbance. Negative for depressed mood. The patient is not nervous/anxious.     PMFS History:  Patient Active Problem List   Diagnosis Date Noted   Diuretic-induced hypokalemia 04/11/2024   Cervical cancer screening 05/13/2021   Type II diabetes mellitus with manifestations (HCC) 05/13/2021   Onychomycosis 04/27/2021   Sensorineural hearing loss 04/27/2021   Migraine without aura and without status migrainosus, not intractable 11/17/2020   Routine general medical examination at a health care facility 04/01/2020   Intrinsic eczema 04/01/2020   Arthralgia 02/01/2019   Vitamin D  deficiency disease 11/21/2018   B12 deficiency 11/21/2018   Perennial allergic rhinitis 03/06/2018   Seasonal allergic rhinitis due to pollen 05/09/2017   Hyperlipidemia LDL goal <130 12/21/2016   Snoring 11/09/2016   GERD (gastroesophageal reflux disease) 01/09/2014   Positive ANA (antinuclear antibody) 07/16/2013   Essential hypertension, benign 07/11/2013   Visit for screening mammogram 07/11/2013   Obesity (BMI 30.0-34.9) 07/11/2013    Past  Medical History:  Diagnosis Date   Anemia    Arthritis    oa   DM Type 2    diet controlled   Fibroids    Dr. Armond, per patient.    GERD (gastroesophageal reflux disease)    Hyperlipidemia    Hypertension    Internal hemorrhoid     Intrinsic eczema 04/01/2020   migraine    Obesity    Sleep apnea    not currently using CPAP did not tolerate cpap   Urticaria    Vitamin B deficiency    Wears glasses 11/17/2021   for reading   Wears hearing aid in both ears 11/17/2021    Family History  Problem Relation Age of Onset   Heart disease Father    Allergic rhinitis Daughter    Eczema Daughter    Breast cancer Maternal Aunt    Diabetes Maternal Aunt    Breast cancer Maternal Grandmother    Breast cancer Cousin    Alcohol abuse Neg Hx    COPD Neg Hx    Depression Neg Hx    Drug abuse Neg Hx    Early death Neg Hx    Hearing loss Neg Hx    Hyperlipidemia Neg Hx    Hypertension Neg Hx    Kidney disease Neg Hx    Stroke Neg Hx    Colon cancer Neg Hx    Rectal cancer Neg Hx    Stomach cancer Neg Hx    Past Surgical History:  Procedure Laterality Date   colonscopy     x 2 last done 2021 or 2019   CYSTOSCOPY N/A 11/24/2021   Procedure: CYSTOSCOPY;  Surgeon: Cathlyn JAYSON Nikki Bobie FORBES, MD;  Location: [facility];  Service: Gynecology;  Laterality: N/A;   DILATION AND CURETTAGE OF UTERUS N/A 11/19/2000   TOTAL LAPAROSCOPIC HYSTERECTOMY WITH SALPINGECTOMY Bilateral 11/24/2021   Procedure: TOTAL LAPAROSCOPIC HYSTERECTOMY WITHBILATERAL SALPINGECTOMY,;  Surgeon: Cathlyn JAYSON Nikki Bobie FORBES, MD;  Location: [facility];  Service: Gynecology;  Laterality: Bilateral;   TUBAL LIGATION  2004   wisdom teeth  12/20/1998   Social History   Social History Narrative   Not on file   Immunization History  Administered Date(s) Administered   Influenza Inj Mdck Quad With Preservative 09/01/2022   Influenza, Seasonal, Injecte, Preservative Fre 10/12/2023   Influenza,inj,Quad PF,6+ Mos 02/04/2016, 11/09/2016, 01/18/2018, 11/20/2018, 09/12/2019, 11/17/2020, 09/17/2021   Influenza-Unspecified 01/09/2014   PFIZER(Purple Top)SARS-COV-2 Vaccination 03/20/2020, 04/17/2020, 01/29/2021   PNEUMOCOCCAL  CONJUGATE-20 09/17/2021   Tdap 04/25/2013, 03/17/2023     Objective: Vital Signs: BP 115/84 (BP Location: Left Arm, Patient Position: Sitting, Cuff Size: Small)   Pulse (!) 112   Resp 13   Ht 5' 4 (1.626 m)   Wt 174 lb 12.8 oz (79.3 kg)   LMP 11/04/2021   BMI 30.00 kg/m    Physical Exam Vitals and nursing note reviewed.  Constitutional:      Appearance: She is well-developed.  HENT:     Head: Normocephalic and atraumatic.  Eyes:     Conjunctiva/sclera: Conjunctivae normal.  Cardiovascular:     Rate and Rhythm: Normal rate and regular rhythm.     Heart sounds: Normal heart sounds.  Pulmonary:     Effort: Pulmonary effort is normal.     Breath sounds: Normal breath sounds.  Abdominal:     General: Bowel sounds are normal.     Palpations: Abdomen is soft.  Musculoskeletal:     Cervical  back: Normal range of motion.  Lymphadenopathy:     Cervical: No cervical adenopathy.  Skin:    General: Skin is warm and dry.     Capillary Refill: Capillary refill takes less than 2 seconds.  Neurological:     Mental Status: She is alert and oriented to person, place, and time.  Psychiatric:        Behavior: Behavior normal.      Musculoskeletal Exam: C-spine, thoracic spine, lumbar spine have good range of motion.  Shoulder joints, elbow joints, wrist joints, MCPs, PIPs, DIPs have good range of motion with no synovitis.  Complete fist formation bilaterally.  Hip joints have good range of motion.  No tenderness over the trochanteric bursa at this time.  Knee joints have good range of motion no warmth or effusion.  Ankle joints have good range of motion with no tenderness or joint swelling.  CDAI Exam: CDAI Score: -- Patient Global: --; Provider Global: -- Swollen: --; Tender: -- Joint Exam 07/02/2024   No joint exam has been documented for this visit   There is currently no information documented on the homunculus. Go to the Rheumatology activity and complete the homunculus joint  exam.  Investigation: No additional findings.  Imaging: No results found.  Recent Labs: Lab Results  Component Value Date   WBC 6.4 04/11/2024   HGB 12.1 04/11/2024   PLT 300.0 04/11/2024   NA 137 04/11/2024   K 3.0 (L) 04/11/2024   CL 98 04/11/2024   CO2 33 (H) 04/11/2024   GLUCOSE 85 04/11/2024   BUN 13 04/11/2024   CREATININE 0.96 04/11/2024   BILITOT 0.6 04/11/2024   ALKPHOS 43 04/11/2024   AST 14 04/11/2024   ALT 11 04/11/2024   PROT 7.6 04/11/2024   ALBUMIN 4.3 04/11/2024   CALCIUM  10.0 04/11/2024   GFRAA 103 06/26/2021    Speciality Comments: No specialty comments available.  Procedures:  No procedures performed Allergies: Amlodipine   Assessment / Plan:     Visit Diagnoses: Primary osteoarthritis of both hands: No tenderness or synovitis noted on examination today.  Complete fist formation bilaterally.  Trochanteric bursitis, right hip: Not currently symptomatic.  No tenderness upon palpation today. She had a right trochanteric bursa cortisone injection performed on 01/03/2024.  Primary osteoarthritis of both knees: She has good range of motion of both knee joints on examination today.  No warmth or effusion noted.  Sicca syndrome (HCC) - Positive ANA, ENA negative, C3-C4 normal.  Sicca symptoms manageable with over-the-counter products.  Plan to update the following lab work today for further evaluation.   Hair loss: Recurrent.  Exacerbated in March 2025.  She has started to notice some hair growth--Upcoming appointment scheduled with dermatologist Dr. Alm in November 2025.  Plan to obtain the following lab work today for further evaluation.  Positive ANA (antinuclear antibody) -ANA 1: 80 nuclear, fine speckled, Ro antibody negative, La antibody negative, ESR within normal limits, complements within normal limits, double-stranded DNA negative.  History of hair loss, facial rashes, and sicca symptoms.   Currently awaiting appointment with dermatology. Sicca  symptoms have been well-controlled with use of over-the-counter products.  No oral or nasal ulcerations. No Malar rash noted currently. Energy level stable. No synovitis noted on exam.  Overall she has been feeling better than she did previously.   Plan to obtain the following lab work today for further evaluation.   Plan: Sjogrens syndrome-A extractable nuclear antibody, Sjogrens syndrome-B extractable nuclear antibody, ANA, C3 and C4, Rheumatoid  factor, Sedimentation rate, Anti-DNA antibody, double-stranded, CBC with Differential/Platelet, Comprehensive metabolic panel with GFR, Urinalysis, Routine w reflex microscopic, Anti-Smith antibody, RNP Antibody  Other medical conditions are listed as follows:   Essential hypertension, benign: Blood pressure was 115/84 today in the office.  Hyperlipidemia LDL goal <130: She remains on Crestor  as prescribed.  History of diabetes mellitus  Other insomnia  Vitamin D  deficiency  B12 deficiency: Vitamin B12 >1537 on 04/11/24.    Orders: Orders Placed This Encounter  Procedures   Sjogrens syndrome-A extractable nuclear antibody   Sjogrens syndrome-B extractable nuclear antibody   ANA   C3 and C4   Rheumatoid factor   Sedimentation rate   Anti-DNA antibody, double-stranded   CBC with Differential/Platelet   Comprehensive metabolic panel with GFR   Urinalysis, Routine w reflex microscopic   Anti-Smith antibody   RNP Antibody   No orders of the defined types were placed in this encounter.   Follow-Up Instructions: Return in about 6 months (around 01/02/2025) for Osteoarthritis, +ANA.   Waddell CHRISTELLA Craze, PA-C  Note - This record has been created using Dragon software.  Chart creation errors have been sought, but may not always  have been located. Such creation errors do not reflect on  the standard of medical care.

## 2024-07-02 ENCOUNTER — Encounter: Payer: Self-pay | Admitting: Physician Assistant

## 2024-07-02 ENCOUNTER — Ambulatory Visit: Payer: BC Managed Care – PPO | Attending: Physician Assistant | Admitting: Physician Assistant

## 2024-07-02 VITALS — BP 115/84 | HR 112 | Resp 13 | Ht 64.0 in | Wt 174.8 lb

## 2024-07-02 DIAGNOSIS — I10 Essential (primary) hypertension: Secondary | ICD-10-CM | POA: Diagnosis not present

## 2024-07-02 DIAGNOSIS — M170 Bilateral primary osteoarthritis of knee: Secondary | ICD-10-CM

## 2024-07-02 DIAGNOSIS — E538 Deficiency of other specified B group vitamins: Secondary | ICD-10-CM

## 2024-07-02 DIAGNOSIS — M19042 Primary osteoarthritis, left hand: Secondary | ICD-10-CM

## 2024-07-02 DIAGNOSIS — E559 Vitamin D deficiency, unspecified: Secondary | ICD-10-CM

## 2024-07-02 DIAGNOSIS — Z8639 Personal history of other endocrine, nutritional and metabolic disease: Secondary | ICD-10-CM

## 2024-07-02 DIAGNOSIS — M3500 Sicca syndrome, unspecified: Secondary | ICD-10-CM

## 2024-07-02 DIAGNOSIS — M19041 Primary osteoarthritis, right hand: Secondary | ICD-10-CM

## 2024-07-02 DIAGNOSIS — M7061 Trochanteric bursitis, right hip: Secondary | ICD-10-CM | POA: Diagnosis not present

## 2024-07-02 DIAGNOSIS — E785 Hyperlipidemia, unspecified: Secondary | ICD-10-CM

## 2024-07-02 DIAGNOSIS — R768 Other specified abnormal immunological findings in serum: Secondary | ICD-10-CM

## 2024-07-02 DIAGNOSIS — L659 Nonscarring hair loss, unspecified: Secondary | ICD-10-CM

## 2024-07-02 DIAGNOSIS — G4709 Other insomnia: Secondary | ICD-10-CM

## 2024-07-03 ENCOUNTER — Ambulatory Visit: Payer: Self-pay | Admitting: Physician Assistant

## 2024-07-03 NOTE — Progress Notes (Signed)
 Creatinine is borderline elevated-1.03.  increase water  intake.  Avoid NSAIDs.   Calcium  is elevated-10.7-please see if PTH can be added.   CBC WNL ESR WNL UA normal RF negative

## 2024-07-03 NOTE — Progress Notes (Signed)
 It does not appear that the patient is taking a calcium  or vitamin D  supplement so she may want to follow up with PCP to have calcium  rechecked. She may require PTH

## 2024-07-04 LAB — CBC WITH DIFFERENTIAL/PLATELET
Absolute Lymphocytes: 2224 {cells}/uL (ref 850–3900)
Absolute Monocytes: 383 {cells}/uL (ref 200–950)
Basophils Absolute: 40 {cells}/uL (ref 0–200)
Basophils Relative: 0.6 %
Eosinophils Absolute: 152 {cells}/uL (ref 15–500)
Eosinophils Relative: 2.3 %
HCT: 38 % (ref 35.0–45.0)
Hemoglobin: 12.4 g/dL (ref 11.7–15.5)
MCH: 28.2 pg (ref 27.0–33.0)
MCHC: 32.6 g/dL (ref 32.0–36.0)
MCV: 86.6 fL (ref 80.0–100.0)
MPV: 10.8 fL (ref 7.5–12.5)
Monocytes Relative: 5.8 %
Neutro Abs: 3802 {cells}/uL (ref 1500–7800)
Neutrophils Relative %: 57.6 %
Platelets: 293 Thousand/uL (ref 140–400)
RBC: 4.39 Million/uL (ref 3.80–5.10)
RDW: 14.4 % (ref 11.0–15.0)
Total Lymphocyte: 33.7 %
WBC: 6.6 Thousand/uL (ref 3.8–10.8)

## 2024-07-04 LAB — URINALYSIS, ROUTINE W REFLEX MICROSCOPIC
Bilirubin Urine: NEGATIVE
Glucose, UA: NEGATIVE
Hgb urine dipstick: NEGATIVE
Ketones, ur: NEGATIVE
Leukocytes,Ua: NEGATIVE
Nitrite: NEGATIVE
Protein, ur: NEGATIVE
Specific Gravity, Urine: 1.016 (ref 1.001–1.035)
pH: 5.5 (ref 5.0–8.0)

## 2024-07-04 LAB — COMPREHENSIVE METABOLIC PANEL WITH GFR
AG Ratio: 1.4 (calc) (ref 1.0–2.5)
ALT: 11 U/L (ref 6–29)
AST: 14 U/L (ref 10–35)
Albumin: 4.5 g/dL (ref 3.6–5.1)
Alkaline phosphatase (APISO): 45 U/L (ref 31–125)
BUN/Creatinine Ratio: 11 (calc) (ref 6–22)
BUN: 11 mg/dL (ref 7–25)
CO2: 27 mmol/L (ref 20–32)
Calcium: 10.7 mg/dL — ABNORMAL HIGH (ref 8.6–10.2)
Chloride: 106 mmol/L (ref 98–110)
Creat: 1.03 mg/dL — ABNORMAL HIGH (ref 0.50–0.99)
Globulin: 3.2 g/dL (ref 1.9–3.7)
Glucose, Bld: 89 mg/dL (ref 65–99)
Potassium: 4.5 mmol/L (ref 3.5–5.3)
Sodium: 140 mmol/L (ref 135–146)
Total Bilirubin: 0.5 mg/dL (ref 0.2–1.2)
Total Protein: 7.7 g/dL (ref 6.1–8.1)
eGFR: 67 mL/min/1.73m2 (ref >=60)

## 2024-07-04 LAB — C3 AND C4
C3 Complement: 107 mg/dL (ref 83–193)
C4 Complement: 32 mg/dL (ref 15–57)

## 2024-07-04 LAB — ANTI-NUCLEAR AB-TITER (ANA TITER): ANA Titer 1: 1:160 {titer} — ABNORMAL HIGH

## 2024-07-04 LAB — SEDIMENTATION RATE: Sed Rate: 2 mm/h (ref 0–20)

## 2024-07-04 LAB — SJOGRENS SYNDROME-A EXTRACTABLE NUCLEAR ANTIBODY: SSA (Ro) (ENA) Antibody, IgG: <1 AI

## 2024-07-04 LAB — RHEUMATOID FACTOR: Rheumatoid fact SerPl-aCnc: <10 [IU]/mL (ref <14)

## 2024-07-04 LAB — ANA: Anti Nuclear Antibody (ANA): POSITIVE — AB

## 2024-07-04 LAB — ANTI-SMITH ANTIBODY: ENA SM Ab Ser-aCnc: <1 AI

## 2024-07-04 LAB — ANTI-DNA ANTIBODY, DOUBLE-STRANDED: ds DNA Ab: <1 [IU]/mL

## 2024-07-04 LAB — SJOGRENS SYNDROME-B EXTRACTABLE NUCLEAR ANTIBODY: SSB (La) (ENA) Antibody, IgG: <1 AI

## 2024-07-04 LAB — RNP ANTIBODY: Ribonucleic Protein(ENA) Antibody, IgG: <1 AI

## 2024-07-04 NOTE — Progress Notes (Signed)
 Ro and La antibodies negative.   dsDNA negative  Smith antibody and RNP negative

## 2024-07-05 NOTE — Progress Notes (Signed)
 ANA remains positive.  Rest of autoimmune workup is negative.  She should notify us  if she develops any new or worsening symptoms.

## 2024-07-13 ENCOUNTER — Other Ambulatory Visit: Payer: Self-pay | Admitting: Internal Medicine

## 2024-07-13 DIAGNOSIS — T502X5A Adverse effect of carbonic-anhydrase inhibitors, benzothiadiazides and other diuretics, initial encounter: Secondary | ICD-10-CM

## 2024-07-22 ENCOUNTER — Other Ambulatory Visit: Payer: Self-pay | Admitting: Medical Genetics

## 2024-07-25 ENCOUNTER — Encounter: Payer: Self-pay | Admitting: Internal Medicine

## 2024-07-25 ENCOUNTER — Ambulatory Visit: Admitting: Internal Medicine

## 2024-07-25 VITALS — BP 132/86 | HR 65 | Temp 97.7°F | Ht 64.0 in | Wt 174.8 lb

## 2024-07-25 DIAGNOSIS — K5904 Chronic idiopathic constipation: Secondary | ICD-10-CM | POA: Diagnosis not present

## 2024-07-25 DIAGNOSIS — E118 Type 2 diabetes mellitus with unspecified complications: Secondary | ICD-10-CM

## 2024-07-25 DIAGNOSIS — N951 Menopausal and female climacteric states: Secondary | ICD-10-CM | POA: Diagnosis not present

## 2024-07-25 LAB — FOLLICLE STIMULATING HORMONE: FSH: 48.7 m[IU]/mL

## 2024-07-25 LAB — PHOSPHORUS: Phosphorus: 2.9 mg/dL (ref 2.3–4.6)

## 2024-07-25 LAB — VITAMIN D 25 HYDROXY (VIT D DEFICIENCY, FRACTURES): VITD: 30.32 ng/mL (ref 30.00–100.00)

## 2024-07-25 LAB — MAGNESIUM: Magnesium: 2.4 mg/dL (ref 1.5–2.5)

## 2024-07-25 LAB — LUTEINIZING HORMONE: LH: 44.39 m[IU]/mL

## 2024-07-25 LAB — TSH: TSH: 1.73 u[IU]/mL (ref 0.35–5.50)

## 2024-07-25 LAB — HEMOGLOBIN A1C: Hgb A1c MFr Bld: 5.7 % (ref 4.6–6.5)

## 2024-07-25 MED ORDER — ESTRADIOL 0.075 MG/24HR TD PTWK: 0.0750 mg | MEDICATED_PATCH | TRANSDERMAL | 0 refills | Status: DC

## 2024-07-25 MED ORDER — TRULANCE 3 MG PO TABS
1.0000 | ORAL_TABLET | Freq: Every day | ORAL | 1 refills | Status: AC
Start: 2024-07-25 — End: ?

## 2024-07-25 NOTE — Patient Instructions (Signed)
 Hypercalcemia Hypercalcemia is when the level of calcium in a person's blood is above normal. The body needs calcium to make bones and keep them strong. Calcium also helps the muscles, nerves, brain, and heart work the way they should. Most of the calcium in the body is stored in the bones. There is also calcium in the blood. Hypercalcemia occurs when there is too much calcium in your blood. Calcium levels in the blood are regulated by hormones, kidneys, and the gastrointestinal tract.  Hypercalcemia can happen when calcium comes out of the bones, or when the kidneys are not able to remove calcium from the blood. Hypercalcemia can be mild or severe. What are the causes? There are many possible causes of hypercalcemia. Common causes of this condition include: Hyperparathyroidism. This is a condition in which the body produces too much parathyroid hormone. There are four parathyroid glands in your neck. These glands produce a chemical messenger (hormone) that helps the body absorb calcium from foods and helps your bones release calcium. Certain kinds of cancer. Less common causes of hypercalcemia include: Calcium and vitamin D dietary supplements. Chronic kidney disease. Hyperthyroidism. Severe dehydration. Being on bed rest or being inactive for a long time. Certain medicines. Infections. What increases the risk? You are more likely to develop this condition if: You are female. You are 49 years of age or older. You have a family history of hypercalcemia. What are the signs or symptoms? Mild hypercalcemia that starts slowly may not cause symptoms. Severe, sudden hypercalcemia is more likely to cause symptoms, such as: Being more thirsty than usual. Needing to urinate more often than usual. Abdominal pain. Nausea and vomiting. Constipation. Muscle pain, twitching, or weakness. Feeling very tired. How is this diagnosed?  Hypercalcemia is usually diagnosed with a blood test. You may also  have tests to help check what is causing this condition. Tests include imaging tests and more blood tests. How is this treated? Treatment for hypercalcemia depends on the cause. Treatment may include: Receiving fluids through an IV. Medicines. These can be used to: Keep calcium levels steady after receiving fluids (loop diuretics). Keep calcium in your bones (bisphosphonates). Lower the calcium level in your blood. Surgery to remove overactive parathyroid glands. A procedure that filters your blood to correct calcium levels (hemodialysis). Follow these instructions at home:  Take over-the-counter and prescription medicines only as told by your health care provider. Follow instructions from your health care provider about eating or drinking restrictions. Drink enough fluid to keep your urine pale yellow. Stay active. Weight-bearing exercise helps to keep calcium in your bones. Follow instructions from your health care provider about what type and level of exercise is safe for you. Keep all follow-up visits. This is important. Contact a health care provider if: You have a fever. Your heartbeat is irregular or very fast. You have changes in mood, memory, or personality. Get help right away if: You have severe abdominal pain. You have chest pain. You have trouble breathing. You become very confused and sleepy. You lose consciousness. These symptoms may represent a serious problem that is an emergency. Do not wait to see if the symptoms will go away. Get medical help right away. Call your local emergency services (911 in the [place] Do not drive yourself to the hospital. Summary Hypercalcemia is when the level of calcium in a person's blood is above normal. The body needs calcium to make bones and keep them strong. There are many possible causes of hypercalcemia, and treatment depends on  the cause. Take over-the-counter and prescription medicines only as told by your health care  provider. This information is not intended to replace advice given to you by your health care provider. Make sure you discuss any questions you have with your health care provider. Document Revised: 05/13/2021 Document Reviewed: 05/13/2021 Elsevier Patient Education  2024 [employer].

## 2024-07-25 NOTE — Progress Notes (Signed)
 Subjective:  Patient ID: Samantha Ball, female    DOB: 1976/10/13  Age: 48 y.o. MRN: [id number]  CC: Diabetes   HPI Samantha Ball presents for f/up ----  Discussed the use of AI scribe software for clinical note transcription with the patient, who gave verbal consent to proceed.  History of Present Illness Samantha Ball is a 48 year old female who presents with hair loss and joint pain.  She experiences hair thinning at the top of her head and across the front, although the front is growing back. She is unsure of the cause and has been washing her hair daily, but it still itches sometimes. She has previously seen a dermatologist for alopecia, but did not find the consultation helpful. She is unable to see a dermatologist again until November.  She has been experiencing increased joint pain since discontinuing naproxen  about two to three weeks ago. She reports that after blood tests performed by a rheumatologist, she was told she had high calcium  and something related to her kidneys, and was advised to avoid NSAIDs. She was advised to avoid NSAIDs. She took Tylenol  for pain management on the morning of the visit.  She experiences episodes of hot flashes, describing feeling hot and sweating followed by feeling cold. She is unsure if these are true hot flashes as she is usually cold. Her last menstrual cycle was three years ago following a hysterectomy.  She has a long-standing history of constipation, which she manages with Miralax occasionally. Her last bowel movement was on Saturday, which is typical for her. She sometimes experiences abdominal pain and cramping, and occasionally sees blood in her stool, which she attributes to hemorrhoids. Her last colonoscopy six years ago was normal except for internal hemorrhoids.    Outpatient Medications Prior to Visit  Medication Sig Dispense Refill   BIOTIN 5000 PO Take 5,000 mcg by mouth daily.     levocetirizine (XYZAL ) 5 MG tablet TAKE 1  TABLET BY MOUTH EVERY DAY IN THE EVENING 90 tablet 1   nebivolol  (BYSTOLIC ) 5 MG tablet Take 1 tablet (5 mg total) by mouth daily. 90 tablet 1   omeprazole  (PRILOSEC) 40 MG capsule TAKE 1 CAPSULE (40 MG TOTAL) BY MOUTH DAILY. 90 capsule 0   potassium chloride  (KLOR-CON ) 10 MEQ tablet TAKE 1 TABLET BY MOUTH 2 TIMES DAILY. 180 tablet 0   Rimegepant Sulfate  (NURTEC) 75 MG TBDP Take 1 tablet by mouth every other day. 46 tablet 1   rosuvastatin  (CRESTOR ) 10 MG tablet TAKE 1 TABLET BY MOUTH EVERY DAY 90 tablet 1   tirzepatide  (MOUNJARO ) 7.5 MG/0.5ML Pen INJECT 7.5 MG SUBCUTANEOUSLY WEEKLY 2 mL 5   Cold Sore Products (L-LYSINE EX)      Cranberry 400 MG CAPS Take 800 mg by mouth daily. (Patient not taking: Reported on 07/02/2024)     naproxen  (NAPROSYN ) 500 MG tablet TAKE 1 TABLET BY MOUTH TWICE A DAY WITH FOOD 60 tablet 3   No facility-administered medications prior to visit.    ROS Review of Systems  Constitutional:  Negative for appetite change, chills, diaphoresis, fatigue and fever.  HENT: Negative.    Eyes: Negative.   Respiratory: Negative.  Negative for cough, chest tightness, shortness of breath and wheezing.   Cardiovascular:  Negative for chest pain, palpitations and leg swelling.  Gastrointestinal:  Positive for constipation. Negative for abdominal pain, blood in stool, diarrhea, nausea and vomiting.  Genitourinary:  Negative for difficulty urinating.  Musculoskeletal:  Positive for arthralgias. Negative  for joint swelling and myalgias.  Neurological: Negative.  Negative for dizziness and weakness.  Hematological:  Negative for adenopathy. Does not bruise/bleed easily.  Psychiatric/Behavioral: Negative.      Objective:  BP 132/86 (BP Location: Left Arm, Patient Position: Sitting, Cuff Size: Normal)   Pulse 65   Temp 97.7 F (36.5 C) (Oral)   Ht 5' 4 (1.626 m)   Wt 174 lb 12.8 oz (79.3 kg)   LMP 11/04/2021   SpO2 99%   BMI 30.00 kg/m   BP Readings from Last 3 Encounters:   07/25/24 132/86  07/02/24 115/84  04/11/24 118/76    Wt Readings from Last 3 Encounters:  07/25/24 174 lb 12.8 oz (79.3 kg)  07/02/24 174 lb 12.8 oz (79.3 kg)  04/11/24 174 lb 3.2 oz (79 kg)    Physical Exam Vitals reviewed.  Constitutional:      Appearance: Normal appearance.  HENT:     Mouth/Throat:     Mouth: Mucous membranes are moist.  Eyes:     General: No scleral icterus.    Conjunctiva/sclera: Conjunctivae normal.  Cardiovascular:     Rate and Rhythm: Normal rate and regular rhythm.     Heart sounds: No murmur heard.    No friction rub. No gallop.  Pulmonary:     Effort: Pulmonary effort is normal.     Breath sounds: No stridor. No wheezing, rhonchi or rales.  Abdominal:     General: Abdomen is flat. Bowel sounds are normal. There is no distension.     Palpations: Abdomen is soft. There is no hepatomegaly, splenomegaly or mass.     Tenderness: There is no abdominal tenderness. There is no guarding or rebound.  Musculoskeletal:        General: Normal range of motion.     Cervical back: Neck supple.     Right lower leg: No edema.     Left lower leg: No edema.  Lymphadenopathy:     Cervical: No cervical adenopathy.  Skin:    General: Skin is warm and dry.  Neurological:     General: No focal deficit present.     Mental Status: She is alert. Mental status is at baseline.  Psychiatric:        Mood and Affect: Mood normal.        Behavior: Behavior normal.     Lab Results  Component Value Date   WBC 6.6 07/02/2024   HGB 12.4 07/02/2024   HCT 38.0 07/02/2024   PLT 293 07/02/2024   GLUCOSE 89 07/02/2024   CHOL 152 04/11/2024   TRIG 99.0 04/11/2024   HDL 45.50 04/11/2024   LDLDIRECT 99.0 12/21/2016   LDLCALC 86 04/11/2024   ALT 11 07/02/2024   AST 14 07/02/2024   NA 140 07/02/2024   K 4.5 07/02/2024   CL 106 07/02/2024   CREATININE 1.03 (H) 07/02/2024   BUN 11 07/02/2024   CO2 27 07/02/2024   TSH 1.73 07/25/2024   HGBA1C 5.7 07/25/2024    MICROALBUR 1.3 04/11/2024    MM 3D SCREENING MAMMOGRAM BILATERAL BREAST Result Date: 04/20/2024 CLINICAL DATA:  Screening. EXAM: DIGITAL SCREENING BILATERAL MAMMOGRAM WITH TOMOSYNTHESIS AND CAD TECHNIQUE: Bilateral screening digital craniocaudal and mediolateral oblique mammograms were obtained. Bilateral screening digital breast tomosynthesis was performed. The images were evaluated with computer-aided detection. COMPARISON:  Previous exam(s). ACR Breast Density Category b: There are scattered areas of fibroglandular density. FINDINGS: There are no findings suspicious for malignancy. IMPRESSION: No mammographic evidence of malignancy. A result  letter of this screening mammogram will be mailed directly to the patient. RECOMMENDATION: Screening mammogram in one year. (Code:SM-B-01Y) BI-RADS CATEGORY  1: Negative. Electronically Signed   By: Rosina Gelineau M.D.   On: 04/20/2024 15:52    Assessment & Plan:  Menopausal hot flushes -     TSH; Future -     Follicle stimulating hormone; Future -     Luteinizing hormone; Future -     Estradiol ; Place 1 patch (0.075 mg total) onto the skin once a week.  Dispense: 12 patch; Refill: 0  Chronic idiopathic constipation -     TSH; Future -     Magnesium ; Future -     PTH, intact and calcium ; Future -     Trulance ; Take 1 tablet (3 mg total) by mouth daily.  Dispense: 90 tablet; Refill: 1  Hypercalcemia- Repeat Ca++ is normal. -     VITAMIN D  25 Hydroxy (Vit-D Deficiency, Fractures); Future -     Magnesium ; Future -     Phosphorus; Future -     PTH, intact and calcium ; Future  Type II diabetes mellitus with manifestations (HCC) -     Hemoglobin A1c; Future     Follow-up: Return in about 6 months (around 01/25/2025).  Debby Molt, MD

## 2024-07-27 LAB — PTH, INTACT AND CALCIUM
Calcium: 10 mg/dL (ref 8.6–10.2)
PTH: 36 pg/mL (ref 16–77)

## 2024-07-28 ENCOUNTER — Ambulatory Visit: Payer: Self-pay | Admitting: Internal Medicine

## 2024-07-30 ENCOUNTER — Other Ambulatory Visit (HOSPITAL_COMMUNITY)

## 2024-08-06 ENCOUNTER — Other Ambulatory Visit (HOSPITAL_COMMUNITY)
Admission: RE | Admit: 2024-08-06 | Discharge: 2024-08-06 | Disposition: A | Discharge status: Home / Self Care | Payer: Self-pay | Source: Ambulatory Visit | Attending: Medical Genetics | Admitting: Medical Genetics

## 2024-08-14 LAB — GENECONNECT MOLECULAR SCREEN: Genetic Analysis Overall Interpretation: NEGATIVE

## 2024-08-30 ENCOUNTER — Other Ambulatory Visit: Payer: Self-pay | Admitting: Internal Medicine

## 2024-08-30 DIAGNOSIS — K2100 Gastro-esophageal reflux disease with esophagitis, without bleeding: Secondary | ICD-10-CM

## 2024-10-11 ENCOUNTER — Encounter: Payer: Self-pay | Admitting: Nurse Practitioner

## 2024-10-11 ENCOUNTER — Ambulatory Visit: Admitting: Internal Medicine

## 2024-10-11 ENCOUNTER — Other Ambulatory Visit: Payer: Self-pay | Admitting: Internal Medicine

## 2024-10-11 ENCOUNTER — Ambulatory Visit: Admitting: Nurse Practitioner

## 2024-10-11 VITALS — BP 122/88 | HR 78 | Ht 64.0 in | Wt 173.0 lb

## 2024-10-11 DIAGNOSIS — Z01419 Encounter for gynecological examination (general) (routine) without abnormal findings: Secondary | ICD-10-CM

## 2024-10-11 DIAGNOSIS — R351 Nocturia: Secondary | ICD-10-CM | POA: Insufficient documentation

## 2024-10-11 DIAGNOSIS — B3731 Acute candidiasis of vulva and vagina: Secondary | ICD-10-CM

## 2024-10-11 DIAGNOSIS — R8290 Unspecified abnormal findings in urine: Secondary | ICD-10-CM | POA: Diagnosis not present

## 2024-10-11 DIAGNOSIS — N951 Menopausal and female climacteric states: Secondary | ICD-10-CM | POA: Diagnosis not present

## 2024-10-11 DIAGNOSIS — Z1331 Encounter for screening for depression: Secondary | ICD-10-CM | POA: Diagnosis not present

## 2024-10-11 DIAGNOSIS — R350 Frequency of micturition: Secondary | ICD-10-CM | POA: Diagnosis not present

## 2024-10-11 MED ORDER — ESTRADIOL 0.075 MG/24HR TD PTWK: 0.0750 mg | MEDICATED_PATCH | TRANSDERMAL | 3 refills | Status: AC

## 2024-10-11 NOTE — Progress Notes (Unsigned)
 Samantha Ball 1976/03/16 [id number]   History:  48 y.o. G3P2 presents for annual exam. S/P 11/2021 TLH with bilateral salpingectomies for menorrhagia/fibroids. Started estradiol  patch in August for hot flashes, prescribed by PCP. Has noticed improvement. Complains of frequent urination, worse at night. Denies burning and urgency. Normal pap history. T2DM, HTN, HLD managed by PCP.   Gynecologic History Patient's last menstrual period was 11/04/2021.   Contraception/Family planning: status post hysterectomy Sexually active: Yes  Health Maintenance Last Pap: 09/08/2021. Results were: Normal neg HPV Last mammogram: 04/18/2024. Results were: Normal Last colonoscopy: 04/09/2018. Results were: Normal, 10-year recall Last Dexa: Not indicated     10/11/2024    4:17 PM  Depression screen PHQ 2/9  Decreased Interest 0  Down, Depressed, Hopeless 0  PHQ - 2 Score 0     Past medical history, past surgical history, family history and social history were all reviewed and documented in the EPIC chart. Married. 85 yo daughter. 70 yo son, living at home, working.   ROS:  A ROS was performed and pertinent positives and negatives are included.  Exam:  Vitals:   10/11/24 1611  BP: 122/88  Pulse: 78  SpO2: 98%  Weight: 173 lb (78.5 kg)  Height: 5' 4 (1.626 m)      Body mass index is 29.7 kg/m.  General appearance:  Normal Thyroid :  Symmetrical, normal in size, without palpable masses or nodularity. Respiratory  Auscultation:  Clear without wheezing or rhonchi Cardiovascular  Auscultation:  Regular rate, without rubs, murmurs or gallops  Edema/varicosities:  Not grossly evident Abdominal  Soft,nontender, without masses, guarding or rebound.  Liver/spleen:  No organomegaly noted  Hernia:  None appreciated  Skin  Inspection:  Grossly normal Breasts: Examined lying and sitting.   Right: Without masses, retractions, nipple discharge or axillary adenopathy.   Left: Without masses,  retractions, nipple discharge or axillary adenopathy. Pelvic: External genitalia:  no lesions              Urethra:  normal appearing urethra with no masses, tenderness or lesions              Bartholins and Skenes: normal                 Vagina: normal appearing vagina with normal color and discharge, no lesions              Cervix: absent Bimanual Exam:  Uterus:  absent              Adnexa: no mass, fullness, tenderness              Rectovaginal: Deferred              Anus:  large, non-bleeding hemorrhoid present  Dereck Keas, CMA present as chaperone.   UA negative (yeast present)  Assessment/Plan:  48 y.o. G3P2 for annual exam.   Well female exam with routine gynecological exam - Education provided on SBEs, importance of preventative screenings, current guidelines, high calcium  diet, regular exercise, and multivitamin daily. Labs with PCP.   Menopausal hot flushes - Plan: estradiol  (CLIMARA  - DOSED IN MG/24 HR) 0.075 mg/24hr patch weekly. Has noticed improvement and wants to continue.   Frequent urination at night - Plan: Urinalysis,Complete w/RFL Culture. Negative UA. Yeast present.   Vaginal candidiasis - Plan: fluconazole  (DIFLUCAN ) 150 MG tablet every 3 days x 2 doses.  Screening for cervical cancer - Normal Pap history. No longer screening per guidelines.   Screening for breast  cancer - Normal mammogram history.  Continue annual screenings.  Normal breast exam today.  Screening for colon cancer - 2019 colonoscopy. Will repeat at GI's recommended interval.   Return in about 1 year (around 10/11/2025) for Annual.     Annabella DELENA Shutter DNP, 7:38 AM 10/12/2024

## 2024-10-12 ENCOUNTER — Encounter: Payer: Self-pay | Admitting: Nurse Practitioner

## 2024-10-12 MED ORDER — FLUCONAZOLE 150 MG PO TABS: 150.0000 mg | ORAL_TABLET | ORAL | 0 refills | Status: AC

## 2024-10-13 LAB — URINALYSIS, COMPLETE W/RFL CULTURE
Bilirubin Urine: NEGATIVE
Casts: NONE SEEN /LPF
Crystals: NONE SEEN /HPF
Glucose, UA: NEGATIVE
Hgb urine dipstick: NEGATIVE
Ketones, ur: NEGATIVE
Leukocyte Esterase: NEGATIVE
Nitrites, Initial: NEGATIVE
Protein, ur: NEGATIVE
RBC / HPF: NONE SEEN /HPF (ref 0–2)
Specific Gravity, Urine: 1.015 (ref 1.001–1.035)
WBC, UA: NONE SEEN /HPF (ref 0–5)
pH: 5.5 (ref 5.0–8.0)

## 2024-10-13 LAB — URINE CULTURE
MICRO NUMBER:: 17138931
Result:: NO GROWTH
SPECIMEN QUALITY:: ADEQUATE

## 2024-10-13 LAB — CULTURE INDICATED

## 2024-10-15 ENCOUNTER — Ambulatory Visit: Payer: Self-pay | Admitting: Nurse Practitioner

## 2024-10-21 ENCOUNTER — Other Ambulatory Visit: Payer: Self-pay | Admitting: Internal Medicine

## 2024-10-21 DIAGNOSIS — J3089 Other allergic rhinitis: Secondary | ICD-10-CM

## 2024-10-23 ENCOUNTER — Ambulatory Visit: Admitting: Dermatology

## 2024-10-25 ENCOUNTER — Other Ambulatory Visit: Payer: Self-pay | Admitting: Internal Medicine

## 2024-10-25 DIAGNOSIS — E876 Hypokalemia: Secondary | ICD-10-CM

## 2024-12-01 ENCOUNTER — Other Ambulatory Visit: Payer: Self-pay | Admitting: Internal Medicine

## 2024-12-01 DIAGNOSIS — E785 Hyperlipidemia, unspecified: Secondary | ICD-10-CM

## 2024-12-01 DIAGNOSIS — K2100 Gastro-esophageal reflux disease with esophagitis, without bleeding: Secondary | ICD-10-CM

## 2024-12-18 ENCOUNTER — Other Ambulatory Visit: Payer: Self-pay | Admitting: Internal Medicine

## 2024-12-18 DIAGNOSIS — E118 Type 2 diabetes mellitus with unspecified complications: Secondary | ICD-10-CM

## 2024-12-25 NOTE — Progress Notes (Signed)
 "  Office Visit Note  Patient: Samantha Ball             Date of Birth: 1976-09-28           MRN: [id number]             PCP: Joshua Debby CROME, MD Referring: Joshua Debby CROME, MD Visit Date: 01/03/2025 Occupation: PROCUREMENT ANALYST  Subjective:  Hair loss  History of Present Illness: Samantha Ball is a 49 y.o. female with osteoarthritis and positive ANA returns today after her last visit in July 2025.  She states she continues to have some stiffness in her hands.  The right trochanteric bursa is not as painful.  She continues to have some dry eye symptoms.  She denies dry mouth symptoms.  She gives history of fatigue, hair loss and photosensitivity.  Her dermatology appointment was moved from November to March.    Activities of Daily Living:  Patient reports morning stiffness for 1 hour.   Patient Denies nocturnal pain.  Difficulty dressing/grooming: Denies Difficulty climbing stairs: Denies Difficulty getting out of chair: Denies Difficulty using hands for taps, buttons, cutlery, and/or writing: Denies  Review of Systems  Constitutional:  Positive for fatigue.  HENT:  Negative for mouth sores and mouth dryness.   Eyes:  Positive for dryness.  Respiratory:  Negative for shortness of breath.   Cardiovascular:  Negative for chest pain and palpitations.  Gastrointestinal:  Positive for constipation. Negative for blood in stool and diarrhea.  Endocrine: Positive for increased urination.  Genitourinary:  Negative for involuntary urination.  Musculoskeletal:  Positive for morning stiffness. Negative for joint pain, gait problem, joint pain, joint swelling, myalgias, muscle weakness, muscle tenderness and myalgias.  Skin:  Positive for hair loss and sensitivity to sunlight. Negative for color change and rash.  Allergic/Immunologic: Negative for susceptible to infections.  Neurological:  Positive for headaches. Negative for dizziness.  Hematological:  Negative for swollen glands.   Psychiatric/Behavioral:  Positive for sleep disturbance. Negative for depressed mood. The patient is not nervous/anxious.     PMFS History:  Patient Active Problem List   Diagnosis Date Noted   Frequent urination at night 10/11/2024   Menopausal hot flushes 07/25/2024   Chronic idiopathic constipation 07/25/2024   Hypercalcemia 07/25/2024   Diuretic-induced hypokalemia 04/11/2024   Cervical cancer screening 05/13/2021   Type II diabetes mellitus with manifestations (HCC) 05/13/2021   Onychomycosis 04/27/2021   Sensorineural hearing loss 04/27/2021   Migraine without aura and without status migrainosus, not intractable 11/17/2020   Routine general medical examination at a health care facility 04/01/2020   Intrinsic eczema 04/01/2020   Vitamin D  deficiency disease 11/21/2018   B12 deficiency 11/21/2018   Perennial allergic rhinitis 03/06/2018   Seasonal allergic rhinitis due to pollen 05/09/2017   Hyperlipidemia LDL goal <130 12/21/2016   Snoring 11/09/2016   GERD (gastroesophageal reflux disease) 01/09/2014   Positive ANA (antinuclear antibody) 07/16/2013   Essential hypertension, benign 07/11/2013   Visit for screening mammogram 07/11/2013   Obesity (BMI 30.0-34.9) 07/11/2013    Past Medical History:  Diagnosis Date   Anemia    Arthritis    oa   DM Type 2    diet controlled   Fibroids    Dr. Armond, per patient.    GERD (gastroesophageal reflux disease)    Hyperlipidemia    Hypertension    Internal hemorrhoid    Intrinsic eczema 04/01/2020   migraine    Obesity    Sleep apnea  not currently using CPAP did not tolerate cpap   Urticaria    Vitamin B deficiency    Wears glasses 11/17/2021   for reading   Wears hearing aid in both ears 11/17/2021    Family History  Problem Relation Age of Onset   Heart disease Father    Allergic rhinitis Daughter    Eczema Daughter    Breast cancer Maternal Aunt    Diabetes Maternal Aunt    Breast cancer Maternal  Grandmother    Breast cancer Cousin    Alcohol abuse Neg Hx    COPD Neg Hx    Depression Neg Hx    Drug abuse Neg Hx    Early death Neg Hx    Hearing loss Neg Hx    Hyperlipidemia Neg Hx    Hypertension Neg Hx    Kidney disease Neg Hx    Stroke Neg Hx    Colon cancer Neg Hx    Rectal cancer Neg Hx    Stomach cancer Neg Hx    Past Surgical History:  Procedure Laterality Date   colonscopy     x 2 last done 2021 or 2019   CYSTOSCOPY N/A 11/24/2021   Procedure: CYSTOSCOPY;  Surgeon: Cathlyn JAYSON Nikki Bobie FORBES, MD;  Location[facility];  Service: Gynecology;  Laterality: N/A;   DILATION AND CURETTAGE OF UTERUS N/A 11/19/2000   TOTAL LAPAROSCOPIC HYSTERECTOMY WITH SALPINGECTOMY Bilateral 11/24/2021   Procedure: TOTAL LAPAROSCOPIC HYSTERECTOMY WITHBILATERAL SALPINGECTOMY,;  Surgeon: Cathlyn JAYSON Nikki Bobie FORBES, MD;  Location: [facility];  Service: Gynecology;  Laterality: Bilateral;   TUBAL LIGATION  2004   wisdom teeth  12/20/1998   Social History[1] Social History   Social History Narrative   Not on file     Immunization History  Administered Date(s) Administered   Influenza Inj Mdck Quad With Preservative 09/01/2022   Influenza, Seasonal, Injecte, Preservative Fre 10/12/2023   Influenza,inj,Quad PF,6+ Mos 02/04/2016, 11/09/2016, 01/18/2018, 11/20/2018, 09/12/2019, 11/17/2020, 09/17/2021   Influenza-Unspecified 01/09/2014   PFIZER(Purple Top)SARS-COV-2 Vaccination 03/20/2020, 04/17/2020, 01/29/2021   PNEUMOCOCCAL CONJUGATE-20 09/17/2021   Tdap 04/25/2013, 03/17/2023     Objective: Vital Signs: BP 125/88   Pulse 70   Temp 97.7 F (36.5 C)   Resp 15   Ht 5' 3.5 (1.613 m)   Wt 178 lb 12.8 oz (81.1 kg)   LMP 11/04/2021   BMI 31.18 kg/m    Physical Exam Vitals and nursing note reviewed.  Constitutional:      Appearance: She is well-developed.  HENT:     Head: Normocephalic and atraumatic.  Eyes:     Conjunctiva/sclera: Conjunctivae  normal.  Cardiovascular:     Rate and Rhythm: Normal rate and regular rhythm.     Heart sounds: Normal heart sounds.  Pulmonary:     Effort: Pulmonary effort is normal.     Breath sounds: Normal breath sounds.  Abdominal:     General: Bowel sounds are normal.     Palpations: Abdomen is soft.  Musculoskeletal:     Cervical back: Normal range of motion.  Lymphadenopathy:     Cervical: No cervical adenopathy.  Skin:    General: Skin is warm and dry.     Capillary Refill: Capillary refill takes less than 2 seconds.     Comments: Hair thinning was noted to in bilateral temporal region and top of her head.  Neurological:     Mental Status: She is alert and oriented to person, place, and time.  Psychiatric:        Behavior: Behavior normal.      Musculoskeletal Exam: Cervical, thoracic and lumbar spine were in good range of motion.  There was no SI joint tenderness.  Shoulder joints, elbow joints, wrist joints, MCPs, PIPs and DIPs were in good range of motion with no synovitis.  Mild PIP and DIP thickening with no synovitis was noted.  Hip joints and knee joints were in good range of motion without any warmth swelling or effusion.  There was no tenderness over ankles or MTPs.   CDAI Exam: CDAI Score: -- Patient Global: --; Provider Global: -- Swollen: --; Tender: -- Joint Exam 01/03/2025   No joint exam has been documented for this visit   There is currently no information documented on the homunculus. Go to the Rheumatology activity and complete the homunculus joint exam.  Investigation: No additional findings.  Imaging: No results found.  Recent Labs: Lab Results  Component Value Date   WBC 6.6 07/02/2024   HGB 12.4 07/02/2024   PLT 293 07/02/2024   NA 140 07/02/2024   K 4.5 07/02/2024   CL 106 07/02/2024   CO2 27 07/02/2024   GLUCOSE 89 07/02/2024   BUN 11 07/02/2024   CREATININE 1.03 (H) 07/02/2024   BILITOT 0.5 07/02/2024   ALKPHOS 43 04/11/2024   AST 14  07/02/2024   ALT 11 07/02/2024   PROT 7.7 07/02/2024   ALBUMIN 4.3 04/11/2024   CALCIUM  10.0 07/25/2024   GFRAA 103 06/26/2021   July 02, 2024 ANA 1: 160 NS, dsDNA negative, Smith negative, RNP negative SSA negative, SSB negative, complements normal, RF negative, UA negative  Speciality Comments: No specialty comments available.  Procedures:  No procedures performed Allergies: Amlodipine   Assessment / Plan:     Visit Diagnoses: Primary osteoarthritis of both hands-she gives history of stiffness in her hands.  Bilateral PIP and DIP thickening with no synovitis was noted.  Joint protection was discussed.  Trochanteric bursitis, right hip -not symptomatic.  She had a right trochanteric bursa cortisone injection performed on 01/03/2024.  Primary osteoarthritis of both knees-she is intermittent discomfort.  No warmth swelling or effusion was noted.  Sicca syndrome - July 02, 2024 ANA 1: 160 NS, dsDNA negative, Smith negative, RNP negative SSA negative, SSB negative, complements normal, RF negative, UA negative.  Labs were reviewed with the patient.  She complains of fatigue, dry eyes, hair loss and and photosensitivity.  Hair loss - Recurrent.  Patient states hair loss started after her husband had COVID but she was not positive for COVID.  She she lost a lot of hair in the last year.  She had some recovery of the hair loss.  Temporal region filling was noted.  She still had hair loss on the top of her head.  The hair loss was exacerbated in March 2025.  Patient states her appointment with Dr. Alm was rescheduled to March 2026 from November 2025.  Her TSH was normal vitamin D  was low normal.  I advised her to take vitamin D  on a regular basis.  Also advised her to take multivitamin with iron.  Use of topical minoxidil was discussed.  Positive ANA (antinuclear antibody) - ANA 1: 80 nuclear, fine speckled, Ro antibody negative, La antibody negative, ESR within normal limits, complements WNL,  double-stranded DNA neg.  She does not meet the criteria for lupus.  I advised her to contact us  if she develops any new symptoms.  Will recheck labs next year.  Other medical problems listed as follows:  Essential hypertension, benign  Hyperlipidemia LDL goal <130  Other insomnia  History of diabetes mellitus  B12 deficiency  Vitamin D  deficiency  Orders: Orders Placed This Encounter  Procedures   Protein / creatinine ratio, urine   CBC with Differential/Platelet   Comprehensive metabolic panel with GFR   ANA   Anti-DNA antibody, double-stranded   C3 and C4   Sedimentation rate   No orders of the defined types were placed in this encounter.    Follow-Up Instructions: Return in about 1 year (around 01/03/2026) for +ANA.   Maya Nash, MD  Note - This record has been created using Animal nutritionist.  Chart creation errors have been sought, but may not always  have been located. Such creation errors do not reflect on  the standard of medical care.     [1]  Social History Tobacco Use   Smoking status: Never    Passive exposure: Never   Smokeless tobacco: Never  Vaping Use   Vaping status: Never Used  Substance Use Topics   Alcohol use: No   Drug use: No   "

## 2024-12-27 LAB — OPHTHALMOLOGY REPORT-SCANNED

## 2025-01-03 ENCOUNTER — Ambulatory Visit: Attending: Rheumatology | Admitting: Rheumatology

## 2025-01-03 ENCOUNTER — Encounter: Payer: Self-pay | Admitting: Rheumatology

## 2025-01-03 VITALS — BP 125/88 | HR 70 | Temp 97.7°F | Resp 15 | Ht 63.5 in | Wt 178.8 lb

## 2025-01-03 DIAGNOSIS — E785 Hyperlipidemia, unspecified: Secondary | ICD-10-CM | POA: Diagnosis not present

## 2025-01-03 DIAGNOSIS — Z8639 Personal history of other endocrine, nutritional and metabolic disease: Secondary | ICD-10-CM | POA: Diagnosis not present

## 2025-01-03 DIAGNOSIS — I10 Essential (primary) hypertension: Secondary | ICD-10-CM | POA: Diagnosis not present

## 2025-01-03 DIAGNOSIS — M170 Bilateral primary osteoarthritis of knee: Secondary | ICD-10-CM | POA: Diagnosis not present

## 2025-01-03 DIAGNOSIS — E538 Deficiency of other specified B group vitamins: Secondary | ICD-10-CM | POA: Diagnosis not present

## 2025-01-03 DIAGNOSIS — E559 Vitamin D deficiency, unspecified: Secondary | ICD-10-CM | POA: Diagnosis not present

## 2025-01-03 DIAGNOSIS — G4709 Other insomnia: Secondary | ICD-10-CM | POA: Diagnosis not present

## 2025-01-03 DIAGNOSIS — M3500 Sicca syndrome, unspecified: Secondary | ICD-10-CM | POA: Diagnosis not present

## 2025-01-03 DIAGNOSIS — M19041 Primary osteoarthritis, right hand: Secondary | ICD-10-CM

## 2025-01-03 DIAGNOSIS — L659 Nonscarring hair loss, unspecified: Secondary | ICD-10-CM

## 2025-01-03 DIAGNOSIS — M7061 Trochanteric bursitis, right hip: Secondary | ICD-10-CM

## 2025-01-03 DIAGNOSIS — R7689 Other specified abnormal immunological findings in serum: Secondary | ICD-10-CM

## 2025-01-03 DIAGNOSIS — M19042 Primary osteoarthritis, left hand: Secondary | ICD-10-CM

## 2025-01-03 NOTE — Patient Instructions (Signed)
 Standing Labs We placed an order today for your standing lab work.   Please have your standing labs drawn in January 2027  Please have your labs drawn 2 weeks prior to your appointment so that the provider can discuss your lab results at your appointment, if possible.  Please note that you may see your imaging and lab results in MyChart before we have reviewed them. We will contact you once all results are reviewed. Please allow our office up to 72 hours to thoroughly review all of the results before contacting the office for clarification of your results.  WALK-IN LAB HOURS  Monday through Thursday from 8:00 am - 4:30 pm and Friday from 8:00 am-12:00 pm.  Patients with office visits requiring labs will be seen before walk-in labs.  You may encounter longer than normal wait times. Please allow additional time. Wait times may be shorter on  Monday and Thursday afternoons.  We do not book appointments for walk-in labs. We appreciate your patience and understanding with our staff.   Labs are drawn by [facility]. Please bring your co-pay at the time of your lab draw.  You may receive a bill from [facility] for your lab work.  Please note if you are on Hydroxychloroquine and and an order has been placed for a Hydroxychloroquine level,  you will need to have it drawn 4 hours or more after your last dose.  If you wish to have your labs drawn at another location, please call the office 24 hours in advance so we can fax the orders.  The office is located at [address], [address], [place], [place] [address]   If you have any questions regarding directions or hours of operation,  please call [phone].   As a reminder, please drink plenty of water  prior to coming for your lab work. Thanks!

## 2025-01-18 ENCOUNTER — Encounter: Payer: Self-pay | Admitting: *Deleted

## 2025-01-18 NOTE — Progress Notes (Signed)
 Samantha Ball                                          MRN: [id number]   01/18/2025   The [facility] Quality Team Specialist reviewed this patient medical record for the purposes of chart review for care gap closure. The following were reviewed: abstraction for care gap closure-kidney health evaluation for diabetes:eGFR  and uACR.    [facility] Quality Team

## 2025-01-20 ENCOUNTER — Other Ambulatory Visit: Payer: Self-pay | Admitting: Internal Medicine

## 2025-01-28 ENCOUNTER — Ambulatory Visit: Admitting: Internal Medicine

## 2025-03-13 ENCOUNTER — Ambulatory Visit: Payer: Self-pay | Admitting: Dermatology

## 2025-10-14 ENCOUNTER — Ambulatory Visit: Admitting: Nurse Practitioner

## 2026-01-03 ENCOUNTER — Ambulatory Visit: Admitting: Rheumatology
